# Patient Record
Sex: Female | Born: 1985 | Race: Black or African American | Hispanic: No | Marital: Single | Smoking: Never smoker
Health system: Southern US, Community
[De-identification: ages and names within clinical notes are randomized; demographics above are authoritative.]

## PROBLEM LIST (undated history)

## (undated) ENCOUNTER — Inpatient Hospital Stay (HOSPITAL_COMMUNITY): Payer: Self-pay

## (undated) DIAGNOSIS — R7303 Prediabetes: Secondary | ICD-10-CM

## (undated) DIAGNOSIS — O09299 Supervision of pregnancy with other poor reproductive or obstetric history, unspecified trimester: Secondary | ICD-10-CM

## (undated) DIAGNOSIS — O1490 Unspecified pre-eclampsia, unspecified trimester: Secondary | ICD-10-CM

## (undated) DIAGNOSIS — D649 Anemia, unspecified: Secondary | ICD-10-CM

---

## 2014-02-08 DIAGNOSIS — O1490 Unspecified pre-eclampsia, unspecified trimester: Secondary | ICD-10-CM

## 2014-02-08 DIAGNOSIS — A491 Streptococcal infection, unspecified site: Secondary | ICD-10-CM

## 2016-02-09 DIAGNOSIS — O1490 Unspecified pre-eclampsia, unspecified trimester: Secondary | ICD-10-CM

## 2016-02-09 DIAGNOSIS — Z9289 Personal history of other medical treatment: Secondary | ICD-10-CM

## 2016-02-09 HISTORY — DX: Unspecified pre-eclampsia, unspecified trimester: O14.90

## 2016-02-09 HISTORY — DX: Personal history of other medical treatment: Z92.89

## 2016-08-06 DIAGNOSIS — O1490 Unspecified pre-eclampsia, unspecified trimester: Secondary | ICD-10-CM

## 2018-02-08 NOTE — L&D Delivery Note (Signed)
Patient is a 33 y.o. now G4P5 s/p NSVD at [redacted]w[redacted]d, who was admitted for SOL, mild PEC. TOLAC.  She progressed with augmentation (Pitocin, AROM) to complete and pushed 13 minutes to deliver.  Cord clamping delayed by several minutes then clamped by CNM and cut by sister of patient.  Placenta intact and spontaneous, bleeding moderate.  1st degree laceration repaired without difficulty.  Mom and baby stable prior to transfer to postpartum. She plans on breastfeeding. She requests BTL for birth control.  Delivery Note At 8:45 PM a viable and healthy female was delivered via VBAC, Spontaneous (Presentation: LOA).  APGAR: 8, 9; weight pending.   Placenta intact and spontaneous, bleeding moderate. 3VCord: with no complications  Anesthesia:  Epidural Episiotomy: None Lacerations: 1st degree Suture Repair: 3.0 vicryl Est. Blood Loss (mL):  224mL  Mom to postpartum.  Baby to Couplet care / Skin to Skin.  Lajean Manes CNM 06/01/2018, 9:22 PM

## 2018-03-06 ENCOUNTER — Encounter (HOSPITAL_COMMUNITY): Payer: Self-pay

## 2018-03-06 ENCOUNTER — Other Ambulatory Visit: Payer: Self-pay

## 2018-03-06 ENCOUNTER — Emergency Department (HOSPITAL_COMMUNITY)
Admission: EM | Admit: 2018-03-06 | Discharge: 2018-03-07 | Disposition: A | Discharge status: Home / Self Care | Payer: Medicaid Other | Source: Home / Self Care

## 2018-03-06 DIAGNOSIS — O9989 Other specified diseases and conditions complicating pregnancy, childbirth and the puerperium: Secondary | ICD-10-CM

## 2018-03-06 DIAGNOSIS — Z87891 Personal history of nicotine dependence: Secondary | ICD-10-CM | POA: Insufficient documentation

## 2018-03-06 DIAGNOSIS — G44209 Tension-type headache, unspecified, not intractable: Secondary | ICD-10-CM | POA: Insufficient documentation

## 2018-03-06 DIAGNOSIS — R109 Unspecified abdominal pain: Secondary | ICD-10-CM | POA: Insufficient documentation

## 2018-03-06 DIAGNOSIS — Z3689 Encounter for other specified antenatal screening: Secondary | ICD-10-CM | POA: Diagnosis not present

## 2018-03-06 DIAGNOSIS — Z5321 Procedure and treatment not carried out due to patient leaving prior to being seen by health care provider: Secondary | ICD-10-CM | POA: Insufficient documentation

## 2018-03-06 DIAGNOSIS — Z3A25 25 weeks gestation of pregnancy: Secondary | ICD-10-CM | POA: Insufficient documentation

## 2018-03-06 DIAGNOSIS — G44201 Tension-type headache, unspecified, intractable: Secondary | ICD-10-CM | POA: Diagnosis not present

## 2018-03-06 DIAGNOSIS — O26892 Other specified pregnancy related conditions, second trimester: Secondary | ICD-10-CM | POA: Insufficient documentation

## 2018-03-06 LAB — BASIC METABOLIC PANEL
Anion gap: 9 (ref 5–15)
BUN: 8 mg/dL (ref 6–20)
CO2: 23 mmol/L (ref 22–32)
Calcium: 9.4 mg/dL (ref 8.9–10.3)
Chloride: 104 mmol/L (ref 98–111)
Creatinine, Ser: 0.71 mg/dL (ref 0.44–1.00)
GFR calc Af Amer: >60 mL/min (ref >60)
Glucose, Bld: 89 mg/dL (ref 70–99)
POTASSIUM: 4 mmol/L (ref 3.5–5.1)
Sodium: 136 mmol/L (ref 135–145)

## 2018-03-06 LAB — CBC
HCT: 30.7 % — ABNORMAL LOW (ref 36.0–46.0)
Hemoglobin: 9.8 g/dL — ABNORMAL LOW (ref 12.0–15.0)
MCH: 31.3 pg (ref 26.0–34.0)
MCHC: 31.9 g/dL (ref 30.0–36.0)
MCV: 98.1 fL (ref 80.0–100.0)
NRBC: 0 % (ref 0.0–0.2)
PLATELETS: 228 10*3/uL (ref 150–400)
RBC: 3.13 MIL/uL — AB (ref 3.87–5.11)
RDW: 14.7 % (ref 11.5–15.5)
WBC: 7.1 10*3/uL (ref 4.0–10.5)

## 2018-03-06 LAB — HCG, QUANTITATIVE, PREGNANCY: hCG, Beta Chain, Quant, S: 11123 m[IU]/mL — ABNORMAL HIGH (ref <5)

## 2018-03-06 NOTE — ED Triage Notes (Signed)
Pt is [redacted] wks pregnant with EDD 06/18/2018. G4 P3.  Hx of preeclampsia in previous pregnancies. Complaining of contraction 8 minutes apart starting at 11am.  Over the last 2 hours has gotten progressively worse. A&Ox4. Just moved from [place] and does not have OB MD yet.

## 2018-03-06 NOTE — ED Notes (Signed)
Pt stated wanted to go to [facility] hospital instead of waiting for [facility] nurse and room to open up. This NT insisted pt to stay that I couldn't guarantee nothing will happen right after walking into the parking lot. Pt stated they still wanted to leave, armband cut off.

## 2018-03-07 ENCOUNTER — Inpatient Hospital Stay (HOSPITAL_COMMUNITY)
Admission: AD | Admit: 2018-03-07 | Discharge: 2018-03-07 | Disposition: A | Discharge status: Home / Self Care | Payer: Medicaid Other | Attending: Obstetrics & Gynecology | Admitting: Obstetrics & Gynecology

## 2018-03-07 ENCOUNTER — Encounter (HOSPITAL_COMMUNITY): Payer: Self-pay

## 2018-03-07 DIAGNOSIS — O26899 Other specified pregnancy related conditions, unspecified trimester: Secondary | ICD-10-CM

## 2018-03-07 DIAGNOSIS — G44201 Tension-type headache, unspecified, intractable: Secondary | ICD-10-CM

## 2018-03-07 DIAGNOSIS — O26892 Other specified pregnancy related conditions, second trimester: Secondary | ICD-10-CM

## 2018-03-07 DIAGNOSIS — R109 Unspecified abdominal pain: Secondary | ICD-10-CM

## 2018-03-07 DIAGNOSIS — Z3689 Encounter for other specified antenatal screening: Secondary | ICD-10-CM

## 2018-03-07 DIAGNOSIS — Z3A25 25 weeks gestation of pregnancy: Secondary | ICD-10-CM

## 2018-03-07 HISTORY — DX: Unspecified pre-eclampsia, unspecified trimester: O14.90

## 2018-03-07 LAB — COMPREHENSIVE METABOLIC PANEL
ALT: 11 U/L (ref 0–44)
AST: 14 U/L — ABNORMAL LOW (ref 15–41)
Albumin: 3.4 g/dL — ABNORMAL LOW (ref 3.5–5.0)
Alkaline Phosphatase: 63 U/L (ref 38–126)
Anion gap: 6 (ref 5–15)
BUN: 9 mg/dL (ref 6–20)
CO2: 23 mmol/L (ref 22–32)
Calcium: 9 mg/dL (ref 8.9–10.3)
Chloride: 104 mmol/L (ref 98–111)
Creatinine, Ser: 0.69 mg/dL (ref 0.44–1.00)
GFR calc Af Amer: >60 mL/min (ref >60)
GFR calc non Af Amer: >60 mL/min (ref >60)
Glucose, Bld: 89 mg/dL (ref 70–99)
Potassium: 3.9 mmol/L (ref 3.5–5.1)
Sodium: 133 mmol/L — ABNORMAL LOW (ref 135–145)
Total Bilirubin: 0.6 mg/dL (ref 0.3–1.2)
Total Protein: 6.9 g/dL (ref 6.5–8.1)

## 2018-03-07 LAB — URINALYSIS, ROUTINE W REFLEX MICROSCOPIC
Bilirubin Urine: NEGATIVE
Glucose, UA: NEGATIVE mg/dL
Hgb urine dipstick: NEGATIVE
Ketones, ur: NEGATIVE mg/dL
Leukocytes, UA: NEGATIVE
Nitrite: NEGATIVE
Protein, ur: NEGATIVE mg/dL
Specific Gravity, Urine: 1.013 (ref 1.005–1.030)
pH: 6 (ref 5.0–8.0)

## 2018-03-07 MED ORDER — ACETAMINOPHEN 500 MG PO TABS
1000.0000 mg | ORAL_TABLET | Freq: Once | ORAL | Status: AC
  Administered 2018-03-07: 1000 mg via ORAL
  Filled 2018-03-07: qty 2

## 2018-03-07 MED ORDER — FERROUS SULFATE 325 (65 FE) MG PO TABS: 325.0000 mg | ORAL_TABLET | Freq: Every day | ORAL | 0 refills | Status: DC

## 2018-03-07 MED ORDER — NIFEDIPINE 10 MG PO CAPS
10.0000 mg | ORAL_CAPSULE | Freq: Once | ORAL | Status: AC
  Administered 2018-03-07: 10 mg via ORAL
  Filled 2018-03-07: qty 1

## 2018-03-07 MED ORDER — BUTALBITAL-APAP-CAFFEINE 50-325-40 MG PO TABS: 1.0000 | ORAL_TABLET | Freq: Four times a day (QID) | ORAL | 0 refills | Status: DC | PRN

## 2018-03-07 NOTE — MAU Provider Note (Signed)
History     CSN: [id number]  Arrival date and time: 03/07/18 7124   First Provider Initiated Contact with Patient 03/07/18 0041      Chief Complaint  Patient presents with  . Contractions  . Headache   Jennifer Booth is a 33 y.o. G3P3 at [redacted]w[redacted]d who presents to MAU with complaints of cramping and HA. She reports cramping started occurring this morning but has continued to increase over the day and she is unsure of whether cramping is contractions, rates pain 6/10- Has taken 500mg  of Tylenol around 2300 with no relief. She reports HA around 1900 with was associated with one occurrence of dizziness, rates pain 7/10. Hx of C/S with last pregnancy for twins- plans TOLAC. Receives prenatal care in [place], recently moved to [place] and has not started care. Denies vaginal bleeding or discharge. +FM. Was seen at [facility] but left to come to [facility].    OB History    Gravida  3   Para  2   Term  1   Preterm  1   AB  0   Living  3     SAB  0   TAB  0   Ectopic  0   Multiple  1   Live Births  3           Past Medical History:  Diagnosis Date  . Pre-eclampsia     Past Surgical History:  Procedure Laterality Date  . CESAREAN SECTION      No family history on file.  Social History   Tobacco Use  . Smoking status: Former Research scientist (life sciences)  . Smokeless tobacco: Never Used  Substance Use Topics  . Alcohol use: Not Currently  . Drug use: Never    Allergies:  Allergies  Allergen Reactions  . Zofran [Ondansetron Hcl] Anaphylaxis  . Ciprofloxacin     Medications Prior to Admission  Medication Sig Dispense Refill Last Dose  . acetaminophen (TYLENOL) 325 MG tablet Take 650 mg by mouth every 6 (six) hours as needed.   03/06/2018 at [place]  . Prenatal Vit-Fe Fumarate-FA (PRENATAL MULTIVITAMIN) TABS tablet Take 1 tablet by mouth daily at 12 noon.   03/06/2018 at Unknown time    Review of Systems  Constitutional: Negative.   Respiratory: Negative.   Cardiovascular:  Negative.   Gastrointestinal: Positive for abdominal pain. Negative for constipation, diarrhea, nausea and vomiting.  Genitourinary: Negative.   Neurological: Positive for dizziness and headaches. Negative for syncope, weakness and light-headedness.   Physical Exam   Blood pressure 105/65, pulse 87, temperature 98.4 F (36.9 C), temperature source Oral, resp. rate 20, height 5\' 5"  (1.651 m), weight 118.8 kg, SpO2 100 %.  Physical Exam  Nursing note and vitals reviewed. Constitutional: She is oriented to person, place, and time. She appears well-developed and well-nourished. No distress.  Cardiovascular: Normal rate, regular rhythm and normal heart sounds.  No murmur heard. Respiratory: Effort normal and breath sounds normal. No respiratory distress. She has no wheezes. She has no rales.  GI: Soft. She exhibits no distension. There is no abdominal tenderness. There is no rebound.  Gravid appropriate for gestational age   Musculoskeletal: Normal range of motion.        General: No edema.  Neurological: She is alert and oriented to person, place, and time.  Psychiatric: She has a normal mood and affect. Her behavior is normal. Thought content normal.   Dilation: Fingertip Effacement (%): Thick Cervical Position: Posterior Exam by:: Wende Bushy, CNM  FHR: 140/ moderate/ +accels/ no decelerations  Toco: no UC with some UI  MAU Course  Procedures  MDM Orders Placed This Encounter  Procedures  . Urinalysis, Routine w reflex microscopic  . Comprehensive metabolic panel   Meds ordered this encounter  Medications  . acetaminophen (TYLENOL) tablet 1,000 mg  . NIFEdipine (PROCARDIA) capsule 10 mg  . ferrous sulfate 325 (65 FE) MG tablet    Sig: Take 1 tablet (325 mg total) by mouth daily.    Dispense:  30 tablet    Refill:  0    Order Specific Question:   Supervising Provider    Answer:   Verita Schneiders A [[id number]]  . butalbital-acetaminophen-caffeine (FIORICET, ESGIC) 50-325-40 MG  tablet    Sig: Take 1-2 tablets by mouth every 6 (six) hours as needed for headache.    Dispense:  20 tablet    Refill:  0    Order Specific Question:   Supervising Provider    Answer:   Osborne Oman [[id number]]   Results for orders placed or performed during the hospital encounter of 03/07/18 (from the past 48 hour(s))  Urinalysis, Routine w reflex microscopic     Status: None   Collection Time: 03/07/18 12:17 AM  Result Value Ref Range   Color, Urine YELLOW YELLOW   APPearance CLEAR CLEAR   Specific Gravity, Urine 1.013 1.005 - 1.030   pH 6.0 5.0 - 8.0   Glucose, UA NEGATIVE NEGATIVE mg/dL   Hgb urine dipstick NEGATIVE NEGATIVE   Bilirubin Urine NEGATIVE NEGATIVE   Ketones, ur NEGATIVE NEGATIVE mg/dL   Protein, ur NEGATIVE NEGATIVE mg/dL   Nitrite NEGATIVE NEGATIVE   Leukocytes, UA NEGATIVE NEGATIVE    Comment: Performed at [facility], [address] [place], [place] [address]  Comprehensive metabolic panel     Status: Abnormal   Collection Time: 03/07/18 12:59 AM  Result Value Ref Range   Sodium 133 (L) 135 - 145 mmol/L   Potassium 3.9 3.5 - 5.1 mmol/L   Chloride 104 98 - 111 mmol/L   CO2 23 22 - 32 mmol/L   Glucose, Bld 89 70 - 99 mg/dL   BUN 9 6 - 20 mg/dL   Creatinine, Ser 0.69 0.44 - 1.00 mg/dL   Calcium 9.0 8.9 - 10.3 mg/dL   Total Protein 6.9 6.5 - 8.1 g/dL   Albumin 3.4 (L) 3.5 - 5.0 g/dL   AST 14 (L) 15 - 41 U/L   ALT 11 0 - 44 U/L   Alkaline Phosphatase 63 38 - 126 U/L   Total Bilirubin 0.6 0.3 - 1.2 mg/dL   GFR calc non Af Amer >60 >60 mL/min   GFR calc Af Amer >60 >60 mL/min   Anion gap 6 5 - 15    Comment: Performed at [facility], [address], [place], [place] [address]   CBC done at [facility] reviewed: hgb 9.8 Treatments in MAU included Tylenol 1000mg  and one dose of Procardia, patient drove self to hospital unable to give additional medication for HA.   Patient reports pain is relieved with medication treatment, reports having a hx of HA  prior to getting pregnant. Rx for fioricet and iron supplementation sent to pharmacy of choice. No uterine contractions noted on monitor. NST reactive for gestational age.  Educated and discussed reasons to return to MAU, message sent to [facility] to get scheduled for NOB/transfer appointment ASAP. Pt stable at time of discharge.  Assessment and Plan   1. Acute intractable tension-type headache  2. Abdominal cramping affecting pregnancy, antepartum   3. [redacted] weeks gestation of pregnancy   4. NST (non-stress test) reactive    Discharge home  Make appointment at [facility]- message sent  Return to MAU as needed Rx for iron supplementation and Fioricet   Follow-up [place]. Schedule an appointment as soon as possible for a visit.   Specialty:  Obstetrics and Gynecology Why:  Make appointment to be seen for prenatal care Contact information: [address], [place] [phone]         Allergies as of 03/07/2018      Reactions   Zofran Alvis Lemmings Hcl] Anaphylaxis   Ciprofloxacin       Medication List    TAKE these medications   acetaminophen 325 MG tablet Commonly known as:  TYLENOL Take 650 mg by mouth every 6 (six) hours as needed.   butalbital-acetaminophen-caffeine 50-325-40 MG tablet Commonly known as:  FIORICET, ESGIC Take 1-2 tablets by mouth every 6 (six) hours as needed for headache.   ferrous sulfate 325 (65 FE) MG tablet Take 1 tablet (325 mg total) by mouth daily.   prenatal multivitamin Tabs tablet Take 1 tablet by mouth daily at 12 noon.       Lajean Manes CNM 03/07/2018, 1:59 AM

## 2018-03-07 NOTE — Discharge Instructions (Signed)

## 2018-03-07 NOTE — MAU Note (Addendum)
Pt reports mild cramping that started this morning that has progressively worsened throughout the day. Reports contractions every 11 mins. Pt also reports HA and dizziness that started around 1900. Took tylenol for HA at 2030-did not help. History of pre-e with her last pregnancy. Pt denies LOF or vaginal bleeding. Reports good fetal movement. PNC received in [place]. Just moved here.

## 2018-03-08 ENCOUNTER — Telehealth: Payer: Self-pay | Admitting: *Deleted

## 2018-03-08 NOTE — Telephone Encounter (Signed)
Spoke with Patient's sister who stated she would pass along the message to sister about needing an appointment.

## 2018-03-16 ENCOUNTER — Telehealth: Payer: Self-pay | Admitting: Certified Nurse Midwife

## 2018-03-16 NOTE — Telephone Encounter (Signed)
Second attempt to reach patient to schedule prenatal care.  Patient is not in, and does not have voicemail.  Will continue to try back later to reach patient. Phone is a working number.

## 2018-03-25 ENCOUNTER — Encounter (HOSPITAL_COMMUNITY): Payer: Self-pay | Admitting: *Deleted

## 2018-03-25 ENCOUNTER — Inpatient Hospital Stay (HOSPITAL_COMMUNITY)
Admission: AD | Admit: 2018-03-25 | Discharge: 2018-03-25 | Disposition: A | Discharge status: Home / Self Care | Payer: Medicaid Other | Attending: Obstetrics and Gynecology | Admitting: Obstetrics and Gynecology

## 2018-03-25 DIAGNOSIS — Z87891 Personal history of nicotine dependence: Secondary | ICD-10-CM | POA: Diagnosis not present

## 2018-03-25 DIAGNOSIS — O4702 False labor before 37 completed weeks of gestation, second trimester: Secondary | ICD-10-CM

## 2018-03-25 DIAGNOSIS — Z3A27 27 weeks gestation of pregnancy: Secondary | ICD-10-CM | POA: Diagnosis not present

## 2018-03-25 DIAGNOSIS — R102 Pelvic and perineal pain: Secondary | ICD-10-CM | POA: Insufficient documentation

## 2018-03-25 DIAGNOSIS — O26892 Other specified pregnancy related conditions, second trimester: Secondary | ICD-10-CM | POA: Diagnosis not present

## 2018-03-25 DIAGNOSIS — M545 Low back pain: Secondary | ICD-10-CM

## 2018-03-25 LAB — URINALYSIS, ROUTINE W REFLEX MICROSCOPIC
Glucose, UA: NEGATIVE mg/dL
Ketones, ur: 15 mg/dL — AB
Nitrite: NEGATIVE
Protein, ur: 100 mg/dL — AB
Specific Gravity, Urine: 1.025 (ref 1.005–1.030)
pH: 6.5 (ref 5.0–8.0)

## 2018-03-25 LAB — URINALYSIS, MICROSCOPIC (REFLEX)

## 2018-03-25 MED ORDER — CYCLOBENZAPRINE HCL 10 MG PO TABS
10.0000 mg | ORAL_TABLET | Freq: Once | ORAL | Status: DC
  Filled 2018-03-25: qty 1

## 2018-03-25 MED ORDER — COMFORT FIT MATERNITY SUPP LG MISC: 1.0000 [IU] | Freq: Every day | 0 refills | Status: DC

## 2018-03-25 MED ORDER — CYCLOBENZAPRINE HCL 10 MG PO TABS: 10.0000 mg | ORAL_TABLET | Freq: Two times a day (BID) | ORAL | 0 refills | Status: DC | PRN

## 2018-03-25 MED ORDER — NIFEDIPINE 10 MG PO CAPS
10.0000 mg | ORAL_CAPSULE | ORAL | Status: AC | PRN
  Administered 2018-03-25 (×4): 10 mg via ORAL
  Filled 2018-03-25 (×4): qty 1

## 2018-03-25 NOTE — Discharge Instructions (Signed)

## 2018-03-25 NOTE — MAU Note (Signed)
Pt states she started having some contractions and lower back pain that started three hours ago, denies LOF.VB. No recent intercourse. Tried lying down, changing positions, drinking water but it did not help.

## 2018-03-25 NOTE — MAU Provider Note (Signed)
History     CSN: [id number]  Arrival date and time: 03/25/18 2035   First Provider Initiated Contact with Patient 03/25/18 2110      Chief Complaint  Patient presents with  . Contractions   Avalin Booth is a 33 y.o. G3P3 at [redacted]w[redacted]d who presents to MAU with complaints of contractions and back pain. She reports contractions and back pain started occurring 3 hours prior to arrival to MAU. She reports contractions occur every 5-10 minutes, started off as mild contractions then increased in intensity. Pain starts in abdomen then radiates around to her lower back. Rates pain 5/10- has not taken any medication for pain prior to presenting to MAU. Denies vaginal bleeding or discharge. +FM. Denies IC recently. Patient reports being scheduled for initial prenatal visit on upcoming Tuesday.    OB History    Gravida  3   Para  2   Term  1   Preterm  1   AB  0   Living  3     SAB  0   TAB  0   Ectopic  0   Multiple  1   Live Births  3           Past Medical History:  Diagnosis Date  . Pre-eclampsia     Past Surgical History:  Procedure Laterality Date  . CESAREAN SECTION      History reviewed. No pertinent family history.  Social History   Tobacco Use  . Smoking status: Former Research scientist (life sciences)  . Smokeless tobacco: Never Used  Substance Use Topics  . Alcohol use: Not Currently  . Drug use: Never    Allergies:  Allergies  Allergen Reactions  . Zofran [Ondansetron Hcl] Anaphylaxis  . Ciprofloxacin     Medications Prior to Admission  Medication Sig Dispense Refill Last Dose  . acetaminophen (TYLENOL) 325 MG tablet Take 650 mg by mouth every 6 (six) hours as needed.   03/06/2018 at [place]  . butalbital-acetaminophen-caffeine (FIORICET, ESGIC) 50-325-40 MG tablet Take 1-2 tablets by mouth every 6 (six) hours as needed for headache. 20 tablet 0   . ferrous sulfate 325 (65 FE) MG tablet Take 1 tablet (325 mg total) by mouth daily. 30 tablet 0   . Prenatal Vit-Fe  Fumarate-FA (PRENATAL MULTIVITAMIN) TABS tablet Take 1 tablet by mouth daily at 12 noon.   03/06/2018 at Unknown time    Review of Systems  Constitutional: Negative.   Respiratory: Negative.   Cardiovascular: Negative.   Gastrointestinal: Positive for abdominal pain. Negative for constipation, diarrhea, nausea and vomiting.       Contractions  Genitourinary: Negative.   Musculoskeletal: Positive for back pain.   Physical Exam   Blood pressure 130/76, pulse (!) 111, temperature 98.1 F (36.7 C), temperature source Oral, resp. rate 16, SpO2 98 %.  Physical Exam  Nursing note and vitals reviewed. Constitutional: She is oriented to person, place, and time. She appears well-developed and well-nourished. No distress.  Cardiovascular: Normal rate, regular rhythm and normal heart sounds.  Respiratory: Effort normal and breath sounds normal. No respiratory distress. She has no wheezes. She has no rales.  GI: Soft. There is no abdominal tenderness. There is no rebound and no guarding.  Gravid appropriate for gestational age, mild uterine contractions palpated  Genitourinary:    No vaginal discharge.   Musculoskeletal: Normal range of motion.        General: No edema.  Neurological: She is alert and oriented to person, place, and time.  Psychiatric:  She has a normal mood and affect. Her behavior is normal. Thought content normal.   Dilation: Fingertip Effacement (%): Thick Cervical Position: Posterior Exam by:: V Stann Mainland CNM  (cervical examination same as before when seen in MAU on 1/28)  FHR:140/ moderate/ +accels/ no decelerations  Toco: 4-5 with UI, mild by palpation   MAU Course  Procedures  MDM Hydration  FFN collected not sent due to no change in cervical exam from previous evaluation   Meds ordered this encounter  Medications  . NIFEdipine (PROCARDIA) capsule 10 mg  . DISCONTD: cyclobenzaprine (FLEXERIL) tablet 10 mg  . cyclobenzaprine (FLEXERIL) 10 MG tablet    Sig:  Take 1 tablet (10 mg total) by mouth 2 (two) times daily as needed for muscle spasms.    Dispense:  20 tablet    Refill:  0    Order Specific Question:   Supervising Provider    Answer:   Jonnie Kind [[id number]]  . Elastic Bandages & Supports (COMFORT FIT MATERNITY SUPP LG) MISC    Sig: 1 Units by Does not apply route daily.    Dispense:  1 each    Refill:  0    Order Specific Question:   Supervising Provider    Answer:   Jonnie Kind [[id number]]   Treatments in MAU included Procardia x4, unable to give Flexeril due to patient driving self home.   Patient reports continued pelvic pain and back pain after treatment, rates pain 2/10. No change in cervical examination upon reassessment. Rx for Flexeril and maternity support belt to relieve pelvic pain and back pain. Occasional mild contractions palpated prior to discharge home.   Discussed reasons to return to MAU. Hydration and pelvic rest at home. Increase amount of water she consumes to 8-10 cups per day instead of 4 she consumed today. Follow up as scheduled for prenatal appointment on Tuesday. Pt stable at time of discharge.   Assessment and Plan   1. Preterm uterine contractions in second trimester, antepartum   2. [redacted] weeks gestation of pregnancy   3. Pelvic pain affecting pregnancy in second trimester, antepartum   4. Low back pain during pregnancy in second trimester    Discharge home Hydration, rest and fetal kick counts  Rx for Flexeril and maternity support belt  Return to MAU as needed Follow up as scheduled for prenatal appointment  Allergies as of 03/25/2018      Reactions   Zofran [ondansetron Hcl] Anaphylaxis   Ciprofloxacin       Medication List    TAKE these medications   acetaminophen 325 MG tablet Commonly known as:  TYLENOL Take 650 mg by mouth every 6 (six) hours as needed.   butalbital-acetaminophen-caffeine 50-325-40 MG tablet Commonly known as:  FIORICET, ESGIC Take 1-2 tablets by mouth every 6 (six)  hours as needed for headache.   COMFORT FIT MATERNITY SUPP LG Misc 1 Units by Does not apply route daily.   cyclobenzaprine 10 MG tablet Commonly known as:  FLEXERIL Take 1 tablet (10 mg total) by mouth 2 (two) times daily as needed for muscle spasms.   ferrous sulfate 325 (65 FE) MG tablet Take 1 tablet (325 mg total) by mouth daily.   prenatal multivitamin Tabs tablet Take 1 tablet by mouth daily at 12 noon.      Lajean Manes CNM 03/25/2018, 11:20 PM

## 2018-04-05 ENCOUNTER — Encounter (HOSPITAL_COMMUNITY): Payer: Self-pay

## 2018-04-05 ENCOUNTER — Inpatient Hospital Stay (HOSPITAL_COMMUNITY)
Admission: AD | Admit: 2018-04-05 | Discharge: 2018-04-05 | Disposition: A | Discharge status: Home / Self Care | Payer: Medicaid Other | Attending: Obstetrics & Gynecology | Admitting: Obstetrics & Gynecology

## 2018-04-05 ENCOUNTER — Other Ambulatory Visit: Payer: Self-pay

## 2018-04-05 DIAGNOSIS — O9A213 Injury, poisoning and certain other consequences of external causes complicating pregnancy, third trimester: Secondary | ICD-10-CM | POA: Diagnosis not present

## 2018-04-05 DIAGNOSIS — X58XXXA Exposure to other specified factors, initial encounter: Secondary | ICD-10-CM | POA: Diagnosis not present

## 2018-04-05 DIAGNOSIS — S334XXA Traumatic rupture of symphysis pubis, initial encounter: Secondary | ICD-10-CM | POA: Insufficient documentation

## 2018-04-05 DIAGNOSIS — Z3A29 29 weeks gestation of pregnancy: Secondary | ICD-10-CM | POA: Diagnosis not present

## 2018-04-05 DIAGNOSIS — O26893 Other specified pregnancy related conditions, third trimester: Secondary | ICD-10-CM | POA: Diagnosis not present

## 2018-04-05 DIAGNOSIS — R109 Unspecified abdominal pain: Secondary | ICD-10-CM | POA: Diagnosis not present

## 2018-04-05 LAB — URINALYSIS, ROUTINE W REFLEX MICROSCOPIC
Bilirubin Urine: NEGATIVE
Glucose, UA: NEGATIVE mg/dL
Hgb urine dipstick: NEGATIVE
Ketones, ur: 20 mg/dL — AB
Nitrite: NEGATIVE
PH: 6 (ref 5.0–8.0)
Protein, ur: 30 mg/dL — AB
Specific Gravity, Urine: 1.021 (ref 1.005–1.030)

## 2018-04-05 LAB — WET PREP, GENITAL
Clue Cells Wet Prep HPF POC: NONE SEEN
Sperm: NONE SEEN
Trich, Wet Prep: NONE SEEN
Yeast Wet Prep HPF POC: NONE SEEN

## 2018-04-05 MED ORDER — COMFORT FIT MATERNITY SUPP LG MISC: 1.0000 | 0 refills | Status: DC | PRN

## 2018-04-05 NOTE — MAU Note (Signed)
Pt states that yesterday when she was talking a shower she noticed her mucous plus came out.   She reports extreme pelvic and vaginal pressure and ctx's for the last two hours.    Denies vaginal bleeding or LOF.   Reports +FM

## 2018-04-05 NOTE — Discharge Instructions (Signed)

## 2018-04-05 NOTE — MAU Provider Note (Signed)
History     CSN: [id number]  Arrival date and time: 04/05/18 1640   First Provider Initiated Contact with Patient 04/05/18 1715      Chief Complaint  Patient presents with  . Contractions  . Pelvic Pain   Jennifer Booth is a 33 y.o.  [id number] at [redacted]w[redacted]d who presents today with pelvic pain and discharge. She states that last night she lost her mucous plug and today she has been having q 7 min contractions. She denies any VB or LOF. She reports normal fetal movement. Patient reports that the pain is directly over her pubic bone and is worse with any movement.   Pelvic Pain  The patient's primary symptoms include pelvic pain. This is a new problem. The current episode started yesterday. The problem occurs intermittently. The problem has been unchanged. Pain severity now: 7/10. She is pregnant. The vaginal discharge was mucoid. There has been no bleeding. Nothing aggravates the symptoms. She has tried nothing for the symptoms. Sexual activity: Patient denies intercourse in the last 24 hours.     OB History    Gravida  4   Para  3   Term  2   Preterm  1   AB  0   Living  4     SAB  0   TAB  0   Ectopic  0   Multiple  1   Live Births  4           Past Medical History:  Diagnosis Date  . Pre-eclampsia     Past Surgical History:  Procedure Laterality Date  . CESAREAN SECTION      History reviewed. No pertinent family history.  Social History   Tobacco Use  . Smoking status: Former Research scientist (life sciences)  . Smokeless tobacco: Never Used  Substance Use Topics  . Alcohol use: Not Currently  . Drug use: Never    Allergies:  Allergies  Allergen Reactions  . Zofran [Ondansetron Hcl] Anaphylaxis  . Ciprofloxacin     Medications Prior to Admission  Medication Sig Dispense Refill Last Dose  . Elastic Bandages & Supports (COMFORT FIT MATERNITY SUPP LG) MISC 1 Units by Does not apply route daily. 1 each 0 04/05/2018 at Unknown time  . ferrous sulfate 325 (65 FE) MG tablet  Take 1 tablet (325 mg total) by mouth daily. 30 tablet 0 04/05/2018 at Unknown time  . Prenatal Vit-Fe Fumarate-FA (PRENATAL MULTIVITAMIN) TABS tablet Take 1 tablet by mouth daily at 12 noon.   04/05/2018 at Unknown time  . acetaminophen (TYLENOL) 325 MG tablet Take 650 mg by mouth every 6 (six) hours as needed.   03/06/2018 at [place]  . butalbital-acetaminophen-caffeine (FIORICET, ESGIC) 50-325-40 MG tablet Take 1-2 tablets by mouth every 6 (six) hours as needed for headache. 20 tablet 0   . cyclobenzaprine (FLEXERIL) 10 MG tablet Take 1 tablet (10 mg total) by mouth 2 (two) times daily as needed for muscle spasms. 20 tablet 0     Review of Systems  Genitourinary: Positive for pelvic pain.   Physical Exam   Blood pressure 130/73, pulse (!) 105, temperature 98.7 F (37.1 C), resp. rate 16, weight 115.5 kg, SpO2 97 %.  Physical Exam  Nursing note and vitals reviewed. Constitutional: She is oriented to person, place, and time. She appears well-developed and well-nourished. No distress.  HENT:  Head: Normocephalic.  Cardiovascular: Normal rate.  Respiratory: Effort normal.  GI: Soft. There is no abdominal tenderness. There is no rebound.  Genitourinary:  Genitourinary Comments: Cervix: FT/thick/ballotable, unchanged from last visit.    Neurological: She is alert and oriented to person, place, and time.  Skin: Skin is warm and dry.  Psychiatric: She has a normal mood and affect.    NST:  Baseline: 140 Variability: moderate Accels: 15x15 Decels: none Toco: rare, some UI  Results for orders placed or performed during the hospital encounter of 04/05/18 (from the past 24 hour(s))  Urinalysis, Routine w reflex microscopic     Status: Abnormal   Collection Time: 04/05/18  4:51 PM  Result Value Ref Range   Color, Urine YELLOW YELLOW   APPearance HAZY (A) CLEAR   Specific Gravity, Urine 1.021 1.005 - 1.030   pH 6.0 5.0 - 8.0   Glucose, UA NEGATIVE NEGATIVE mg/dL   Hgb urine dipstick  NEGATIVE NEGATIVE   Bilirubin Urine NEGATIVE NEGATIVE   Ketones, ur 20 (A) NEGATIVE mg/dL   Protein, ur 30 (A) NEGATIVE mg/dL   Nitrite NEGATIVE NEGATIVE   Leukocytes,Ua TRACE (A) NEGATIVE   RBC / HPF 0-5 0 - 5 RBC/hpf   WBC, UA 11-20 0 - 5 WBC/hpf   Bacteria, UA RARE (A) NONE SEEN   Squamous Epithelial / LPF 11-20 0 - 5   Mucus PRESENT   Wet prep, genital     Status: Abnormal   Collection Time: 04/05/18  5:35 PM  Result Value Ref Range   Yeast Wet Prep HPF POC NONE SEEN NONE SEEN   Trich, Wet Prep NONE SEEN NONE SEEN   Clue Cells Wet Prep HPF POC NONE SEEN NONE SEEN   WBC, Wet Prep HPF POC FEW (A) NONE SEEN   Sperm NONE SEEN     MAU Course  Procedures  MDM   Assessment and Plan   1. Dislocation of symphysis pubis, initial encounter   2. [redacted] weeks gestation of pregnancy     DC home Comfort measures reviewed  3rd Trimester precautions  PTL precautions  Fetal kick counts RX: maternity support belt Return to MAU as needed FU with OB as planned  Follow-up Information    [place] Follow up.   Contact information: [address] [place] [phone] [place] DNP, CNM  04/05/18  6:21 PM

## 2018-04-07 LAB — GC/CHLAMYDIA PROBE AMP (~~LOC~~) NOT AT ARMC
Chlamydia: NEGATIVE
Neisseria Gonorrhea: NEGATIVE

## 2018-04-18 DIAGNOSIS — Z3480 Encounter for supervision of other normal pregnancy, unspecified trimester: Secondary | ICD-10-CM | POA: Insufficient documentation

## 2018-04-19 ENCOUNTER — Other Ambulatory Visit (HOSPITAL_COMMUNITY)
Admission: RE | Admit: 2018-04-19 | Discharge: 2018-04-19 | Disposition: A | Discharge status: Home / Self Care | Payer: Medicaid Other | Source: Ambulatory Visit | Attending: Obstetrics | Admitting: Obstetrics

## 2018-04-19 ENCOUNTER — Ambulatory Visit (INDEPENDENT_AMBULATORY_CARE_PROVIDER_SITE_OTHER): Payer: Medicaid Other | Admitting: Obstetrics

## 2018-04-19 ENCOUNTER — Other Ambulatory Visit: Payer: Self-pay

## 2018-04-19 ENCOUNTER — Encounter: Payer: Self-pay | Admitting: Obstetrics

## 2018-04-19 VITALS — BP 124/82 | HR 105 | Wt 255.0 lb

## 2018-04-19 DIAGNOSIS — Z3A31 31 weeks gestation of pregnancy: Secondary | ICD-10-CM | POA: Diagnosis not present

## 2018-04-19 DIAGNOSIS — O0933 Supervision of pregnancy with insufficient antenatal care, third trimester: Secondary | ICD-10-CM | POA: Insufficient documentation

## 2018-04-19 DIAGNOSIS — Z3480 Encounter for supervision of other normal pregnancy, unspecified trimester: Secondary | ICD-10-CM | POA: Insufficient documentation

## 2018-04-19 DIAGNOSIS — Z3483 Encounter for supervision of other normal pregnancy, third trimester: Secondary | ICD-10-CM | POA: Diagnosis not present

## 2018-04-19 DIAGNOSIS — Z8751 Personal history of pre-term labor: Secondary | ICD-10-CM | POA: Insufficient documentation

## 2018-04-19 DIAGNOSIS — Z36 Encounter for antenatal screening for chromosomal anomalies: Secondary | ICD-10-CM | POA: Diagnosis not present

## 2018-04-19 DIAGNOSIS — Z8759 Personal history of other complications of pregnancy, childbirth and the puerperium: Secondary | ICD-10-CM | POA: Insufficient documentation

## 2018-04-19 NOTE — Progress Notes (Signed)
Pt presents for NOB. This is not a planned and FOB is involved.  Pt c/o dizziness, lightheadedness, and c/o pelvic pressure.

## 2018-04-19 NOTE — Progress Notes (Signed)
Subjective:    Jennifer Booth is being seen today for her first obstetrical visit.  This is not a planned pregnancy. She is at [redacted]w[redacted]d gestation. Her obstetrical history is significant for pre-eclampsia, obesity and dislocation of symphysis. Relationship with FOB: significant other, not living together. Patient does intend to breast feed. Pregnancy history fully reviewed.  The information documented in the HPI was reviewed and verified.  Menstrual History: OB History    Gravida  4   Para  3   Term  2   Preterm  1   AB  0   Living  4     SAB  0   TAB  0   Ectopic  0   Multiple  1   Live Births  4            No LMP recorded. Patient is pregnant.    Past Medical History:  Diagnosis Date  . Pre-eclampsia     Past Surgical History:  Procedure Laterality Date  . CESAREAN SECTION      (Not in a hospital admission)  Allergies  Allergen Reactions  . Zofran [Ondansetron Hcl] Anaphylaxis  . Ciprofloxacin     Social History   Tobacco Use  . Smoking status: Former Research scientist (life sciences)  . Smokeless tobacco: Never Used  Substance Use Topics  . Alcohol use: Not Currently    Family History  Problem Relation Age of Onset  . Hypertension Mother   . Diabetes Mother   . Hypercholesterolemia Father   . Hypertension Father   . Heart disease Maternal Grandmother   . Diabetes Maternal Grandmother      Review of Systems Constitutional: negative for weight loss Gastrointestinal: negative for vomiting Genitourinary:negative for genital lesions and vaginal discharge and dysuria Musculoskeletal:negative for back pain Behavioral/Psych: negative for abusive relationship, depression, illegal drug usage and tobacco use    Objective:    BP 124/82   Pulse (!) 105   Wt 255 lb (115.7 kg)   BMI 42.43 kg/m  General Appearance:    Alert, cooperative, no distress, appears stated age  Head:    Normocephalic, without obvious abnormality, atraumatic  Eyes:    PERRL, conjunctiva/corneas  clear, EOM's intact, fundi    benign, both eyes  Ears:    Normal TM's and external ear canals, both ears  Nose:   Nares normal, septum midline, mucosa normal, no drainage    or sinus tenderness  Throat:   Lips, mucosa, and tongue normal; teeth and gums normal  Neck:   Supple, symmetrical, trachea midline, no adenopathy;    thyroid:  no enlargement/tenderness/nodules; no carotid   bruit or JVD  Back:     Symmetric, no curvature, ROM normal, no CVA tenderness  Lungs:     Clear to auscultation bilaterally, respirations unlabored  Chest Wall:    No tenderness or deformity   Heart:    Regular rate and rhythm, S1 and S2 normal, no murmur, rub   or gallop  Breast Exam:    No tenderness, masses, or nipple abnormality  Abdomen:     Soft, non-tender, bowel sounds active all four quadrants,    no masses, no organomegaly  Genitalia:    Normal female without lesion, discharge or tenderness  Extremities:   Extremities normal, atraumatic, no cyanosis or edema  Pulses:   2+ and symmetric all extremities  Skin:   Skin color, texture, turgor normal, no rashes or lesions  Lymph nodes:   Cervical, supraclavicular, and axillary nodes normal  Neurologic:  CNII-XII intact, normal strength, sensation and reflexes    throughout      Lab Review Urine pregnancy test Labs reviewed yes Radiologic studies reviewed yes  Assessment:    Pregnancy at [redacted]w[redacted]d weeks    Plan:   1. Supervision of other normal pregnancy, antepartum Rx: - Obstetric Panel, Including HIV - Culture, OB Urine - Genetic Screening - [facility] AMB BABYSCRIPTS OPT IN - Cervicovaginal ancillary only( [facility]) - Cytology - PAP( [place]) - [place] MFM OB DETAIL +14 WK; Future - Hemoglobin A1c    Prenatal vitamins.  Counseling provided regarding continued use of seat belts, cessation of alcohol consumption, smoking or use of illicit drugs; infection precautions i.e., influenza/TDAP immunizations, toxoplasmosis,CMV, parvovirus, listeria and  varicella; workplace safety, exercise during pregnancy; routine dental care, safe medications, sexual activity, hot tubs, saunas, pools, travel, caffeine use, fish and methlymercury, potential toxins, hair treatments, varicose veins Weight gain recommendations per IOM guidelines reviewed: underweight/BMI< 18.5--> gain 28 - 40 lbs; normal weight/BMI 18.5 - 24.9--> gain 25 - 35 lbs; overweight/BMI 25 - 29.9--> gain 15 - 25 lbs; obese/BMI >30->gain  11 - 20 lbs Problem list reviewed and updated. FIRST/CF mutation testing/NIPT/QUAD SCREEN/fragile X/Ashkenazi Jewish population testing/Spinal muscular atrophy discussed: done elsewhere. Role of ultrasound in pregnancy discussed; fetal survey: requested. Amniocentesis discussed: not indicated. VBAC calculator score: VBAC consent form provided No orders of the defined types were placed in this encounter.  Orders Placed This Encounter  Procedures  . Culture, OB Urine  . [place] MFM OB DETAIL +14 WK    Standing Status:   Future    Standing Expiration Date:   06/19/2019    Order Specific Question:   Reason for Exam (SYMPTOM  OR DIAGNOSIS REQUIRED)    Answer:   Anatomy    Order Specific Question:   Preferred Location    Answer:   [facility]  . Obstetric Panel, Including HIV  . Genetic Screening    [place]  . Hemoglobin A1c    Follow up in 2 weeks. 50% of 20 min visit spent on counseling and coordination of care.     Shelly Bombard MD 04-19-2018

## 2018-04-20 LAB — OBSTETRIC PANEL, INCLUDING HIV
Antibody Screen: NEGATIVE
Basophils Absolute: 0 10*3/uL (ref 0.0–0.2)
Basos: 0 %
EOS (ABSOLUTE): 0.1 10*3/uL (ref 0.0–0.4)
Eos: 1 %
HIV Screen 4th Generation wRfx: NONREACTIVE
Hematocrit: 27.9 % — ABNORMAL LOW (ref 34.0–46.6)
Hemoglobin: 9.8 g/dL — ABNORMAL LOW (ref 11.1–15.9)
Hepatitis B Surface Ag: NEGATIVE
Immature Grans (Abs): 0 10*3/uL (ref 0.0–0.1)
Immature Granulocytes: 1 %
Lymphocytes Absolute: 2 10*3/uL (ref 0.7–3.1)
Lymphs: 31 %
MCH: 32.8 pg (ref 26.6–33.0)
MCHC: 35.1 g/dL (ref 31.5–35.7)
MCV: 93 fL (ref 79–97)
Monocytes Absolute: 0.6 10*3/uL (ref 0.1–0.9)
Monocytes: 9 %
Neutrophils Absolute: 3.7 10*3/uL (ref 1.4–7.0)
Neutrophils: 58 %
Platelets: 227 10*3/uL (ref 150–450)
RBC: 2.99 x10E6/uL — ABNORMAL LOW (ref 3.77–5.28)
RDW: 13.8 % (ref 11.7–15.4)
RPR: NONREACTIVE
Rh Factor: POSITIVE
Rubella Antibodies, IGG: 2.02 index (ref >0.99)
WBC: 6.5 10*3/uL (ref 3.4–10.8)

## 2018-04-20 LAB — CERVICOVAGINAL ANCILLARY ONLY
Bacterial vaginitis: POSITIVE — AB
Candida vaginitis: NEGATIVE
Chlamydia: NEGATIVE
Neisseria Gonorrhea: NEGATIVE
TRICH (WINDOWPATH): NEGATIVE

## 2018-04-20 LAB — HEMOGLOBIN A1C
Est. average glucose Bld gHb Est-mCnc: 105 mg/dL
HEMOGLOBIN A1C: 5.3 % (ref 4.8–5.6)

## 2018-04-21 ENCOUNTER — Other Ambulatory Visit: Payer: Self-pay | Admitting: Obstetrics

## 2018-04-21 DIAGNOSIS — B9689 Other specified bacterial agents as the cause of diseases classified elsewhere: Secondary | ICD-10-CM

## 2018-04-21 DIAGNOSIS — N760 Acute vaginitis: Principal | ICD-10-CM

## 2018-04-21 LAB — CYTOLOGY - PAP
Adequacy: ABSENT — AB
DIAGNOSIS: UNDETERMINED — AB
HPV 16/18/45 genotyping: NEGATIVE
HPV: DETECTED — AB

## 2018-04-21 MED ORDER — TINIDAZOLE 500 MG PO TABS: 1000.0000 mg | ORAL_TABLET | Freq: Every day | ORAL | 2 refills | Status: DC

## 2018-04-23 ENCOUNTER — Inpatient Hospital Stay (HOSPITAL_BASED_OUTPATIENT_CLINIC_OR_DEPARTMENT_OTHER): Payer: Medicaid Other

## 2018-04-23 ENCOUNTER — Encounter (HOSPITAL_COMMUNITY): Payer: Self-pay

## 2018-04-23 ENCOUNTER — Other Ambulatory Visit: Payer: Self-pay

## 2018-04-23 ENCOUNTER — Observation Stay (HOSPITAL_COMMUNITY)
Admission: AD | Admit: 2018-04-23 | Discharge: 2018-04-24 | Disposition: A | Discharge status: Home / Self Care | Payer: Medicaid Other | Attending: Obstetrics & Gynecology | Admitting: Obstetrics & Gynecology

## 2018-04-23 DIAGNOSIS — Z79899 Other long term (current) drug therapy: Secondary | ICD-10-CM | POA: Insufficient documentation

## 2018-04-23 DIAGNOSIS — Z3A32 32 weeks gestation of pregnancy: Secondary | ICD-10-CM

## 2018-04-23 DIAGNOSIS — T1490XA Injury, unspecified, initial encounter: Secondary | ICD-10-CM

## 2018-04-23 DIAGNOSIS — Z87891 Personal history of nicotine dependence: Secondary | ICD-10-CM | POA: Insufficient documentation

## 2018-04-23 DIAGNOSIS — Y939 Activity, unspecified: Secondary | ICD-10-CM | POA: Insufficient documentation

## 2018-04-23 DIAGNOSIS — O9989 Other specified diseases and conditions complicating pregnancy, childbirth and the puerperium: Principal | ICD-10-CM | POA: Insufficient documentation

## 2018-04-23 DIAGNOSIS — O9A213 Injury, poisoning and certain other consequences of external causes complicating pregnancy, third trimester: Secondary | ICD-10-CM

## 2018-04-23 DIAGNOSIS — S3991XA Unspecified injury of abdomen, initial encounter: Secondary | ICD-10-CM | POA: Insufficient documentation

## 2018-04-23 DIAGNOSIS — Y929 Unspecified place or not applicable: Secondary | ICD-10-CM | POA: Diagnosis not present

## 2018-04-23 DIAGNOSIS — W109XXA Fall (on) (from) unspecified stairs and steps, initial encounter: Secondary | ICD-10-CM | POA: Insufficient documentation

## 2018-04-23 DIAGNOSIS — Y999 Unspecified external cause status: Secondary | ICD-10-CM | POA: Insufficient documentation

## 2018-04-23 DIAGNOSIS — Z3480 Encounter for supervision of other normal pregnancy, unspecified trimester: Secondary | ICD-10-CM

## 2018-04-23 LAB — URINALYSIS, ROUTINE W REFLEX MICROSCOPIC
Bilirubin Urine: NEGATIVE
Glucose, UA: NEGATIVE mg/dL
Hgb urine dipstick: NEGATIVE
Ketones, ur: NEGATIVE mg/dL
Leukocytes,Ua: NEGATIVE
Nitrite: NEGATIVE
Protein, ur: 30 mg/dL — AB
Specific Gravity, Urine: 1.025 (ref 1.005–1.030)
pH: 6 (ref 5.0–8.0)

## 2018-04-23 LAB — TYPE AND SCREEN
ABO/RH(D): O POS
Antibody Screen: NEGATIVE

## 2018-04-23 MED ORDER — LACTATED RINGERS IV BOLUS
1000.0000 mL | Freq: Once | INTRAVENOUS | Status: AC
  Administered 2018-04-23: 1000 mL via INTRAVENOUS

## 2018-04-23 MED ORDER — DOCUSATE SODIUM 100 MG PO CAPS
100.0000 mg | ORAL_CAPSULE | Freq: Every day | ORAL | Status: DC
  Administered 2018-04-24: 100 mg via ORAL
  Filled 2018-04-23: qty 1

## 2018-04-23 MED ORDER — LACTATED RINGERS IV SOLN
INTRAVENOUS | Status: DC
  Administered 2018-04-23 – 2018-04-24 (×4): via INTRAVENOUS

## 2018-04-23 MED ORDER — CALCIUM CARBONATE ANTACID 500 MG PO CHEW: 2.0000 | CHEWABLE_TABLET | ORAL | Status: DC | PRN

## 2018-04-23 MED ORDER — ACETAMINOPHEN 325 MG PO TABS: 650.0000 mg | ORAL_TABLET | ORAL | Status: DC | PRN

## 2018-04-23 MED ORDER — ZOLPIDEM TARTRATE 5 MG PO TABS: 5.0000 mg | ORAL_TABLET | Freq: Every evening | ORAL | Status: DC | PRN

## 2018-04-23 MED ORDER — PRENATAL MULTIVITAMIN CH
1.0000 | ORAL_TABLET | Freq: Every day | ORAL | Status: DC
  Administered 2018-04-24: 1 via ORAL
  Filled 2018-04-23: qty 1

## 2018-04-23 NOTE — MAU Note (Signed)
Earlier this morning, was coming into the house, missed a step and fell. [place] forward, landed on hands and stomach.  Ever since the fall, has been feeling contractions and pelvic pressure. no bleeding or leaking.

## 2018-04-23 NOTE — MAU Provider Note (Signed)
Chief Complaint:  Abdominal Pain and Fall   First Provider Initiated Contact with Patient 04/23/18 1406     HPI: Jennifer Booth is a 33 y.o. [id number] at [redacted]w[redacted]d who presents to maternity admissions reporting abdominal & back pain after a fall. [place] this morning when she missed a step. Landed on her hands & abdomen. Reports intermittent low abdomen & back pain since then. Denies LOF or vaginal bleeding. Normal fetal movement.  Location: abdomen & back Quality: contractions, sharp, pressure Severity: 7/10 in pain scale Duration: after fall Timing: intermittent Modifying factors: none Associated signs and symptoms: none  Past Medical History:  Diagnosis Date  . Pre-eclampsia    OB History  Gravida Para Term Preterm AB Living  4 3 2 1  0 4  SAB TAB Ectopic Multiple Live Births  0 0 0 1 4    # Outcome Date GA Lbr Len/2nd Weight Sex Delivery Anes PTL Lv  4 Current           3A Preterm 08/06/16 [redacted]w[redacted]d    CS-LTranv   LIV     Complications: Preeclampsia  3B Preterm 08/06/16 [redacted]w[redacted]d    CS-LTranv     2 Term 2016 [redacted]w[redacted]d    Vag-Spont   LIV  1 Term 2007 [redacted]w[redacted]d    Vag-Spont   LIV   Past Surgical History:  Procedure Laterality Date  . CESAREAN SECTION     Family History  Problem Relation Age of Onset  . Hypertension Mother   . Diabetes Mother   . Hypercholesterolemia Father   . Hypertension Father   . Heart disease Maternal Grandmother   . Diabetes Maternal Grandmother    Social History   Tobacco Use  . Smoking status: Former Research scientist (life sciences)  . Smokeless tobacco: Never Used  Substance Use Topics  . Alcohol use: Not Currently  . Drug use: Never   Allergies  Allergen Reactions  . Zofran [Ondansetron Hcl] Anaphylaxis  . Ciprofloxacin    Medications Prior to Admission  Medication Sig Dispense Refill Last Dose  . Elastic Bandages & Supports (COMFORT FIT MATERNITY SUPP LG) MISC 1 Device by Does not apply route as needed. 1 each 0 Taking  . ferrous sulfate 325 (65 FE) MG tablet Take 1 tablet  (325 mg total) by mouth daily. 30 tablet 0 Taking  . Prenatal Vit-Fe Fumarate-FA (PRENATAL MULTIVITAMIN) TABS tablet Take 1 tablet by mouth daily at 12 noon.   Taking  . tinidazole (TINDAMAX) 500 MG tablet Take 2 tablets (1,000 mg total) by mouth daily with breakfast. 10 tablet 2     I have reviewed patient's Past Medical Hx, Surgical Hx, Family Hx, Social Hx, medications and allergies.   ROS:  Review of Systems  Constitutional: Negative.   Gastrointestinal: Positive for abdominal pain.  Genitourinary: Negative.   Musculoskeletal: Positive for back pain.    Physical Exam   Patient Vitals for the past 24 hrs:  BP Temp Temp src Pulse Resp SpO2 Weight  04/23/18 1311 111/75 98.3 F (36.8 C) Oral 93 18 100 % 115.1 kg    Constitutional: Well-developed, well-nourished female in no acute distress.  Cardiovascular: normal rate & rhythm, no murmur Respiratory: normal effort, lung sounds clear throughout GI: Abd soft, non-tender, gravid appropriate for gestational age. No bruising. Ctx palpate mild.  MS: Extremities nontender, no edema, normal ROM Neurologic: Alert and oriented x 4.  GU:      Dilation: Closed Effacement (%): Thick Cervical Position: Anterior Exam by:: Jorje Guild NP   NST:  Baseline: 145 bpm, Variability: Good {> 6 bpm), Accelerations: Reactive and Decelerations: Absent   Labs: Results for orders placed or performed during the hospital encounter of 04/23/18 (from the past 24 hour(s))  Urinalysis, Routine w reflex microscopic     Status: Abnormal   Collection Time: 04/23/18  1:17 PM  Result Value Ref Range   Color, Urine YELLOW YELLOW   APPearance HAZY (A) CLEAR   Specific Gravity, Urine 1.025 1.005 - 1.030   pH 6.0 5.0 - 8.0   Glucose, UA NEGATIVE NEGATIVE mg/dL   Hgb urine dipstick NEGATIVE NEGATIVE   Bilirubin Urine NEGATIVE NEGATIVE   Ketones, ur NEGATIVE NEGATIVE mg/dL   Protein, ur 30 (A) NEGATIVE mg/dL   Nitrite NEGATIVE NEGATIVE   Leukocytes,Ua  NEGATIVE NEGATIVE   RBC / HPF 0-5 0 - 5 RBC/hpf   WBC, UA 0-5 0 - 5 WBC/hpf   Bacteria, UA FEW (A) NONE SEEN   Squamous Epithelial / LPF 6-10 0 - 5   Mucus PRESENT     Imaging:  No results found.  MAU Course: Orders Placed This Encounter  Procedures  . [place] MFM OB LIMITED  . Urinalysis, Routine w reflex microscopic   No orders of the defined types were placed in this encounter.   MDM: Reactive NST Limited ultrasound shows anterior placenta with no sign of abruption.  Patient continues to contract after 4 hours of monitoring. Cervix closed/thick. Will observe overnight per c/w Dr. Elonda Husky.   Assessment: 1. Traumatic injury during pregnancy in third trimester   2. [redacted] weeks gestation of pregnancy     Plan: Observe on OBSC unit IV LR bolus followed by 177ml/hr CEFM/TOCO  Jorje Guild, NP 04/23/2018 6:31 PM

## 2018-04-24 ENCOUNTER — Encounter (HOSPITAL_COMMUNITY): Payer: Self-pay

## 2018-04-24 ENCOUNTER — Encounter: Payer: Self-pay | Admitting: Family Medicine

## 2018-04-24 DIAGNOSIS — W19XXXA Unspecified fall, initial encounter: Secondary | ICD-10-CM | POA: Diagnosis not present

## 2018-04-24 DIAGNOSIS — O9A213 Injury, poisoning and certain other consequences of external causes complicating pregnancy, third trimester: Secondary | ICD-10-CM | POA: Diagnosis not present

## 2018-04-24 DIAGNOSIS — Z98891 History of uterine scar from previous surgery: Secondary | ICD-10-CM | POA: Insufficient documentation

## 2018-04-24 DIAGNOSIS — Z3A32 32 weeks gestation of pregnancy: Secondary | ICD-10-CM | POA: Diagnosis not present

## 2018-04-24 LAB — ABO/RH: ABO/RH(D): O POS

## 2018-04-24 MED ORDER — TETANUS-DIPHTH-ACELL PERTUSSIS 5-2.5-18.5 LF-MCG/0.5 IM SUSP: 0.5000 mL | Freq: Once | INTRAMUSCULAR | Status: DC

## 2018-04-24 NOTE — Progress Notes (Signed)
Patient discharged home. Discharge prescriptions reviewed; admission discussed; medications discussed; discharge instructions reviewed; follow-up care reviewed. Patient/caregiver verbalized understanding

## 2018-04-24 NOTE — Progress Notes (Signed)
Patient ID: Jennifer Booth, female   DOB: 05-27-1985, 33 y.o.   MRN: [id number] [place]) NOTE  [place] is a 33 y.o. [id number] with Estimated Date of Delivery: 06/18/18   By  LMP, sonogram [redacted]w[redacted]d  who is admitted for observation after a fall.    Fetal presentation is unsure. Length of Stay:  0  Days  Date of admission:04/23/2018  Subjective: Pt denies any problems Patient reports the fetal movement as active. Patient reports uterine contraction  activity as none. Patient reports  vaginal bleeding as none. Patient describes fluid per vagina as None.  Vitals:  Blood pressure (!) 106/51, pulse 94, temperature 97.9 F (36.6 C), temperature source Oral, resp. rate 16, height 5\' 5"  (1.651 m), weight 114.8 kg, SpO2 100 %. Vitals:   04/23/18 1946 04/23/18 2035 04/23/18 2128 04/23/18 2327  BP: 124/64 (!) 104/59  (!) 106/51  Pulse: 85 87  94  Resp: 16 16    Temp: 98.3 F (36.8 C) 98.4 F (36.9 C)  97.9 F (36.6 C)  TempSrc: Oral Oral  Oral  SpO2:  99%  100%  Weight:   114.8 kg   Height:   5\' 5"  (1.651 m)    Physical Examination:  General appearance - alert, well appearing, and in no distress Abdomen - soft, nontender, nondistended, no masses or organomegaly Fundal Height:  size equals dates Pelvic Exam:  examination not indicated Cervical Exam: Not evaluated.  Extremities: extremities normal, atraumatic, no cyanosis or edema with DTRs  Membranes:intact  Fetal Monitoring:  Baseline: 140s bpm, Variability: Good {> 6 bpm), Accelerations: Reactive and Decelerations: Absent   reactive  Labs:  Results for orders placed or performed during the hospital encounter of 04/23/18 (from the past 24 hour(s))  Urinalysis, Routine w reflex microscopic   Collection Time: 04/23/18  1:17 PM  Result Value Ref Range   Color, Urine YELLOW YELLOW   APPearance HAZY (A) CLEAR   Specific Gravity, Urine 1.025 1.005 - 1.030   pH 6.0 5.0 - 8.0   Glucose, UA NEGATIVE  NEGATIVE mg/dL   Hgb urine dipstick NEGATIVE NEGATIVE   Bilirubin Urine NEGATIVE NEGATIVE   Ketones, ur NEGATIVE NEGATIVE mg/dL   Protein, ur 30 (A) NEGATIVE mg/dL   Nitrite NEGATIVE NEGATIVE   Leukocytes,Ua NEGATIVE NEGATIVE   RBC / HPF 0-5 0 - 5 RBC/hpf   WBC, UA 0-5 0 - 5 WBC/hpf   Bacteria, UA FEW (A) NONE SEEN   Squamous Epithelial / LPF 6-10 0 - 5   Mucus PRESENT   Type and screen [place]   Collection Time: 04/23/18  9:20 PM  Result Value Ref Range   ABO/RH(D) O POS    Antibody Screen NEG    Sample Expiration      04/26/2018 Performed at [facility], [address], [place], [place] [address]   ABO/Rh   Collection Time: 04/23/18  9:20 PM  Result Value Ref Range   ABO/RH(D)      O POS Performed at [place] [address] [place], [place] [address]     Imaging Studies:    Report pending but no abruption on prelim report  Medications:  Scheduled . docusate sodium  100 mg Oral Daily  . prenatal multivitamin  1 tablet Oral Q1200  . Tdap  0.5 mL Intramuscular Once   I have reviewed the patient's current medications.  ASSESSMENT: [id number] [redacted]w[redacted]d Estimated Date of Delivery: 06/18/18 Fall in pregnancy 3rd trimester  Patient Active Problem List   Diagnosis Date Noted  . Traumatic injury during pregnancy in third trimester 04/23/2018  . Late prenatal care affecting pregnancy in third trimester 04/19/2018  . History of preterm delivery 04/19/2018  . History of twin pregnancy in prior pregnancy 04/19/2018  . Supervision of other normal pregnancy, antepartum 04/18/2018    PLAN: >24 hour observation up today after noon so if remains stable with no further significant uterine activity will be able to be discharged  [place] 04/24/2018,7:49 AM

## 2018-04-24 NOTE — H&P (Signed)
Attestation signed by Florian Buff, MD at 04/24/2018 7:48 AM  Attestation of Attending Supervision of Advanced Practitioner (CNM/NP/PA): Evaluation and management procedures were performed by the Advanced Practitioner under my supervision and collaboration. I have reviewed the Advanced Practitioner's note and chart, and I agree with the management and plan.  Jacelyn Grip MD Attending Physician for the Center for [facility] 04/24/2018 7:48 AM     Expand All Collapse All    Show:Clear all [x] Manual[x] Template[] Copied  Added by: [x] Jorje Guild, NP  [] Hover for details Chief Complaint:  Abdominal Pain and Fall   First Provider Initiated Contact with Patient 04/23/18 1406     HPI: Jennifer Booth is a 33 y.o. [id number] at [redacted]w[redacted]d who presents to maternity admissions reporting abdominal & back pain after a fall. [place] this morning when she missed a step. Landed on her hands & abdomen. Reports intermittent low abdomen & back pain since then. Denies LOF or vaginal bleeding. Normal fetal movement.  Location: abdomen & back Quality: contractions, sharp, pressure Severity: 7/10 in pain scale Duration: after fall Timing: intermittent Modifying factors: none Associated signs and symptoms: none      Past Medical History:  Diagnosis Date  . Pre-eclampsia                    OB History  Gravida Para Term Preterm AB Living  4 3 2 1  0 4  SAB TAB Ectopic Multiple Live Births     0 0 0 1 4       # Outcome Date GA Lbr Len/2nd Weight Sex Delivery Anes PTL Lv  4 Current           3A Preterm 08/06/16 [redacted]w[redacted]d    CS-LTranv   LIV     Complications: Preeclampsia  3B Preterm 08/06/16 [redacted]w[redacted]d    CS-LTranv     2 Term 2016 [redacted]w[redacted]d    Vag-Spont   LIV  1 Term 2007 [redacted]w[redacted]d    Vag-Spont   LIV        Past Surgical History:  Procedure Laterality Date  . CESAREAN SECTION          Family History  Problem Relation Age of Onset  . Hypertension Mother   .  Diabetes Mother   . Hypercholesterolemia Father   . Hypertension Father   . Heart disease Maternal Grandmother   . Diabetes Maternal Grandmother    Social History       Tobacco Use  . Smoking status: Former Research scientist (life sciences)  . Smokeless tobacco: Never Used  Substance Use Topics  . Alcohol use: Not Currently  . Drug use: Never       Allergies  Allergen Reactions  . Zofran [Ondansetron Hcl] Anaphylaxis  . Ciprofloxacin           Medications Prior to Admission  Medication Sig Dispense Refill Last Dose  . Elastic Bandages & Supports (COMFORT FIT MATERNITY SUPP LG) MISC 1 Device by Does not apply route as needed. 1 each 0 Taking  . ferrous sulfate 325 (65 FE) MG tablet Take 1 tablet (325 mg total) by mouth daily. 30 tablet 0 Taking  . Prenatal Vit-Fe Fumarate-FA (PRENATAL MULTIVITAMIN) TABS tablet Take 1 tablet by mouth daily at 12 noon.   Taking  . tinidazole (TINDAMAX) 500 MG tablet Take 2 tablets (1,000 mg total) by mouth daily with breakfast. 10 tablet 2     I have reviewed patient's Past Medical Hx, Surgical Hx, Family Hx, Social Hx, medications and  allergies.   ROS:  Review of Systems  Constitutional: Negative.   Gastrointestinal: Positive for abdominal pain.  Genitourinary: Negative.   Musculoskeletal: Positive for back pain.    Physical Exam   Patient Vitals for the past 24 hrs:  BP Temp Temp src Pulse Resp SpO2 Weight  04/23/18 1311 111/75 98.3 F (36.8 C) Oral 93 18 100 % 115.1 kg    Constitutional: Well-developed, well-nourished female in no acute distress.  Cardiovascular: normal rate & rhythm, no murmur Respiratory: normal effort, lung sounds clear throughout GI: Abd soft, non-tender, gravid appropriate for gestational age. No bruising. Ctx palpate mild.  MS: Extremities nontender, no edema, normal ROM Neurologic: Alert and oriented x 4.  GU:      Dilation: Closed Effacement (%): Thick Cervical Position: Anterior Exam by:: Jorje Guild NP    NST:  Baseline: 145 bpm, Variability: Good {> 6 bpm), Accelerations: Reactive and Decelerations: Absent   Labs: LabResultsLast24Hours       Results for orders placed or performed during the hospital encounter of 04/23/18 (from the past 24 hour(s))  Urinalysis, Routine w reflex microscopic     Status: Abnormal   Collection Time: 04/23/18  1:17 PM  Result Value Ref Range   Color, Urine YELLOW YELLOW   APPearance HAZY (A) CLEAR   Specific Gravity, Urine 1.025 1.005 - 1.030   pH 6.0 5.0 - 8.0   Glucose, UA NEGATIVE NEGATIVE mg/dL   Hgb urine dipstick NEGATIVE NEGATIVE   Bilirubin Urine NEGATIVE NEGATIVE   Ketones, ur NEGATIVE NEGATIVE mg/dL   Protein, ur 30 (A) NEGATIVE mg/dL   Nitrite NEGATIVE NEGATIVE   Leukocytes,Ua NEGATIVE NEGATIVE   RBC / HPF 0-5 0 - 5 RBC/hpf   WBC, UA 0-5 0 - 5 WBC/hpf   Bacteria, UA FEW (A) NONE SEEN   Squamous Epithelial / LPF 6-10 0 - 5   Mucus PRESENT       Imaging:  No results found.  MAU Course:    Orders Placed This Encounter  Procedures  . [place] MFM OB LIMITED  . Urinalysis, Routine w reflex microscopic   No orders of the defined types were placed in this encounter.   MDM: Reactive NST Limited ultrasound shows anterior placenta with no sign of abruption.  Patient continues to contract after 4 hours of monitoring. Cervix closed/thick. Will observe overnight per c/w Dr. Elonda Husky.   Assessment: 1. Traumatic injury during pregnancy in third trimester   2. [redacted] weeks gestation of pregnancy     Plan: Observe on OBSC unit IV LR bolus followed by 156ml/hr CEFM/TOCO  Jorje Guild, NP 04/23/2018 6:31 PM

## 2018-04-24 NOTE — Discharge Summary (Signed)
Antenatal Physician Discharge Summary  Patient ID: Jennifer Booth MRN: [id number] DOB/AGE: October 20, 1985 33 y.o.  Admit date: 04/23/2018 Discharge date: 04/24/2018  Admission Diagnoses: abdominal trauma in pregnancy  Discharge Diagnoses:  Active Problems:   Supervision of other normal pregnancy, antepartum   Traumatic injury during pregnancy in third trimester   Prenatal Procedures: NST and ultrasound  Consults: non  Hospital Course:  This is a 33 y.o. [id number] with IUP at [redacted]w[redacted]d admitted for abdominal trauma after a fall. She was admitted with contractions, noted to have a cervical exam of closed/thick/anterior.  No leaking of fluid and no bleeding.  She was monitored overnight and contractions ceased last evening, none today. She also complains of back pain yesterday which has improved as well. She was observed, fetal heart rate monitoring remained reassuring, and she had no signs/symptoms of progressing preterm labor or other maternal-fetal concerns. She was deemed stable for discharge to home with outpatient follow up.  Discharge Exam: Temp:  [97.9 F (36.6 C)-98.4 F (36.9 C)] 98.1 F (36.7 C) (03/16 1150) Pulse Rate:  [82-94] 90 (03/16 1150) Resp:  [16] 16 (03/16 1150) BP: (104-124)/(51-65) 107/58 (03/16 1150) SpO2:  [97 %-100 %] 100 % (03/16 1150) Weight:  [114.8 kg] 114.8 kg (03/15 2128) Physical Examination: CONSTITUTIONAL: Well-developed, well-nourished female in no acute distress.  HENT:  Normocephalic, atraumatic, External right and left ear normal. Oropharynx is clear and moist EYES: Conjunctivae and EOM are normal. Pupils are equal, round, and reactive to light. No scleral icterus.  NECK: Normal range of motion, supple, no masses SKIN: Skin is warm and dry. No rash noted. Not diaphoretic. No erythema. No pallor. [place]: Alert and oriented to person, place, and time. Normal reflexes, muscle tone coordination. No cranial nerve deficit noted. PSYCHIATRIC: Normal mood  and affect. Normal behavior. Normal judgment and thought content. CARDIOVASCULAR: Normal heart rate noted, regular rhythm RESPIRATORY: Effort and breath sounds normal, no problems with respiration noted MUSCULOSKELETAL: Normal range of motion. No edema and no tenderness. 2+ distal pulses. ABDOMEN: Soft, nontender, nondistended, gravid. CERVIX: Dilation: Closed Effacement (%): Thick Cervical Position: Anterior Exam by:: Jorje Guild NP   Fetal monitoring: FHR: 125 bpm, Variability: moderate, Accelerations: Present, Decelerations: Absent  Uterine activity: no contractions per hour  Significant Diagnostic Studies:  Results for orders placed or performed during the hospital encounter of 04/23/18 (from the past 168 hour(s))  Urinalysis, Routine w reflex microscopic   Collection Time: 04/23/18  1:17 PM  Result Value Ref Range   Color, Urine YELLOW YELLOW   APPearance HAZY (A) CLEAR   Specific Gravity, Urine 1.025 1.005 - 1.030   pH 6.0 5.0 - 8.0   Glucose, UA NEGATIVE NEGATIVE mg/dL   Hgb urine dipstick NEGATIVE NEGATIVE   Bilirubin Urine NEGATIVE NEGATIVE   Ketones, ur NEGATIVE NEGATIVE mg/dL   Protein, ur 30 (A) NEGATIVE mg/dL   Nitrite NEGATIVE NEGATIVE   Leukocytes,Ua NEGATIVE NEGATIVE   RBC / HPF 0-5 0 - 5 RBC/hpf   WBC, UA 0-5 0 - 5 WBC/hpf   Bacteria, UA FEW (A) NONE SEEN   Squamous Epithelial / LPF 6-10 0 - 5   Mucus PRESENT   Type and screen [place]   Collection Time: 04/23/18  9:20 PM  Result Value Ref Range   ABO/RH(D) O POS    Antibody Screen NEG    Sample Expiration      04/26/2018 Performed at [facility], [address] [place], [place] [address]   ABO/Rh  Collection Time: 04/23/18  9:20 PM  Result Value Ref Range   ABO/RH(D) O POS    No rh immune globuloin      NOT A RH IMMUNE GLOBULIN CANDIDATE, PT RH POSITIVE Performed at [facility] [address] [place], [place] [address]   Results for orders placed or performed  in visit on 04/19/18 (from the past 168 hour(s))  Cervicovaginal ancillary only( [place])   Collection Time: 04/19/18 12:00 AM  Result Value Ref Range   Bacterial vaginitis **POSITIVE for Gardnerella vaginalis** (A)    Candida vaginitis Negative for Candida species    Chlamydia Negative    Neisseria gonorrhea Negative    Trichomonas Negative   Cytology - PAP( [place])   Collection Time: 04/19/18 12:00 AM  Result Value Ref Range   Adequacy (A)     Satisfactory for evaluation  endocervical/transformation zone component ABSENT.   Diagnosis (A)     ATYPICAL SQUAMOUS CELLS OF UNDETERMINED SIGNIFICANCE (ASC-US).   HPV 16/18/45 genotyping NEGATIVE for HPV 16 & 18/45    HPV DETECTED (A)    Material Submitted CervicoVaginal Pap [ThinPrep Imaged] (A)   Obstetric Panel, Including HIV   Collection Time: 04/19/18  9:46 AM  Result Value Ref Range   Hepatitis B Surface Ag Negative Negative   RPR Ser Ql Non Reactive Non Reactive   Rubella Antibodies, IGG 2.02 Immune >0.99 index   ABO Grouping O    Rh Factor Positive    Antibody Screen Negative Negative   HIV Screen 4th Generation wRfx Non Reactive Non Reactive   WBC 6.5 3.4 - 10.8 x10E3/uL   RBC 2.99 (L) 3.77 - 5.28 x10E6/uL   Hemoglobin 9.8 (L) 11.1 - 15.9 g/dL   Hematocrit 27.9 (L) 34.0 - 46.6 %   MCV 93 79 - 97 fL   MCH 32.8 26.6 - 33.0 pg   MCHC 35.1 31.5 - 35.7 g/dL   RDW 13.8 11.7 - 15.4 %   Platelets 227 150 - 450 x10E3/uL   Neutrophils 58 Not Estab. %   Lymphs 31 Not Estab. %   Monocytes 9 Not Estab. %   Eos 1 Not Estab. %   Basos 0 Not Estab. %   Neutrophils Absolute 3.7 1.4 - 7.0 x10E3/uL   Lymphocytes Absolute 2.0 0.7 - 3.1 x10E3/uL   Monocytes Absolute 0.6 0.1 - 0.9 x10E3/uL   EOS (ABSOLUTE) 0.1 0.0 - 0.4 x10E3/uL   Basophils Absolute 0.0 0.0 - 0.2 x10E3/uL   Immature Granulocytes 1 Not Estab. %   Immature Grans (Abs) 0.0 0.0 - 0.1 x10E3/uL  Hemoglobin A1c   Collection Time: 04/19/18  9:46 AM  Result Value Ref  Range   Hgb A1c MFr Bld 5.3 4.8 - 5.6 %   Est. average glucose Bld gHb Est-mCnc 105 mg/dL    Discharge Condition: Stable  Disposition:  There are no questions and answers to display.         Discharge Instructions    Notify physician for a general feeling that "something is not right"   Complete by:  As directed    Notify physician for increase or change in vaginal discharge   Complete by:  As directed    Notify physician for intestinal cramps, with or without diarrhea, sometimes described as "gas pain"   Complete by:  As directed    Notify physician for leaking of fluid   Complete by:  As directed    Notify physician for low, dull backache, unrelieved by  heat or Tylenol   Complete by:  As directed    Notify physician for menstrual like cramps   Complete by:  As directed    Notify physician for pelvic pressure   Complete by:  As directed    Notify physician for uterine contractions.  These may be painless and feel like the uterus is tightening or the baby is  "balling up"   Complete by:  As directed    Notify physician for vaginal bleeding   Complete by:  As directed    PRETERM LABOR:  Includes any of the follwing symptoms that occur between 20 - [redacted] weeks gestation.  If these symptoms are not stopped, preterm labor can result in preterm delivery, placing your baby at risk   Complete by:  As directed      Allergies as of 04/24/2018      Reactions   Ciprofloxacin Anaphylaxis   Zofran [ondansetron Hcl] Anaphylaxis      Medication List    TAKE these medications   calcium carbonate 500 MG chewable tablet Commonly known as:  TUMS - dosed in mg elemental calcium Chew 1 tablet by mouth 4 (four) times daily as needed for indigestion or heartburn.   ferrous sulfate 325 (65 FE) MG tablet Take 1 tablet (325 mg total) by mouth daily.   prenatal multivitamin Tabs tablet Take 1 tablet by mouth daily at 12 noon.   tinidazole 500 MG tablet Commonly known as:  Tindamax Take 2  tablets (1,000 mg total) by mouth daily with breakfast. What changed:    how much to take  when to take this      Follow-up [place]. Go to.   Specialty:  Obstetrics and Gynecology Why:  routine appointment Contact information: [address] [place] [phone]          Signed: Feliz Beam, M.D. [facility] [employer] (Faculty Practice)  04/24/2018, 2:56 PM

## 2018-04-25 ENCOUNTER — Other Ambulatory Visit: Payer: Self-pay | Admitting: Obstetrics

## 2018-04-25 ENCOUNTER — Encounter: Payer: Self-pay | Admitting: Obstetrics

## 2018-04-25 DIAGNOSIS — O2340 Unspecified infection of urinary tract in pregnancy, unspecified trimester: Secondary | ICD-10-CM

## 2018-04-25 LAB — URINE CULTURE, OB REFLEX

## 2018-04-25 LAB — CULTURE, OB URINE

## 2018-04-25 MED ORDER — AMOXICILLIN-POT CLAVULANATE 875-125 MG PO TABS: 1.0000 | ORAL_TABLET | Freq: Two times a day (BID) | ORAL | 0 refills | Status: DC

## 2018-05-02 ENCOUNTER — Ambulatory Visit (HOSPITAL_COMMUNITY)
Admission: RE | Admit: 2018-05-02 | Discharge: 2018-05-02 | Disposition: A | Discharge status: Home / Self Care | Payer: Medicaid Other | Source: Ambulatory Visit | Attending: Obstetrics and Gynecology | Admitting: Obstetrics and Gynecology

## 2018-05-02 ENCOUNTER — Encounter (HOSPITAL_COMMUNITY): Payer: Self-pay

## 2018-05-02 ENCOUNTER — Ambulatory Visit (HOSPITAL_COMMUNITY): Payer: Medicaid Other | Admitting: *Deleted

## 2018-05-02 ENCOUNTER — Other Ambulatory Visit: Payer: Self-pay

## 2018-05-02 ENCOUNTER — Other Ambulatory Visit (HOSPITAL_COMMUNITY): Payer: Self-pay | Admitting: *Deleted

## 2018-05-02 VITALS — BP 109/70 | HR 98 | Temp 98.9°F

## 2018-05-02 DIAGNOSIS — O09213 Supervision of pregnancy with history of pre-term labor, third trimester: Secondary | ICD-10-CM | POA: Diagnosis not present

## 2018-05-02 DIAGNOSIS — O0990 Supervision of high risk pregnancy, unspecified, unspecified trimester: Secondary | ICD-10-CM | POA: Insufficient documentation

## 2018-05-02 DIAGNOSIS — O0933 Supervision of pregnancy with insufficient antenatal care, third trimester: Secondary | ICD-10-CM | POA: Diagnosis not present

## 2018-05-02 DIAGNOSIS — Z3480 Encounter for supervision of other normal pregnancy, unspecified trimester: Secondary | ICD-10-CM | POA: Insufficient documentation

## 2018-05-02 DIAGNOSIS — O34219 Maternal care for unspecified type scar from previous cesarean delivery: Secondary | ICD-10-CM

## 2018-05-02 DIAGNOSIS — Z363 Encounter for antenatal screening for malformations: Secondary | ICD-10-CM

## 2018-05-02 DIAGNOSIS — Z3A33 33 weeks gestation of pregnancy: Secondary | ICD-10-CM

## 2018-05-02 DIAGNOSIS — O4190X0 Disorder of amniotic fluid and membranes, unspecified, unspecified trimester, not applicable or unspecified: Secondary | ICD-10-CM

## 2018-05-03 ENCOUNTER — Encounter: Payer: Self-pay | Admitting: Obstetrics and Gynecology

## 2018-05-03 ENCOUNTER — Encounter: Payer: Medicaid Other | Admitting: Obstetrics

## 2018-05-03 ENCOUNTER — Ambulatory Visit (INDEPENDENT_AMBULATORY_CARE_PROVIDER_SITE_OTHER): Payer: Medicaid Other | Admitting: Obstetrics and Gynecology

## 2018-05-03 VITALS — BP 139/87 | HR 93 | Wt 253.9 lb

## 2018-05-03 DIAGNOSIS — Z8751 Personal history of pre-term labor: Secondary | ICD-10-CM

## 2018-05-03 DIAGNOSIS — O34211 Maternal care for low transverse scar from previous cesarean delivery: Secondary | ICD-10-CM

## 2018-05-03 DIAGNOSIS — Z23 Encounter for immunization: Secondary | ICD-10-CM | POA: Diagnosis not present

## 2018-05-03 DIAGNOSIS — O09293 Supervision of pregnancy with other poor reproductive or obstetric history, third trimester: Secondary | ICD-10-CM | POA: Diagnosis not present

## 2018-05-03 DIAGNOSIS — O09299 Supervision of pregnancy with other poor reproductive or obstetric history, unspecified trimester: Secondary | ICD-10-CM | POA: Insufficient documentation

## 2018-05-03 DIAGNOSIS — Z3480 Encounter for supervision of other normal pregnancy, unspecified trimester: Secondary | ICD-10-CM

## 2018-05-03 DIAGNOSIS — Z98891 History of uterine scar from previous surgery: Secondary | ICD-10-CM

## 2018-05-03 DIAGNOSIS — O09213 Supervision of pregnancy with history of pre-term labor, third trimester: Secondary | ICD-10-CM | POA: Diagnosis not present

## 2018-05-03 DIAGNOSIS — Z3A33 33 weeks gestation of pregnancy: Secondary | ICD-10-CM | POA: Diagnosis not present

## 2018-05-03 DIAGNOSIS — Z3009 Encounter for other general counseling and advice on contraception: Secondary | ICD-10-CM | POA: Insufficient documentation

## 2018-05-03 NOTE — Progress Notes (Signed)
   PRENATAL VISIT NOTE  Subjective:  Jennifer Booth is a 33 y.o. [phone] at [redacted]w[redacted]d being seen today for ongoing prenatal care.  She is currently monitored for the following issues for this high-risk pregnancy and has Supervision of other normal pregnancy, antepartum; Late prenatal care affecting pregnancy in third trimester; History of preterm delivery; History of twin pregnancy in prior pregnancy; Traumatic injury during pregnancy in third trimester; History of C-section; H/O pre-eclampsia in prior pregnancy, currently pregnant; and Unwanted fertility on their problem list.  Patient reports no complaints.  Contractions: Not present. Vag. Bleeding: None.  Movement: Present. Denies leaking of fluid.   The following portions of the patient's history were reviewed and updated as appropriate: allergies, current medications, past family history, past medical history, past social history, past surgical history and problem list.   Objective:   Vitals:   05/03/18 0948 05/03/18 0951  BP: (!) 132/91 139/87  Pulse: 98 93  Weight: 253 lb 14.4 oz (115.2 kg)    Fetal Status: Fetal Heart Rate (bpm): 140   Movement: Present     General:  Alert, oriented and cooperative. Patient is in no acute distress.  Skin: Skin is warm and dry. No rash noted.   Cardiovascular: Normal heart rate noted  Respiratory: Normal respiratory effort, no problems with respiration noted  Abdomen: Soft, gravid, appropriate for gestational age.  Pain/Pressure: Absent     Pelvic: Cervical exam deferred        Extremities: Normal range of motion.  Edema: None  Mental Status: Normal mood and affect. Normal behavior. Normal judgment and thought content.   Assessment and Plan:  Pregnancy: [id number] at [redacted]w[redacted]d 1. Supervision of other normal pregnancy, antepartum - Tdap vaccine greater than or equal to 7yo IM  2. History of C-section Reviewed risks/benefits of TOLAC versus RCS in detail. Patient counseled regarding potential vaginal  delivery, chance of success, future implications, possible uterine rupture and need for urgent/emergent repeat cesarean. Counseled regarding potential need for repeat c-section for reasons unrelated to first c-section. Counseled regarding scheduled repeat cesarean including risks of bleeding, infection, damage to surrounding tissue, abnormal placentation, implications for future pregnancies. All questions answered.  Patient desires RCS, consent signed 05/03/2018. - message sent to scheduler for RCS+BTL  3. History of preterm delivery  4. H/O pre-eclampsia in prior pregnancy, currently pregnant - Severe PEC in 2 prior pregnancies - BP slightly elevated today - Protein / creatinine ratio, urine - reviewed reasons to present to MAU, pt verbalizes understanding  5. Unwanted fertility BTL papers signed today   Preterm labor symptoms and general obstetric precautions including but not limited to vaginal bleeding, contractions, leaking of fluid and fetal movement were reviewed in detail with the patient. Please refer to After Visit Summary for other counseling recommendations.   Return in about 2 weeks (around 05/17/2018) for OB visit (MD).  Future Appointments  Date Time Provider [place]  05/09/2018  9:15 AM [place] 4 WH-MFCUS MFC-US  05/09/2018  9:35 AM WH-MFC NURSE WH-MFC MFC-US    Sloan Leiter, MD

## 2018-05-04 ENCOUNTER — Telehealth: Payer: Self-pay

## 2018-05-04 LAB — GLUCOSE TOLERANCE, 2 HOURS W/ 1HR
Glucose, 1 hour: 124 mg/dL (ref 65–179)
Glucose, 2 hour: 104 mg/dL (ref 65–152)
Glucose, Fasting: 81 mg/dL (ref 65–91)

## 2018-05-04 LAB — PROTEIN / CREATININE RATIO, URINE
CREATININE, UR: 207.1 mg/dL
PROTEIN UR: 50.4 mg/dL
Protein/Creat Ratio: 243 mg/g creat — ABNORMAL HIGH (ref 0–200)

## 2018-05-04 NOTE — Telephone Encounter (Signed)
TC from patient regarding Tdap vaccine  Pt c/o feeling tired, low fever was normal after checking it at home  Pt advised of CDC guidelines for possible side effects from vaccine Pt advised if sx's become worse to report to MAU for an evaluation.  Pt voiced understanding.

## 2018-05-09 ENCOUNTER — Other Ambulatory Visit (HOSPITAL_COMMUNITY): Payer: Self-pay | Admitting: *Deleted

## 2018-05-09 ENCOUNTER — Ambulatory Visit (HOSPITAL_COMMUNITY)
Admission: RE | Admit: 2018-05-09 | Discharge: 2018-05-09 | Disposition: A | Discharge status: Home / Self Care | Payer: Medicaid Other | Source: Ambulatory Visit | Attending: Obstetrics and Gynecology | Admitting: Obstetrics and Gynecology

## 2018-05-09 ENCOUNTER — Encounter (HOSPITAL_COMMUNITY): Payer: Self-pay

## 2018-05-09 ENCOUNTER — Ambulatory Visit (HOSPITAL_COMMUNITY): Payer: Medicaid Other | Admitting: *Deleted

## 2018-05-09 ENCOUNTER — Other Ambulatory Visit: Payer: Self-pay

## 2018-05-09 VITALS — BP 98/60 | HR 105 | Temp 98.6°F

## 2018-05-09 DIAGNOSIS — O0933 Supervision of pregnancy with insufficient antenatal care, third trimester: Secondary | ICD-10-CM

## 2018-05-09 DIAGNOSIS — O0990 Supervision of high risk pregnancy, unspecified, unspecified trimester: Secondary | ICD-10-CM | POA: Insufficient documentation

## 2018-05-09 DIAGNOSIS — O4190X0 Disorder of amniotic fluid and membranes, unspecified, unspecified trimester, not applicable or unspecified: Secondary | ICD-10-CM | POA: Insufficient documentation

## 2018-05-09 DIAGNOSIS — O34219 Maternal care for unspecified type scar from previous cesarean delivery: Secondary | ICD-10-CM

## 2018-05-09 DIAGNOSIS — O09219 Supervision of pregnancy with history of pre-term labor, unspecified trimester: Secondary | ICD-10-CM

## 2018-05-09 DIAGNOSIS — O4100X0 Oligohydramnios, unspecified trimester, not applicable or unspecified: Secondary | ICD-10-CM

## 2018-05-09 DIAGNOSIS — Z3A34 34 weeks gestation of pregnancy: Secondary | ICD-10-CM

## 2018-05-15 ENCOUNTER — Encounter (HOSPITAL_COMMUNITY): Payer: Self-pay

## 2018-05-15 ENCOUNTER — Inpatient Hospital Stay (HOSPITAL_COMMUNITY)
Admission: AD | Admit: 2018-05-15 | Discharge: 2018-05-15 | Disposition: A | Discharge status: Home / Self Care | Payer: Medicaid Other | Attending: Obstetrics & Gynecology | Admitting: Obstetrics & Gynecology

## 2018-05-15 ENCOUNTER — Other Ambulatory Visit: Payer: Self-pay

## 2018-05-15 DIAGNOSIS — Z3A35 35 weeks gestation of pregnancy: Secondary | ICD-10-CM | POA: Diagnosis not present

## 2018-05-15 DIAGNOSIS — O34219 Maternal care for unspecified type scar from previous cesarean delivery: Secondary | ICD-10-CM | POA: Diagnosis not present

## 2018-05-15 DIAGNOSIS — Z881 Allergy status to other antibiotic agents status: Secondary | ICD-10-CM | POA: Insufficient documentation

## 2018-05-15 DIAGNOSIS — Z888 Allergy status to other drugs, medicaments and biological substances status: Secondary | ICD-10-CM | POA: Diagnosis not present

## 2018-05-15 DIAGNOSIS — O4703 False labor before 37 completed weeks of gestation, third trimester: Secondary | ICD-10-CM | POA: Diagnosis not present

## 2018-05-15 DIAGNOSIS — Z87891 Personal history of nicotine dependence: Secondary | ICD-10-CM | POA: Insufficient documentation

## 2018-05-15 DIAGNOSIS — Z833 Family history of diabetes mellitus: Secondary | ICD-10-CM | POA: Insufficient documentation

## 2018-05-15 DIAGNOSIS — Z8759 Personal history of other complications of pregnancy, childbirth and the puerperium: Secondary | ICD-10-CM | POA: Insufficient documentation

## 2018-05-15 LAB — URINALYSIS, ROUTINE W REFLEX MICROSCOPIC
Bilirubin Urine: NEGATIVE
Glucose, UA: NEGATIVE mg/dL
Hgb urine dipstick: NEGATIVE
Ketones, ur: 5 mg/dL — AB
Nitrite: NEGATIVE
Protein, ur: NEGATIVE mg/dL
Specific Gravity, Urine: 1.006 (ref 1.005–1.030)
pH: 6 (ref 5.0–8.0)

## 2018-05-15 NOTE — MAU Provider Note (Signed)
Chief Complaint:  Contractions   None    HPI: Jennifer Booth is a 33 y.o. [id number] at [redacted]w[redacted]d who presents to maternity admissions reporting contractions. Symptoms started this morning. Reports feeling painful contractions about every 5 minutes. Denies LOF or vaginal bleeding. Normal fetal movement.   Location: abdomen Quality: contractions Severity: 8/10 in pain scale Duration: 1 day Timing: every 5 minutes Modifying factors: none Associated signs and symptoms: none  Pregnancy Course: late to care, hx of preeclampsia, plans repeat c/section  Past Medical History:  Diagnosis Date  . Pre-eclampsia 2018   OB History  Gravida Para Term Preterm AB Living  4 3 2 1  0 4  SAB TAB Ectopic Multiple Live Births  0 0 0 1 4    # Outcome Date GA Lbr Len/2nd Weight Sex Delivery Anes PTL Lv  4 Current           3A Preterm 08/06/16 [redacted]w[redacted]d    CS-LTranv   LIV     Complications: Preeclampsia  3B Preterm 08/06/16 [redacted]w[redacted]d    CS-LTranv     2 Term 2016 [redacted]w[redacted]d    Vag-Spont   LIV  1 Term 2007 [redacted]w[redacted]d    Vag-Spont   LIV   Past Surgical History:  Procedure Laterality Date  . CESAREAN SECTION     Family History  Problem Relation Age of Onset  . Hypertension Mother   . Diabetes Mother   . Hypercholesterolemia Father   . Hypertension Father   . Heart disease Maternal Grandmother   . Diabetes Maternal Grandmother    Social History   Tobacco Use  . Smoking status: Former Research scientist (life sciences)  . Smokeless tobacco: Never Used  Substance Use Topics  . Alcohol use: Not Currently  . Drug use: Never   Allergies  Allergen Reactions  . Ciprofloxacin Anaphylaxis  . Zofran [Ondansetron Hcl] Anaphylaxis   Medications Prior to Admission  Medication Sig Dispense Refill Last Dose  . calcium carbonate (TUMS - DOSED IN MG ELEMENTAL CALCIUM) 500 MG chewable tablet Chew 1 tablet by mouth 4 (four) times daily as needed for indigestion or heartburn.   05/15/2018 at Unknown time  . ferrous sulfate 325 (65 FE) MG tablet Take 1  tablet (325 mg total) by mouth daily. 30 tablet 0 05/15/2018 at Unknown time  . Prenatal Vit-Fe Fumarate-FA (PRENATAL MULTIVITAMIN) TABS tablet Take 1 tablet by mouth daily at 12 noon.   05/15/2018 at Unknown time  . amoxicillin-clavulanate (AUGMENTIN) 875-125 MG tablet Take 1 tablet by mouth 2 (two) times daily. (Patient not taking: Reported on 05/03/2018) 14 tablet 0 Not Taking  . tinidazole (TINDAMAX) 500 MG tablet Take 2 tablets (1,000 mg total) by mouth daily with breakfast. (Patient not taking: Reported on 05/03/2018) 10 tablet 2 Not Taking    I have reviewed patient's Past Medical Hx, Surgical Hx, Family Hx, Social Hx, medications and allergies.   ROS:  Review of Systems  Constitutional: Negative.   Gastrointestinal: Positive for abdominal pain. Negative for constipation, diarrhea, nausea and vomiting.  Genitourinary: Negative.     Physical Exam   Patient Vitals for the past 24 hrs:  BP Temp Temp src Pulse Resp SpO2 Weight  05/15/18 1945 107/68 - - 93 - 99 % -  05/15/18 1655 108/74 99 F (37.2 C) Oral 93 18 99 % 114.4 kg    Constitutional: Well-developed, well-nourished female in no acute distress.  Cardiovascular: normal rate & rhythm, no murmur Respiratory: normal effort, lung sounds clear throughout GI: Abd soft, non-tender, gravid appropriate  for gestational age. Pos BS x 4 MS: Extremities nontender, no edema, normal ROM Neurologic: Alert and oriented x 4.  GU:   Dilation: 1 Effacement (%): Thick Cervical Position: Posterior Exam by:: Robyne Askew NP  NST:  Baseline: 135 bpm, Variability: Good {> 6 bpm), Accelerations: Reactive and Decelerations: Absent   Labs: Results for orders placed or performed during the hospital encounter of 05/15/18 (from the past 24 hour(s))  Urinalysis, Routine w reflex microscopic     Status: Abnormal   Collection Time: 05/15/18  5:00 PM  Result Value Ref Range   Color, Urine YELLOW YELLOW   APPearance CLEAR CLEAR   Specific Gravity, Urine  1.006 1.005 - 1.030   pH 6.0 5.0 - 8.0   Glucose, UA NEGATIVE NEGATIVE mg/dL   Hgb urine dipstick NEGATIVE NEGATIVE   Bilirubin Urine NEGATIVE NEGATIVE   Ketones, ur 5 (A) NEGATIVE mg/dL   Protein, ur NEGATIVE NEGATIVE mg/dL   Nitrite NEGATIVE NEGATIVE   Leukocytes,Ua SMALL (A) NEGATIVE   RBC / HPF 0-5 0 - 5 RBC/hpf   WBC, UA 6-10 0 - 5 WBC/hpf   Bacteria, UA FEW (A) NONE SEEN   Squamous Epithelial / LPF 0-5 0 - 5    Imaging:  No results found.  MAU Course: Orders Placed This Encounter  Procedures  . Culture, OB Urine  . Urinalysis, Routine w reflex microscopic  . Discharge patient   No orders of the defined types were placed in this encounter.   MDM: Reactive NST Pt with ?contractions per toco, abdomen soft & non tender. Ctx not palpated when showing up on monitor. Patient appears comfortable in bed.  Cervix 1/thick/posterior. Unchanged after 2 hours of monitoring.  Will discharge home. Discussed return precautions.   Assessment: 1. Preterm uterine contractions in third trimester, antepartum   2. [redacted] weeks gestation of pregnancy     Plan: Discharge home in stable condition.  Preterm Labor precautions and fetal kick counts   Allergies as of 05/15/2018      Reactions   Ciprofloxacin Anaphylaxis   Zofran [ondansetron Hcl] Anaphylaxis      Medication List    STOP taking these medications   amoxicillin-clavulanate 875-125 MG tablet Commonly known as:  Augmentin   tinidazole 500 MG tablet Commonly known as:  Tindamax     TAKE these medications   calcium carbonate 500 MG chewable tablet Commonly known as:  TUMS - dosed in mg elemental calcium Chew 1 tablet by mouth 4 (four) times daily as needed for indigestion or heartburn.   ferrous sulfate 325 (65 FE) MG tablet Take 1 tablet (325 mg total) by mouth daily.   prenatal multivitamin Tabs tablet Take 1 tablet by mouth daily at 12 noon.       Jorje Guild, NP 05/15/2018 7:48 PM

## 2018-05-15 NOTE — Discharge Instructions (Signed)
Signs and Symptoms of Labor  Labor is your body's natural process of moving your baby, placenta, and umbilical cord out of your uterus. The process of labor usually starts when your baby is full-term, between 37 and 40 weeks of pregnancy.  How will I know when I am close to going into labor?  As your body prepares for labor and the birth of your baby, you may notice the following symptoms in the weeks and days before true labor starts:   Having a strong desire to get your home ready to receive your new baby. This is called nesting. Nesting may be a sign that labor is approaching, and it may occur several weeks before birth. Nesting may involve cleaning and organizing your home.   Passing a small amount of thick, bloody mucus out of your vagina (normal bloody show or losing your mucus plug). This may happen more than a week before labor begins, or it might occur right before labor begins as the opening of the cervix starts to widen (dilate). For some women, the entire mucus plug passes at once. For others, smaller portions of the mucus plug may gradually pass over several days.   Your baby moving (dropping) lower in your pelvis to get into position for birth (lightening). When this happens, you may feel more pressure on your bladder and pelvic bone and less pressure on your ribs. This may make it easier to breathe. It may also cause you to need to urinate more often and have problems with bowel movements.   Having "practice contractions" (Braxton Hicks contractions) that occur at irregular (unevenly spaced) intervals that are more than 10 minutes apart. This is also called false labor. False labor contractions are common after exercise or sexual activity, and they will stop if you change position, rest, or drink fluids. These contractions are usually mild and do not get stronger over time. They may feel like:  ? A backache or back pain.  ? Mild cramps, similar to menstrual cramps.  ? Tightening or pressure in  your abdomen.  Other early symptoms that labor may be starting soon include:   Nausea or loss of appetite.   Diarrhea.   Having a sudden burst of energy, or feeling very tired.   Mood changes.   Having trouble sleeping.  How will I know when labor has begun?  Signs that true labor has begun may include:   Having contractions that come at regular (evenly spaced) intervals and increase in intensity. This may feel like more intense tightening or pressure in your abdomen that moves to your back.  ? Contractions may also feel like rhythmic pain in your upper thighs or back that comes and goes at regular intervals.  ? For first-time mothers, this change in intensity of contractions often occurs at a more gradual pace.  ? Women who have given birth before may notice a more rapid progression of contraction changes.   Having a feeling of pressure in the vaginal area.   Your water breaking (rupture of membranes). This is when the sac of fluid that surrounds your baby breaks. When this happens, you will notice fluid leaking from your vagina. This may be clear or blood-tinged. Labor usually starts within 24 hours of your water breaking, but it may take longer to begin.  ? Some women notice this as a gush of fluid.  ? Others notice that their underwear repeatedly becomes damp.  Follow these instructions at home:     When labor   starts, or if your water breaks, call your health care provider or nurse care line. Based on your situation, they will determine when you should go in for an exam.   When you are in early labor, you may be able to rest and manage symptoms at home. Some strategies to try at home include:  ? Breathing and relaxation techniques.  ? Taking a warm bath or shower.  ? Listening to music.  ? Using a heating pad on the lower back for pain. If you are directed to use heat:   Place a towel between your skin and the heat source.   Leave the heat on for 20-30 minutes.   Remove the heat if your skin turns  bright red. This is especially important if you are unable to feel pain, heat, or cold. You may have a greater risk of getting burned.  Get help right away if:   You have painful, regular contractions that are 5 minutes apart or less.   Labor starts before you are [redacted] weeks along in your pregnancy.   You have a fever.   You have a headache that does not go away.   You have bright red blood coming from your vagina.   You do not feel your baby moving.   You have a sudden onset of:  ? Severe headache with vision problems.  ? Nausea, vomiting, or diarrhea.  ? Chest pain or shortness of breath.  These symptoms may be an emergency. If your health care provider recommends that you go to the hospital or birth center where you plan to deliver, do not drive yourself. Have someone else drive you, or call emergency services (911 in the [place]  Summary   Labor is your body's natural process of moving your baby, placenta, and umbilical cord out of your uterus.   The process of labor usually starts when your baby is full-term, between 37 and 40 weeks of pregnancy.   When labor starts, or if your water breaks, call your health care provider or nurse care line. Based on your situation, they will determine when you should go in for an exam.  This information is not intended to replace advice given to you by your health care provider. Make sure you discuss any questions you have with your health care provider.  Document Released: 07/02/2016 Document Revised: 07/02/2016 Document Reviewed: 07/02/2016  [employer] Interactive Patient Education  2019 [employer]  Fetal Movement Counts  Patient Name: ________________________________________________ Patient Due Date: ____________________  What is a fetal movement count?    A fetal movement count is the number of times that you feel your baby move during a certain amount of time. This may also be called a fetal kick count. A fetal movement count is recommended for every pregnant  woman. You may be asked to start counting fetal movements as early as week 28 of your pregnancy.  Pay attention to when your baby is most active. You may notice your baby's sleep and wake cycles. You may also notice things that make your baby move more. You should do a fetal movement count:   When your baby is normally most active.   At the same time each day.  A good time to count movements is while you are resting, after having something to eat and drink.  How do I count fetal movements?  1. Find a quiet, comfortable area. Sit, or lie down on your side.  2. Write down the date, the start   time and stop time, and the number of movements that you felt between those two times. Take this information with you to your health care visits.  3. For 2 hours, count kicks, flutters, swishes, rolls, and jabs. You should feel at least 10 movements during 2 hours.  4. You may stop counting after you have felt 10 movements.  5. If you do not feel 10 movements in 2 hours, have something to eat and drink. Then, keep resting and counting for 1 hour. If you feel at least 4 movements during that hour, you may stop counting.  Contact a health care provider if:   You feel fewer than 4 movements in 2 hours.   Your baby is not moving like he or she usually does.  Date: ____________ Start time: ____________ Stop time: ____________ Movements: ____________  Date: ____________ Start time: ____________ Stop time: ____________ Movements: ____________  Date: ____________ Start time: ____________ Stop time: ____________ Movements: ____________  Date: ____________ Start time: ____________ Stop time: ____________ Movements: ____________  Date: ____________ Start time: ____________ Stop time: ____________ Movements: ____________  Date: ____________ Start time: ____________ Stop time: ____________ Movements: ____________  Date: ____________ Start time: ____________ Stop time: ____________ Movements: ____________  Date: ____________ Start time:  ____________ Stop time: ____________ Movements: ____________  Date: ____________ Start time: ____________ Stop time: ____________ Movements: ____________  This information is not intended to replace advice given to you by your health care provider. Make sure you discuss any questions you have with your health care provider.  Document Released: 02/24/2006 Document Revised: 09/24/2015 Document Reviewed: 03/06/2015  [employer] Interactive Patient Education  2019 [employer]

## 2018-05-15 NOTE — MAU Note (Signed)
Pt having lower back pain and contractions that are getting worse. No bleeding or LOF. +FM. Rates pain 8/10.

## 2018-05-17 LAB — CULTURE, OB URINE: Culture: >=100000 — AB

## 2018-05-18 ENCOUNTER — Telehealth: Payer: Self-pay | Admitting: Student

## 2018-05-18 NOTE — Telephone Encounter (Signed)
Attempted to contact patient. No answer.  Pt needs to be treated for UTI.

## 2018-05-19 ENCOUNTER — Other Ambulatory Visit: Payer: Self-pay

## 2018-05-19 ENCOUNTER — Encounter (HOSPITAL_COMMUNITY): Payer: Self-pay | Admitting: *Deleted

## 2018-05-19 ENCOUNTER — Inpatient Hospital Stay (HOSPITAL_COMMUNITY)
Admission: AC | Admit: 2018-05-19 | Discharge: 2018-05-19 | Disposition: A | Discharge status: Home / Self Care | Payer: Medicaid Other | Attending: Obstetrics and Gynecology | Admitting: Obstetrics and Gynecology

## 2018-05-19 DIAGNOSIS — R030 Elevated blood-pressure reading, without diagnosis of hypertension: Secondary | ICD-10-CM | POA: Diagnosis not present

## 2018-05-19 DIAGNOSIS — Z888 Allergy status to other drugs, medicaments and biological substances status: Secondary | ICD-10-CM | POA: Diagnosis not present

## 2018-05-19 DIAGNOSIS — O2343 Unspecified infection of urinary tract in pregnancy, third trimester: Secondary | ICD-10-CM | POA: Diagnosis not present

## 2018-05-19 DIAGNOSIS — O34219 Maternal care for unspecified type scar from previous cesarean delivery: Secondary | ICD-10-CM | POA: Diagnosis not present

## 2018-05-19 DIAGNOSIS — Z87891 Personal history of nicotine dependence: Secondary | ICD-10-CM | POA: Insufficient documentation

## 2018-05-19 DIAGNOSIS — Z881 Allergy status to other antibiotic agents status: Secondary | ICD-10-CM | POA: Diagnosis not present

## 2018-05-19 DIAGNOSIS — O163 Unspecified maternal hypertension, third trimester: Secondary | ICD-10-CM | POA: Diagnosis not present

## 2018-05-19 DIAGNOSIS — Z8759 Personal history of other complications of pregnancy, childbirth and the puerperium: Secondary | ICD-10-CM | POA: Diagnosis not present

## 2018-05-19 DIAGNOSIS — I951 Orthostatic hypotension: Secondary | ICD-10-CM | POA: Diagnosis not present

## 2018-05-19 DIAGNOSIS — Z833 Family history of diabetes mellitus: Secondary | ICD-10-CM | POA: Insufficient documentation

## 2018-05-19 DIAGNOSIS — O9989 Other specified diseases and conditions complicating pregnancy, childbirth and the puerperium: Secondary | ICD-10-CM | POA: Diagnosis not present

## 2018-05-19 DIAGNOSIS — Z3A35 35 weeks gestation of pregnancy: Secondary | ICD-10-CM

## 2018-05-19 DIAGNOSIS — O99013 Anemia complicating pregnancy, third trimester: Secondary | ICD-10-CM | POA: Diagnosis not present

## 2018-05-19 DIAGNOSIS — Z8249 Family history of ischemic heart disease and other diseases of the circulatory system: Secondary | ICD-10-CM | POA: Insufficient documentation

## 2018-05-19 DIAGNOSIS — Z3689 Encounter for other specified antenatal screening: Secondary | ICD-10-CM

## 2018-05-19 DIAGNOSIS — D649 Anemia, unspecified: Secondary | ICD-10-CM | POA: Diagnosis not present

## 2018-05-19 LAB — URINALYSIS, ROUTINE W REFLEX MICROSCOPIC
Bilirubin Urine: NEGATIVE
Glucose, UA: NEGATIVE mg/dL
Hgb urine dipstick: NEGATIVE
Ketones, ur: NEGATIVE mg/dL
Leukocytes,Ua: NEGATIVE
Nitrite: NEGATIVE
Protein, ur: 100 mg/dL — AB
Specific Gravity, Urine: 1.025 (ref 1.005–1.030)
pH: 6 (ref 5.0–8.0)

## 2018-05-19 LAB — COMPREHENSIVE METABOLIC PANEL
ALT: 10 U/L (ref 0–44)
AST: 16 U/L (ref 15–41)
Albumin: 2.6 g/dL — ABNORMAL LOW (ref 3.5–5.0)
Alkaline Phosphatase: 97 U/L (ref 38–126)
Anion gap: 8 (ref 5–15)
BUN: <5 mg/dL — ABNORMAL LOW (ref 6–20)
CO2: 22 mmol/L (ref 22–32)
Calcium: 8.4 mg/dL — ABNORMAL LOW (ref 8.9–10.3)
Chloride: 106 mmol/L (ref 98–111)
Creatinine, Ser: 0.73 mg/dL (ref 0.44–1.00)
GFR calc Af Amer: >60 mL/min (ref >60)
GFR calc non Af Amer: >60 mL/min (ref >60)
Glucose, Bld: 87 mg/dL (ref 70–99)
Potassium: 3.6 mmol/L (ref 3.5–5.1)
Sodium: 136 mmol/L (ref 135–145)
Total Bilirubin: 0.5 mg/dL (ref 0.3–1.2)
Total Protein: 5.8 g/dL — ABNORMAL LOW (ref 6.5–8.1)

## 2018-05-19 LAB — CBC
HCT: 26.9 % — ABNORMAL LOW (ref 36.0–46.0)
Hemoglobin: 8.9 g/dL — ABNORMAL LOW (ref 12.0–15.0)
MCH: 32.1 pg (ref 26.0–34.0)
MCHC: 33.1 g/dL (ref 30.0–36.0)
MCV: 97.1 fL (ref 80.0–100.0)
Platelets: 198 10*3/uL (ref 150–400)
RBC: 2.77 MIL/uL — ABNORMAL LOW (ref 3.87–5.11)
RDW: 13.9 % (ref 11.5–15.5)
WBC: 6.2 10*3/uL (ref 4.0–10.5)
nRBC: 0 % (ref 0.0–0.2)

## 2018-05-19 LAB — PROTEIN / CREATININE RATIO, URINE
Creatinine, Urine: 346.28 mg/dL
Protein Creatinine Ratio: 0.22 mg/mg{Cre} — ABNORMAL HIGH (ref 0.00–0.15)
Total Protein, Urine: 77 mg/dL

## 2018-05-19 MED ORDER — SODIUM CHLORIDE 0.9 % IV SOLN
510.0000 mg | INTRAVENOUS | Status: DC
  Administered 2018-05-19: 510 mg via INTRAVENOUS
  Filled 2018-05-19: qty 17

## 2018-05-19 MED ORDER — SODIUM CHLORIDE 0.9 % IV SOLN
Freq: Once | INTRAVENOUS | Status: AC
  Administered 2018-05-19: 14:00:00 via INTRAVENOUS

## 2018-05-19 MED ORDER — CEFADROXIL 500 MG PO CAPS: 500.0000 mg | ORAL_CAPSULE | Freq: Two times a day (BID) | ORAL | 0 refills | Status: AC

## 2018-05-19 NOTE — Discharge Instructions (Signed)
Pregnancy and Anemia ° °Anemia is a condition in which the concentration of red blood cells, or hemoglobin, in the blood is below normal. Hemoglobin is a substance in red blood cells that carries oxygen to the tissues of the body. Anemia results when enough oxygen does not reach these tissues. °Anemia is common during pregnancy because the woman's body needs more blood volume and blood cells to provide nutrition to the fetus. The fetus needs iron and folic acid as it is developing. Your body may not produce enough red blood cells because of this. Also, during pregnancy, the liquid part of the blood (plasma) increases by about 30-50%, and the red blood cells increase by only 20%. This lowers the concentration of the red blood cells and creates a natural anemia-like situation. °What are the causes? °The most common cause of anemia during pregnancy is not having enough iron in the body to make red blood cells (iron deficiency anemia). Other causes may include: °· Folic acid deficiency. °· Vitamin B12 deficiency. °· Certain prescription or over-the-counter medicines. °· Certain medical conditions or infections that destroy red blood cells. °· A low platelet count and bleeding caused by antibodies that go through the placenta to the fetus from the mother’s blood. °What are the signs or symptoms? °Mild anemia may not be noticeable. If it becomes severe, symptoms may include: °· Feeling tired (fatigue). °· Shortness of breath, especially during activity. °· Weakness. °· Fainting. °· Pale looking skin. °· Headaches. °· A fast or irregular heartbeat (palpitations). °· Dizziness. °How is this diagnosed? °This condition may be diagnosed based on: °· Your medical history and a physical exam. °· Blood tests. °How is this treated? °Treatment for anemia during pregnancy depends on the cause of the anemia. Treatment can include: °· Dietary changes. °· Supplements of iron, vitamin B12, or folic acid. °· A blood transfusion. This may  be needed if anemia is severe. °· Hospitalization. This may be needed if there is a lot of blood loss or severe anemia. °Follow these instructions at home: °· Follow recommendations from your dietitian or health care provider about changing your diet. °· Increase your vitamin C intake. This will help the stomach absorb more iron. Some foods that are high in vitamin C include: °? Oranges. °? Peppers. °? Tomatoes. °? Mangoes. °· Eat a diet rich in iron. This would include foods such as: °? Liver. °? Beef. °? Eggs. °? Whole grains. °? Spinach. °? Dried fruit. °· Take iron and vitamins as told by your health care provider. °· Eat green leafy vegetables. These are a good source of folic acid. °· Keep all follow-up visits as told by your health care provider. This is important. °Contact a health care provider if: °· You have frequent or lasting headaches. °· You look pale. °· You bruise easily. °Get help right away if: °· You have extreme weakness, shortness of breath, or chest pain. °· You become dizzy or have trouble concentrating. °· You have heavy vaginal bleeding. °· You develop a rash. °· You have bloody or black, tarry stools. °· You faint. °· You vomit up blood. °· You vomit repeatedly. °· You have abdominal pain. °· You have a fever. °· You are dehydrated. °Summary °· Anemia is a condition in which the concentration of red blood cells or hemoglobin in the blood is below normal. °· Anemia is common during pregnancy because the woman's body needs more blood volume and blood cells to provide nutrition to the fetus. °· The most   common cause of anemia during pregnancy is not having enough iron in the body to make red blood cells (iron deficiency anemia).  Mild anemia may not be noticeable. If it becomes severe, symptoms may include feeling tired and weak. This information is not intended to replace advice given to you by your health care provider. Make sure you discuss any questions you have with your health care  provider. Document Released: 01/23/2000 Document Revised: 03/02/2016 Document Reviewed: 03/02/2016 Elsevier Interactive Patient Education  2019 [employer] Preeclampsia and Eclampsia  Preeclampsia is a serious condition that may develop during pregnancy. It is also called toxemia of pregnancy. This condition causes high blood pressure along with other symptoms, such as swelling and headaches. These symptoms may develop as the condition gets worse. Preeclampsia may occur at 20 weeks of pregnancy or later. Diagnosing and treating preeclampsia early is very important. If not treated early, it can cause serious problems for you and your baby. One problem it can lead to is eclampsia. Eclampsia is a condition that causes muscle jerking or shaking (convulsions or seizures) and other serious problems for the mother. During pregnancy, delivering your baby may be the best treatment for preeclampsia or eclampsia. For most women, preeclampsia and eclampsia symptoms go away after giving birth. In rare cases, a woman may develop preeclampsia after giving birth (postpartum preeclampsia). This usually occurs within 48 hours after childbirth but may occur up to 6 weeks after giving birth. What are the causes? The cause of preeclampsia is not known. What increases the risk? The following risk factors make you more likely to develop preeclampsia:  Being pregnant for the first time.  Having had preeclampsia during a past pregnancy.  Having a family history of preeclampsia.  Having high blood pressure.  Being pregnant with more than one baby.  Being 31 or older.  Being African-American.  Having kidney disease or diabetes.  Having medical conditions such as lupus or blood diseases.  Being very overweight (obese). What are the signs or symptoms? The earliest signs of preeclampsia are:  High blood pressure.  Increased protein in your urine. Your health care provider will check for this at every visit  before you give birth (prenatal visit). Other symptoms that may develop as the condition gets worse include:  Severe headaches.  Sudden weight gain.  Swelling of the hands, face, legs, and feet.  Nausea and vomiting.  Vision problems, such as blurred or double vision.  Numbness in the face, arms, legs, and feet.  Urinating less than usual.  Dizziness.  Slurred speech.  Abdominal pain, especially upper abdominal pain.  Convulsions or seizures. How is this diagnosed? There are no screening tests for preeclampsia. Your health care provider will ask you about symptoms and check for signs of preeclampsia during your prenatal visits. You may also have tests that include:  Urine tests.  Blood tests.  Checking your blood pressure.  Monitoring your babys heart rate.  Ultrasound. How is this treated? You and your health care provider will determine the treatment approach that is best for you. Treatment may include:  Having more frequent prenatal exams to check for signs of preeclampsia, if you have an increased risk for preeclampsia.  Medicine to lower your blood pressure.  Staying in the hospital, if your condition is severe. There, treatment will focus on controlling your blood pressure and the amount of fluids in your body (fluid retention).  Taking medicine (magnesium sulfate) to prevent seizures. This may be given as an injection or  through an IV.  Taking a low-dose aspirin during your pregnancy.  Delivering your baby early, if your condition gets worse. You may have your labor started with medicine (induced), or you may have a cesarean delivery. Follow these instructions at home: Eating and drinking   Drink enough fluid to keep your urine pale yellow.  Avoid caffeine. Lifestyle  Do not use any products that contain nicotine or tobacco, such as cigarettes and e-cigarettes. If you need help quitting, ask your health care provider.  Do not use alcohol or  drugs.  Avoid stress as much as possible. Rest and get plenty of sleep. General instructions  Take over-the-counter and prescription medicines only as told by your health care provider.  When lying down, lie on your left side. This keeps pressure off your major blood vessels.  When sitting or lying down, raise (elevate) your feet. Try putting some pillows underneath your lower legs.  Exercise regularly. Ask your health care provider what kinds of exercise are best for you.  Keep all follow-up and prenatal visits as told by your health care provider. This is important. How is this prevented? There is no known way of preventing preeclampsia or eclampsia from developing. However, to lower your risk of complications and detect problems early:  Get regular prenatal care. Your health care provider may be able to diagnose and treat the condition early.  Maintain a healthy weight. Ask your health care provider for help managing weight gain during pregnancy.  Work with your health care provider to manage any long-term (chronic) health conditions you have, such as diabetes or kidney problems.  You may have tests of your blood pressure and kidney function after giving birth.  Your health care provider may have you take low-dose aspirin during your next pregnancy. Contact a health care provider if:  You have symptoms that your health care provider told you may require more treatment or monitoring, such as: ? Headaches. ? Nausea or vomiting. ? Abdominal pain. ? Dizziness. ? Light-headedness. Get help right away if:  You have severe: ? Abdominal pain. ? Headaches that do not get better. ? Dizziness. ? Vision problems. ? Confusion. ? Nausea or vomiting.  You have any of the following: ? A seizure. ? Sudden, rapid weight gain. ? Sudden swelling in your hands, ankles, or face. ? Trouble moving any part of your body. ? Numbness in any part of your body. ? Trouble speaking. ? Abnormal  bleeding.  You faint. Summary  Preeclampsia is a serious condition that may develop during pregnancy. It is also called toxemia of pregnancy.  This condition causes high blood pressure along with other symptoms, such as swelling and headaches.  Diagnosing and treating preeclampsia early is very important. If not treated early, it can cause serious problems for you and your baby.  Get help right away if you have symptoms that your health care provider told you to watch for. This information is not intended to replace advice given to you by your health care provider. Make sure you discuss any questions you have with your health care provider. Document Released: 01/23/2000 Document Revised: 01/11/2017 Document Reviewed: 09/01/2015 Elsevier Interactive Patient Education  2019 [employer]. Preterm Labor and Birth Information  The normal length of a pregnancy is 39-41 weeks. Preterm labor is when labor starts before 37 completed weeks of pregnancy. What are the risk factors for preterm labor? Preterm labor is more likely to occur in women who:  Have certain infections during pregnancy such as  a bladder infection, sexually transmitted infection, or infection inside the uterus (chorioamnionitis).  Have a shorter-than-normal cervix.  Have gone into preterm labor before.  Have had surgery on their cervix.  Are younger than age 46 or older than age 16.  Are African American.  Are pregnant with twins or multiple babies (multiple gestation).  Take street drugs or smoke while pregnant.  Do not gain enough weight while pregnant.  Became pregnant shortly after having been pregnant. What are the symptoms of preterm labor? Symptoms of preterm labor include:  Cramps similar to those that can happen during a menstrual period. The cramps may happen with diarrhea.  Pain in the abdomen or lower back.  Regular uterine contractions that may feel like tightening of the abdomen.  A feeling of  increased pressure in the pelvis.  Increased watery or bloody mucus discharge from the vagina.  Water breaking (ruptured amniotic sac). Why is it important to recognize signs of preterm labor? It is important to recognize signs of preterm labor because babies who are born prematurely may not be fully developed. This can put them at an increased risk for:  Long-term (chronic) heart and lung problems.  Difficulty immediately after birth with regulating body systems, including blood sugar, body temperature, heart rate, and breathing rate.  Bleeding in the brain.  Cerebral palsy.  Learning difficulties.  Death. These risks are highest for babies who are born before 32 weeks of pregnancy. How is preterm labor treated? Treatment depends on the length of your pregnancy, your condition, and the health of your baby. It may involve:  Having a stitch (suture) placed in your cervix to prevent your cervix from opening too early (cerclage).  Taking or being given medicines, such as: ? Hormone medicines. These may be given early in pregnancy to help support the pregnancy. ? Medicine to stop contractions. ? Medicines to help mature the babys lungs. These may be prescribed if the risk of delivery is high. ? Medicines to prevent your baby from developing cerebral palsy. If the labor happens before 34 weeks of pregnancy, you may need to stay in the hospital. What should I do if I think I am in preterm labor? If you think that you are going into preterm labor, call your health care provider right away. How can I prevent preterm labor in future pregnancies? To increase your chance of having a full-term pregnancy:  Do not use any tobacco products, such as cigarettes, chewing tobacco, and e-cigarettes. If you need help quitting, ask your health care provider.  Do not use street drugs or medicines that have not been prescribed to you during your pregnancy.  Talk with your health care provider before  taking any herbal supplements, even if you have been taking them regularly.  Make sure you gain a healthy amount of weight during your pregnancy.  Watch for infection. If you think that you might have an infection, get it checked right away.  Make sure to tell your health care provider if you have gone into preterm labor before. This information is not intended to replace advice given to you by your health care provider. Make sure you discuss any questions you have with your health care provider. Document Released: 04/17/2003 Document Revised: 07/08/2015 Document Reviewed: 06/18/2015 Elsevier Interactive Patient Education  2019 [employer]. Pregnancy and Urinary Tract Infection What is a urinary tract infection?  A urinary tract infection (UTI) is an infection of any part of the urinary tract. This includes the kidneys, the  tubes that connect your kidneys to your bladder (ureters), the bladder, and the tube that carries urine out of your body (urethra). These organs make, store, and get rid of urine in the body.  An upper UTI affects the ureters and kidneys (pyelonephritis), and a lower UTI affects the bladder (cystitis) and urethra (urethritis). Most urinary tract infections are caused by bacteria in your genital area, around the entrance to your urinary tract (urethra). These bacteria grow and cause irritation and inflammation of your urinary tract. Why am I more likely to get a UTI during pregnancy? You are more likely to develop a UTI during pregnancy because:  The physical and hormonal changes your body goes through can make it easier for bacteria to get into your urinary tract.  Your growing baby puts pressure on your uterus and can affect urine flow. Does a UTI place my baby at risk? An untreated UTI during pregnancy could lead to a kidney infection, which can cause health problems that could affect your baby. Possible complications of an untreated UTI include:  Having your baby  before 37 weeks of pregnancy (premature).  Having a baby with a low birth weight.  Developing high blood pressure during pregnancy (preeclampsia).  Having a low hemoglobin level (anemia). What are the symptoms of a UTI? Symptoms of a UTI include:  Needing to urinate right away (urgently).  Frequent urination or passing small amounts of urine frequently.  Pain or burning with urination.  Blood in the urine.  Urine that smells bad or unusual.  Trouble urinating.  Cloudy urine.  Pain in the abdomen or lower back.  Vaginal discharge. You may also have:  Vomiting or a decreased appetite.  Confusion.  Irritability or tiredness.  A fever.  Diarrhea. What are the treatment options for a UTI during pregnancy? Treatment for this condition may include:  Antibiotic medicines that are safe to take during pregnancy.  Other medicines to treat less common causes of UTI. How can I prevent a UTI? To prevent a UTI:  Go to the bathroom as soon as you feel the need. Do not hold urine for long periods of time.  Always wipe from front to back after a bowel movement. Use each tissue one time when you wipe.  Empty your bladder after sex.  Keep your genital area dry.  Drink 6-10 glasses of water each day.  Do not douche or use deodorant sprays. Contact a health care provider if:  Your symptoms do not improve or they get worse.  You have abnormal vaginal discharge. Get help right away if:  You have a fever.  You have nausea and vomiting.  You have back or side pain.  You feel contractions in your uterus.  You have lower belly pain.  You have a gush of fluid from your vagina.  You have blood in your urine. Summary  A urinary tract infection (UTI) is an infection of any part of the urinary tract, which includes the kidneys, ureters, bladder, and urethra.  Most urinary tract infections are caused by bacteria in your genital area, around the entrance to your urinary  tract (urethra).  You are more likely to develop a UTI during pregnancy.  If you were prescribed an antibiotic, take it as told by your health care provider. Do not stop taking the antibiotic even if you start to feel better. This information is not intended to replace advice given to you by your health care provider. Make sure you discuss any questions you  have with your health care provider. Document Released: 05/22/2010 Document Revised: 03/22/2017 Document Reviewed: 12/16/2014 Elsevier Interactive Patient Education  [employer].

## 2018-05-19 NOTE — MAU Provider Note (Signed)
History     CSN: [id number]  Arrival date and time: 05/19/18 1021    Chief Complaint  Patient presents with  . Contractions   Ms. Jennifer Booth is a 33 y.o. [phone] at [redacted]w[redacted]d who presents to MAU for ctx.  Onset: 05/15/2018 (pt was seen in MAU on this date for ctx) Location: abdomen/pelvis Duration: 4 days Character: "menstrual-like cramping" Aggravating/Associated: none/dull, aching LBP Relieving: none Timing: q8-5min Severity: 5/10  Pt also has a hx of preeclampsia in a previous pregnancy and had elevated pressures upon arrival to MAU. Pt denies HA, blurry vision/seeing spots, N/V, epigastric pain, swelling in face and hands, sudden weight gain. Pt denies chest pain and SOB.  Pt denies constipation, diarrhea, or urinary problems. Pt denies fever, chills, fatigue, sweating or changes in appetite. Pt denies dizziness, light-headedness, weakness.  Pt denies VB, LOF and reports good FM. Pt denies vaginal discharge/odor/itching.  Problems this pregnancy include: hx of PTD, low AFI (AFI 16.8 on [place] 05/09/2018). Allergies? Cipro, zofran Current medications/supplements? PNVs, iron, Tums Prenatal care provider? [facility], next appt 05/22/2018    OB History    Gravida  4   Para  3   Term  2   Preterm  1   AB  0   Living  4     SAB  0   TAB  0   Ectopic  0   Multiple  1   Live Births  4           Past Medical History:  Diagnosis Date  . Pre-eclampsia 2018    Past Surgical History:  Procedure Laterality Date  . CESAREAN SECTION      Family History  Problem Relation Age of Onset  . Hypertension Mother   . Diabetes Mother   . Hypercholesterolemia Father   . Hypertension Father   . Heart disease Maternal Grandmother   . Diabetes Maternal Grandmother     Social History   Tobacco Use  . Smoking status: Former Research scientist (life sciences)  . Smokeless tobacco: Never Used  Substance Use Topics  . Alcohol use: Not Currently  . Drug use: Never    Allergies:   Allergies  Allergen Reactions  . Ciprofloxacin Anaphylaxis  . Zofran [Ondansetron Hcl] Anaphylaxis    Medications Prior to Admission  Medication Sig Dispense Refill Last Dose  . calcium carbonate (TUMS - DOSED IN MG ELEMENTAL CALCIUM) 500 MG chewable tablet Chew 1 tablet by mouth 4 (four) times daily as needed for indigestion or heartburn.   05/19/2018 at Unknown time  . ferrous sulfate 325 (65 FE) MG tablet Take 1 tablet (325 mg total) by mouth daily. 30 tablet 0 05/19/2018 at Unknown time  . Prenatal Vit-Fe Fumarate-FA (PRENATAL MULTIVITAMIN) TABS tablet Take 1 tablet by mouth daily at 12 noon.   05/15/2018 at Unknown time    Review of Systems  Constitutional: Negative for appetite change, chills, diaphoresis, fatigue and fever.  Respiratory: Negative for shortness of breath.   Cardiovascular: Negative for chest pain.  Gastrointestinal: Negative for abdominal pain, constipation, diarrhea, nausea and vomiting.  Genitourinary: Positive for pelvic pain. Negative for dysuria, flank pain, frequency, urgency, vaginal bleeding and vaginal discharge.  Musculoskeletal: Positive for back pain.  Neurological: Negative for dizziness, weakness, light-headedness and headaches.   Physical Exam   Blood pressure 135/82, pulse 86, temperature 98 F (36.7 C), resp. rate 18, last menstrual period 09/11/2017, SpO2 100 %.   Patient Vitals for the past 24 hrs:  BP Temp Pulse Resp SpO2  05/19/18 1400 135/82 - 86 18 100 %  05/19/18 1245 114/74 - 85 - -  05/19/18 1242 123/69 - (!) 177 - -  05/19/18 1239 113/88 - 86 - -  05/19/18 1235 - - - - 100 %  05/19/18 1216 116/71 - 93 - -  05/19/18 1125 - - - - 98 %  05/19/18 1120 - - - - 99 %  05/19/18 1115 123/75 - 91 - 100 %  05/19/18 1110 - - - - 99 %  05/19/18 1105 - - - - 99 %  05/19/18 1101 129/88 - - - -  05/19/18 1045 (!) 158/95 - 88 - -  05/19/18 1040 (!) 149/97 98 F (36.7 C) 96 - 100 %   Orthostatic VS for the past 24 hrs:  BP- Lying Pulse-  Lying BP- Sitting Pulse- Sitting BP- Standing at 0 minutes Pulse- Standing at 0 minutes  05/19/18 1406 119/76 78 129/87 88 118/74 85  05/19/18 1235 124/80 79 120/81 78 113/88 88    Physical Exam  Constitutional: She is oriented to person, place, and time. She appears well-developed and well-nourished. No distress.  HENT:  Head: Normocephalic and atraumatic.  Respiratory: Effort normal.  GI: Soft. She exhibits no distension and no mass. There is no abdominal tenderness. There is no rebound and no guarding.  Genitourinary: There is no rash, tenderness or lesion on the right labia. There is no rash, tenderness or lesion on the left labia.    Genitourinary Comments: CE: posterior/30/1   Neurological: She is alert and oriented to person, place, and time.  Skin: Skin is warm and dry. She is not diaphoretic.  Psychiatric: She has a normal mood and affect. Her behavior is normal.   Results for orders placed or performed during the Booth encounter of 05/19/18 (from the past 24 hour(s))  Protein / creatinine ratio, urine     Status: Abnormal   Collection Time: 05/19/18 10:54 AM  Result Value Ref Range   Creatinine, Urine 346.28 mg/dL   Total Protein, Urine 77 mg/dL   Protein Creatinine Ratio 0.22 (H) 0.00 - 0.15 mg/mg[Cre]  Urinalysis, Routine w reflex microscopic     Status: Abnormal   Collection Time: 05/19/18 11:16 AM  Result Value Ref Range   Color, Urine YELLOW YELLOW   APPearance HAZY (A) CLEAR   Specific Gravity, Urine 1.025 1.005 - 1.030   pH 6.0 5.0 - 8.0   Glucose, UA NEGATIVE NEGATIVE mg/dL   Hgb urine dipstick NEGATIVE NEGATIVE   Bilirubin Urine NEGATIVE NEGATIVE   Ketones, ur NEGATIVE NEGATIVE mg/dL   Protein, ur 100 (A) NEGATIVE mg/dL   Nitrite NEGATIVE NEGATIVE   Leukocytes,Ua NEGATIVE NEGATIVE   RBC / HPF 0-5 0 - 5 RBC/hpf   WBC, UA 0-5 0 - 5 WBC/hpf   Bacteria, UA MANY (A) NONE SEEN   Squamous Epithelial / LPF 0-5 0 - 5   Mucus PRESENT   Comprehensive metabolic  panel     Status: Abnormal   Collection Time: 05/19/18 11:25 AM  Result Value Ref Range   Sodium 136 135 - 145 mmol/L   Potassium 3.6 3.5 - 5.1 mmol/L   Chloride 106 98 - 111 mmol/L   CO2 22 22 - 32 mmol/L   Glucose, Bld 87 70 - 99 mg/dL   BUN <5 (L) 6 - 20 mg/dL   Creatinine, Ser 0.73 0.44 - 1.00 mg/dL   Calcium 8.4 (L) 8.9 - 10.3 mg/dL   Total Protein 5.8 (L) 6.5 -  8.1 g/dL   Albumin 2.6 (L) 3.5 - 5.0 g/dL   AST 16 15 - 41 U/L   ALT 10 0 - 44 U/L   Alkaline Phosphatase 97 38 - 126 U/L   Total Bilirubin 0.5 0.3 - 1.2 mg/dL   GFR calc non Af Amer >60 >60 mL/min   GFR calc Af Amer >60 >60 mL/min   Anion gap 8 5 - 15  CBC     Status: Abnormal   Collection Time: 05/19/18 11:25 AM  Result Value Ref Range   WBC 6.2 4.0 - 10.5 K/uL   RBC 2.77 (L) 3.87 - 5.11 MIL/uL   Hemoglobin 8.9 (L) 12.0 - 15.0 g/dL   HCT 26.9 (L) 36.0 - 46.0 %   MCV 97.1 80.0 - 100.0 fL   MCH 32.1 26.0 - 34.0 pg   MCHC 33.1 30.0 - 36.0 g/dL   RDW 13.9 11.5 - 15.5 %   Platelets 198 150 - 400 K/uL   nRBC 0.0 0.0 - 0.2 %    [place] Mfm Ob Detail +14 Wk  Result Date: 05/02/2018 ----------------------------------------------------------------------  OBSTETRICS REPORT                       (Signed Final 05/02/2018 08:54 am) ---------------------------------------------------------------------- Patient Info  ID #:       [id number]                          D.O.B.:  06/30/1985 (33 yrs)  Name:       Jennifer Booth                 Visit Date: 05/02/2018 08:28 am ---------------------------------------------------------------------- Performed By  Performed By:     Berlinda Last          Ref. Address:     [address], [place]                                                             [place], [place]  Attending:        Tama High MD        [facility]  Referred By:      Shelly Bombard MD ---------------------------------------------------------------------- Orders   #  Description                          Code         Ordered By   1  [place] MFM OB DETAIL +14 WK              76811.01     Baltazar Najjar  ----------------------------------------------------------------------   #  Order #                    Accession #                 Episode #   1  [id number]                  [id number]                  [id number]  ---------------------------------------------------------------------- Indications   Encounter for antenatal screening for          Z36.3   malformations   [redacted] weeks gestation of pregnancy                Z3A.33   Late to prenatal care, third trimester         O09.33   Previous cesarean delivery, antepartum         O34.219   Poor obstetric history: Previous preterm       O09.219   delivery, antepartum (twins @ 32 weeks)  ---------------------------------------------------------------------- Fetal Evaluation  Num Of Fetuses:         1  Fetal Heart Rate(bpm):  159  Cardiac Activity:       Observed  Presentation:           Transverse, head to maternal left  Placenta:               Anterior  P. Cord Insertion:      Not well visualized  Amniotic Fluid  AFI FV:      Subjectively decreased  AFI Sum(cm)     %Tile       Largest Pocket(cm)  6.63            < 3         2.92  RUQ(cm)                     LUQ(cm)        LLQ(cm)  2.92                        1.75           1.96 ---------------------------------------------------------------------- Biometry  BPD:      77.7  mm     G. Age:  31w 1d          3  %    CI:        70.31   %    70 - 86  FL/HC:      20.5   %    19.9 - 21.5  HC:      295.5  mm     G. Age:  32w 5d          8  %    HC/AC:      0.99        0.96 - 1.11  AC:      297.2  mm     G. Age:  33w 5d         64  %    FL/BPD:      78.0   %    71 - 87  FL:       60.6  mm     G. Age:  31w 4d          7  %    FL/AC:      20.4   %    20 - 24  HUM:      55.4  mm     G. Age:  32w 2d         37  %  Est. FW:    2037  gm      4 lb 8 oz     47  % ---------------------------------------------------------------------- OB History  Gravidity:    4         Term:   2        Prem:   1  Living:       4 ---------------------------------------------------------------------- Gestational Age  LMP:           33w 2d        Date:  09/11/17                 EDD:   06/18/18  U/S Today:     32w 2d                                        EDD:   06/25/18  Best:          33w 2d     Det. By:  LMP  (09/11/17)          EDD:   06/18/18 ---------------------------------------------------------------------- Anatomy  Cranium:               Appears normal         Aortic Arch:            Appears normal  Cavum:                 Appears normal         Ductal Arch:            Not well visualized  Ventricles:            Appears normal         Diaphragm:              Appears normal  Choroid Plexus:        Appears normal         Stomach:                Appears normal, left  sided  Cerebellum:            Appears normal         Abdomen:                Appears normal  Posterior Fossa:       Appears normal         Abdominal Wall:         Not well visualized  Nuchal Fold:           Not applicable (>83    Cord Vessels:           Appears normal ([redacted]                         wks GA)                                        vessel cord)  Face:                  Not well visualized    Kidneys:                Appear normal  Lips:                  Appears normal         Bladder:                Appears normal  Thoracic:              Appears normal         Spine:                  Appears normal  Heart:                 Appears normal         Upper Extremities:      Visualized                         (4CH, axis, and                          situs)  RVOT:                  Not well visualized    Lower Extremities:      Visualized  LVOT:                  Not well visualized  Other:  Technically difficult due to low amniotic fluid. Technically difficult due          to fetal position. ---------------------------------------------------------------------- Cervix Uterus Adnexa  Cervix  Not visualized (advanced GA >24wks) ---------------------------------------------------------------------- Impression  Ms. [place], [place], is here for fetal anatomy scan.  She relocated from [place] in Nov 2019 and the gestational  age is established by LMP date that is consistent with 8-week  ultrasound she had in [place].  Obstetric history is significant for 2 term vaginal deliveries  followed by a preterm (32 weeks) delivery of twins. Patient  reports no chronic medical conditions.  On cell-free fetal DNA screening, the risks of fetal  aneuploidies are not increased. Patient has not had  screening for gestational diabetes yet.  Fetal growth is appropriate for gestational age. Amniotic fluid  is decreased for this  gestational age (AFI=7 cm) and good  fetal activity is seen. Fetal anatomy appears normal, but  limited by advanced gestational age.  Patient does not give history of leakage of amniotic fluid. I  encouraged oral hydration. ---------------------------------------------------------------------- Recommendations  -An appointment was made for her to return next week for  amniotic fluid assessment. ----------------------------------------------------------------------                  Tama High, MD Electronically Signed Final Report   05/02/2018 08:54 am ----------------------------------------------------------------------  [place] Mfm Ob Limited  Result Date: 05/09/2018 ----------------------------------------------------------------------  OBSTETRICS REPORT                        (Signed Final 05/09/2018 10:43 am)  ---------------------------------------------------------------------- Patient Info  ID #:       [id number]                          D.O.B.:  1985-06-13 (33 yrs)  Name:       Jennifer Booth                 Visit Date: 05/09/2018 09:36 am ---------------------------------------------------------------------- Performed By  Performed By:     Hubert Azure          Secondary Phy.:    [employer]                                                              CNM  Attending:        Tama High MD        Address:           [place]  Referred By:      [place] for Baron Hamper MD                                 Fetal Care  Ref. Address:     [address] [place]                    [place], [place] ---------------------------------------------------------------------- Orders   #  Description  Code         Ordered By   1  [place] MFM OB LIMITED                    [id number]     [facility]  ----------------------------------------------------------------------   #  Order #                    Accession #                 Episode #   1  [id number]                  [id number]                  [id number]  ---------------------------------------------------------------------- Indications   Late to prenatal care, third trimester         O09.33   Previous cesarean delivery, antepartum         O34.219   Poor obstetric history: Previous preterm       O09.219   delivery, antepartum (twins @ 32 weeks)   Oligohydramnios / Decreased amniotic fluid     O41.00X0   volume   [redacted] weeks gestation of pregnancy                Z3A.34  ---------------------------------------------------------------------- Fetal Evaluation  Num Of Fetuses:          1  Fetal Heart Rate(bpm):   134  Cardiac Activity:        Observed  Presentation:             Cephalic  Placenta:                Anterior  P. Cord Insertion:       Visualized  Amniotic Fluid  AFI FV:      Within normal limits  AFI Sum(cm)     %Tile       Largest Pocket(cm)  16.8            61          5.29  RUQ(cm)       RLQ(cm)       LUQ(cm)        LLQ(cm)  2.83          4.31          4.37           5.29  Comment:    Stomach and bladder noted. ---------------------------------------------------------------------- OB History  Gravidity:    4         Term:   2        Prem:   1  Living:       4 ---------------------------------------------------------------------- Gestational Age  LMP:           34w 2d        Date:  09/11/17                 EDD:   06/18/18  Best:          34w 2d     Det. By:  LMP  (09/11/17)          EDD:   06/18/18 ---------------------------------------------------------------------- Impression  Patient returned for amniotic fluid assessment. A limited  ultrasound study was performed. Significant improvement is  seen in amniotic fluid. Good fetal activity is seen. ---------------------------------------------------------------------- Recommendations  An appointment was made for her to return in 3 weeks for  fetal growth and  amniotic fluid assessments. ----------------------------------------------------------------------                  Tama High, MD Electronically Signed Final Report   05/09/2018 10:43 am ----------------------------------------------------------------------  [place] Mfm Ob Limited  Result Date: 04/24/2018 ----------------------------------------------------------------------  OBSTETRICS REPORT                       (Signed Final 04/24/2018 02:25 pm) ---------------------------------------------------------------------- Patient Info  ID #:       [id number]                          D.O.B.:  [birth year] (33 yrs)  Name:       Jennifer Booth                 Visit Date: 04/23/2018 02:26 pm ---------------------------------------------------------------------- Performed  By  Performed By:     Wilnette Kales        Secondary Phy.:    Jorje Guild                    RDMS,RVT                                                              CNM  Attending:        Sander Nephew      Address:           [place]  Referred By:      [facility]U Nursing-           [facility]                                [place] ---------------------------------------------------------------------- Orders   #  Description                          Code         Ordered By   1  [place] MFM OB LIMITED                    90300.92     Jorje Guild  ----------------------------------------------------------------------   #  Order #                    Accession #                 Episode #   1  [id number]  [id number]                  [id number]  ---------------------------------------------------------------------- Indications   [redacted] weeks gestation of pregnancy                Z3A.32   Traumatic injury during pregnancy              O9A.219 T14.90  ---------------------------------------------------------------------- Fetal Evaluation  Num Of Fetuses:          1  Fetal Heart Rate(bpm):   150  Cardiac Activity:        Observed  Presentation:            Cephalic  Placenta:                Anterior  P. Cord Insertion:       Visualized, central  Amniotic Fluid  AFI FV:      Within normal limits  AFI Sum(cm)     %Tile       Largest Pocket(cm)  14.1            48          5.3  RUQ(cm)       RLQ(cm)       LUQ(cm)        LLQ(cm)  2.57          2.57          5.3            3.66  Comment:    No placental abruption or previa identified. ---------------------------------------------------------------------- OB History  Gravidity:    4         Term:   2        Prem:   1  Living:       4 ---------------------------------------------------------------------- Gestational Age   LMP:           32w 0d        Date:  09/11/17                 EDD:   06/18/18  Best:          Milderd Meager 0d     Det. By:  LMP  (09/11/17)          EDD:   06/18/18 ---------------------------------------------------------------------- Anatomy  Ventricles:            Appears normal         Kidneys:                Appear normal  Heart:                 Appears normal         Bladder:                Appears normal                         (4CH, axis, and                         situs)  Stomach:               Appears normal, left                         sided ---------------------------------------------------------------------- Cervix Uterus Adnexa  Cervix  Normal appearance by transabdominal scan.  Left Ovary  No adnexal mass visualized.  Right Ovary  No adnexal mass visualized.  Cul De Sac  No free fluid seen.  Adnexa  No abnormality visualized. ---------------------------------------------------------------------- Impression  Maternal trauma  No evidence of placenta previa or placental abruption.  Appearance of mild amount of soft tissue edema in the  uterine wall. ---------------------------------------------------------------------- Recommendations  Follow up as clinically indicated. ----------------------------------------------------------------------               Sander Nephew, MD Electronically Signed Final Report   04/24/2018 02:25 pm ----------------------------------------------------------------------   MAU Course  Procedures  MDM -r/o PTL       -CE: posterior/30/1 (CE on 05/15/2018 1/thick/posterior)       -after 2hr, CE: unchanged       -pt seen in MAU 05/15/2018, urine culture sent, +for UTI, pt unable to be reached        to start tx, will send RX for UTI today       -UA: hazy/100PRO/many bacteria, all else WNL -EFM:       -baseline: 140/130/135       -variability: moderate       -accels: positive (15x15)       -decels: single variable       -TOCO: irregular ctx -r/o preeclampsia        -BPs during stay improved to normal range prior to discharge       -CBC: H/H 8.9/26.9, platelets 198 (down from 227 35mo ago)           -orthostatic VS: drop in systolic showing orthostasis, pt reports              light-headedness after assessment           -feraheme/1L NS given           -orthostatic VS after administration: improved, but still showing orthostasis, pt              denies sx, states lightheadedness absent and feels improvement after              fluids/feraheme           -second Feraheme infusion scheduled for 05/26/2018 @1300        -CMP: WNL for pregnancy       -PRO/Creat: 0.22 (0.243 on 05/03/2018) -pt discharged to home in stable condition  Orders Placed This Encounter  Procedures  . Comprehensive metabolic panel    Standing Status:   Standing    Number of Occurrences:   1  . CBC    Standing Status:   Standing    Number of Occurrences:   1  . Protein / creatinine ratio, urine    Standing Status:   Standing    Number of Occurrences:   1  . Urinalysis, Routine w reflex microscopic    Standing Status:   Standing    Number of Occurrences:   1  . Orthostatic vital signs    Standing Status:   Standing    Number of Occurrences:   1  . Discharge patient    Order Specific Question:   Discharge disposition    Answer:   01-Home or Self Care [1]    Order Specific Question:   Discharge patient date    Answer:   05/19/2018   Meds ordered this encounter  Medications  . cefadroxil (DURICEF) 500 MG capsule    Sig: Take 1 capsule (500 mg total) by mouth 2 (two) times daily for 7 days.    Dispense:  14 capsule  Refill:  0    Order Specific Question:   Supervising Provider    Answer:   Aletha Halim [id number]  . ferumoxytol (FERAHEME) 510 mg in sodium chloride 0.9 % 100 mL IVPB  . 0.9 %  sodium chloride infusion   Assessment and Plan   1. Urinary tract infection in pregnancy in third trimester, antepartum   2. [redacted] weeks gestation of pregnancy   3. NST (non-stress  test) reactive   4. Elevated blood pressure affecting pregnancy in third trimester, antepartum   5. Anemia affecting pregnancy in third trimester   6. Orthostasis    Allergies as of 05/19/2018      Reactions   Ciprofloxacin Anaphylaxis   Zofran [ondansetron Hcl] Anaphylaxis      Medication List    TAKE these medications   calcium carbonate 500 MG chewable tablet Commonly known as:  TUMS - dosed in mg elemental calcium Chew 1 tablet by mouth 4 (four) times daily as needed for indigestion or heartburn.   cefadroxil 500 MG capsule Commonly known as:  DURICEF Take 1 capsule (500 mg total) by mouth 2 (two) times daily for 7 days.   ferrous sulfate 325 (65 FE) MG tablet Take 1 tablet (325 mg total) by mouth daily.   prenatal multivitamin Tabs tablet Take 1 tablet by mouth daily at 12 noon.      -encouraged increased water intake -RX sent for UTI, pt advised to start ABX today -pyelo precautions/return MAU precautions given -preeclampsia/PTL s/sx reviewed -2nd Feraheme infusion scheduled for 05/26/2018 @1300 , please come to the Main Entrance for [facility], pt aware -discuss 2nd Feraheme infusion with Dr. Rosana Hoes during your appt on Monday -pt discharged to home in stable condition  Elmyra Ricks E Orma Cheetham 05/19/2018, 2:40 PM

## 2018-05-19 NOTE — MAU Note (Signed)
Pt presents to MAU with complaints of contractions for a couple of days. PT is planning on having a repeat C/S on May the 3rd, wants a BTL

## 2018-05-22 ENCOUNTER — Inpatient Hospital Stay (EMERGENCY_DEPARTMENT_HOSPITAL)
Admission: AD | Admit: 2018-05-22 | Discharge: 2018-05-22 | Disposition: A | Discharge status: Home / Self Care | Payer: Medicaid Other | Source: Home / Self Care | Attending: Obstetrics & Gynecology | Admitting: Obstetrics & Gynecology

## 2018-05-22 ENCOUNTER — Other Ambulatory Visit: Payer: Self-pay

## 2018-05-22 ENCOUNTER — Encounter: Payer: Medicaid Other | Admitting: Obstetrics and Gynecology

## 2018-05-22 ENCOUNTER — Encounter (HOSPITAL_COMMUNITY): Payer: Self-pay | Admitting: *Deleted

## 2018-05-22 ENCOUNTER — Inpatient Hospital Stay (HOSPITAL_COMMUNITY)
Admission: AD | Admit: 2018-05-22 | Discharge: 2018-05-22 | Disposition: A | Discharge status: Home / Self Care | Payer: Medicaid Other | Source: Ambulatory Visit | Attending: Obstetrics and Gynecology | Admitting: Obstetrics and Gynecology

## 2018-05-22 DIAGNOSIS — Z3009 Encounter for other general counseling and advice on contraception: Secondary | ICD-10-CM

## 2018-05-22 DIAGNOSIS — Z3689 Encounter for other specified antenatal screening: Secondary | ICD-10-CM | POA: Diagnosis not present

## 2018-05-22 DIAGNOSIS — O479 False labor, unspecified: Secondary | ICD-10-CM

## 2018-05-22 DIAGNOSIS — Z3480 Encounter for supervision of other normal pregnancy, unspecified trimester: Secondary | ICD-10-CM

## 2018-05-22 DIAGNOSIS — Z3A36 36 weeks gestation of pregnancy: Secondary | ICD-10-CM

## 2018-05-22 DIAGNOSIS — O4703 False labor before 37 completed weeks of gestation, third trimester: Secondary | ICD-10-CM

## 2018-05-22 DIAGNOSIS — O09299 Supervision of pregnancy with other poor reproductive or obstetric history, unspecified trimester: Secondary | ICD-10-CM

## 2018-05-22 HISTORY — DX: Anemia, unspecified: D64.9

## 2018-05-22 LAB — URINALYSIS, ROUTINE W REFLEX MICROSCOPIC
Bilirubin Urine: NEGATIVE
Glucose, UA: NEGATIVE mg/dL
Hgb urine dipstick: NEGATIVE
Ketones, ur: NEGATIVE mg/dL
Leukocytes,Ua: NEGATIVE
Nitrite: NEGATIVE
Protein, ur: 30 mg/dL — AB
Specific Gravity, Urine: 1.02 (ref 1.005–1.030)
pH: 6 (ref 5.0–8.0)

## 2018-05-22 NOTE — MAU Note (Signed)
Pt c/o contractions that have increased throughout the day. Was here this morning and d/c w/ labor precautions. Now has N/D and lightheadedness. Now 4-5 mins apart. Denies LOF or bleeding. Rating pain 8/10.

## 2018-05-22 NOTE — MAU Note (Signed)
Presents with c/o worsening ctxs since the weekend.  Reports ctxs are every 5 minutes.  Denies LOF or VB, states lost mucus plug.  Reports +FM.

## 2018-05-22 NOTE — MAU Provider Note (Signed)
Pt informed that the ultrasound is considered a limited OB ultrasound and is not intended to be a complete ultrasound exam.  Patient also informed that the ultrasound is not being completed with the intent of assessing for fetal or placental anomalies or any pelvic abnormalities.  Explained that the purpose of today's ultrasound is to assess for  presentation.  Presentation: VTX. Patient acknowledges the purpose of the exam and the limitations of the study.  Clarisa Fling, NP  10:53 AM 05/22/2018

## 2018-05-22 NOTE — MAU Provider Note (Signed)
S: Patient is here for RN labor evaluation. Strip, vital signs, & chart Reviewed   O:  Vitals:   05/22/18 2043 05/22/18 2105 05/22/18 2148  BP: 129/89 124/84 106/68  Pulse:  97 87  Resp: 18 16   Temp: 98.7 F (37.1 C)    TempSrc: Oral    Weight: 116.7 kg    Height: 5\' 5"  (1.651 m)     Results for orders placed or performed during the hospital encounter of 05/22/18 (from the past 24 hour(s))  Urinalysis, Routine w reflex microscopic     Status: Abnormal   Collection Time: 05/22/18  9:46 AM  Result Value Ref Range   Color, Urine YELLOW YELLOW   APPearance HAZY (A) CLEAR   Specific Gravity, Urine 1.020 1.005 - 1.030   pH 6.0 5.0 - 8.0   Glucose, UA NEGATIVE NEGATIVE mg/dL   Hgb urine dipstick NEGATIVE NEGATIVE   Bilirubin Urine NEGATIVE NEGATIVE   Ketones, ur NEGATIVE NEGATIVE mg/dL   Protein, ur 30 (A) NEGATIVE mg/dL   Nitrite NEGATIVE NEGATIVE   Leukocytes,Ua NEGATIVE NEGATIVE   RBC / HPF 0-5 0 - 5 RBC/hpf   WBC, UA 11-20 0 - 5 WBC/hpf   Bacteria, UA MANY (A) NONE SEEN   Squamous Epithelial / LPF 0-5 0 - 5   Mucus PRESENT     Dilation: 1 Effacement (%): 40 Cervical Position: Posterior Station: Ballotable Presentation: Vertex Exam by:: K. WEissRN   FHR: 140 bpm, Mod Var, No Decels, 15x15 Accels UC: irregular   A: 1. False labor   2. [redacted] weeks gestation of pregnancy      P:  RN to discharge home in stable condition with return precautions & fetal kick counts  Jorje Guild FNP 10:10 PM

## 2018-05-22 NOTE — Discharge Instructions (Signed)
Call your OB Clinic or go to [facility] if:  You begin to have strong, frequent contractions  Your water breaks.  Sometimes it is a big gush of fluid, sometimes it is just a trickle that keeps getting your panties wet or running down your legs  You have vaginal bleeding.  It is normal to have a small amount of spotting if your cervix was checked.   You don't feel your baby moving like normal.  If you don't, get you something to eat and drink and lay down and focus on feeling your baby move.  You should feel at least 10 movements in 2 hours.  If you don't, you should call the office or go to [facility].

## 2018-05-22 NOTE — Progress Notes (Signed)
Fetal presentation verified via U/S by N. Nugent, NP, fetus vertex.

## 2018-05-22 NOTE — Discharge Instructions (Signed)
Fetal Movement Counts Patient Name: ________________________________________________ Patient Due Date: ____________________ What is a fetal movement count?  A fetal movement count is the number of times that you feel your baby move during a certain amount of time. This may also be called a fetal kick count. A fetal movement count is recommended for every pregnant woman. You may be asked to start counting fetal movements as early as week 28 of your pregnancy. Pay attention to when your baby is most active. You may notice your baby's sleep and wake cycles. You may also notice things that make your baby move more. You should do a fetal movement count:  When your baby is normally most active.  At the same time each day. A good time to count movements is while you are resting, after having something to eat and drink. How do I count fetal movements? 1. Find a quiet, comfortable area. Sit, or lie down on your side. 2. Write down the date, the start time and stop time, and the number of movements that you felt between those two times. Take this information with you to your health care visits. 3. For 2 hours, count kicks, flutters, swishes, rolls, and jabs. You should feel at least 10 movements during 2 hours. 4. You may stop counting after you have felt 10 movements. 5. If you do not feel 10 movements in 2 hours, have something to eat and drink. Then, keep resting and counting for 1 hour. If you feel at least 4 movements during that hour, you may stop counting. Contact a health care provider if:  You feel fewer than 4 movements in 2 hours.  Your baby is not moving like he or she usually does. Date: ____________ Start time: ____________ Stop time: ____________ Movements: ____________ Date: ____________ Start time: ____________ Stop time: ____________ Movements: ____________ Date: ____________ Start time: ____________ Stop time: ____________ Movements: ____________ Date: ____________ Start time:  ____________ Stop time: ____________ Movements: ____________ Date: ____________ Start time: ____________ Stop time: ____________ Movements: ____________ Date: ____________ Start time: ____________ Stop time: ____________ Movements: ____________ Date: ____________ Start time: ____________ Stop time: ____________ Movements: ____________ Date: ____________ Start time: ____________ Stop time: ____________ Movements: ____________ Date: ____________ Start time: ____________ Stop time: ____________ Movements: ____________ This information is not intended to replace advice given to you by your health care provider. Make sure you discuss any questions you have with your health care provider. Document Released: 02/24/2006 Document Revised: 09/24/2015 Document Reviewed: 03/06/2015 Elsevier Interactive Patient Education  2019 [employer]. Signs and Symptoms of Labor Labor is your body's natural process of moving your baby, placenta, and umbilical cord out of your uterus. The process of labor usually starts when your baby is full-term, between 73 and 40 weeks of pregnancy. How will I know when I am close to going into labor? As your body prepares for labor and the birth of your baby, you may notice the following symptoms in the weeks and days before true labor starts:  Having a strong desire to get your home ready to receive your new baby. This is called nesting. Nesting may be a sign that labor is approaching, and it may occur several weeks before birth. Nesting may involve cleaning and organizing your home.  Passing a small amount of thick, bloody mucus out of your vagina (normal bloody show or losing your mucus plug). This may happen more than a week before labor begins, or it might occur right before labor begins as the opening of the cervix  starts to widen (dilate). For some women, the entire mucus plug passes at once. For others, smaller portions of the mucus plug may gradually pass over several  days.  Your baby moving (dropping) lower in your pelvis to get into position for birth (lightening). When this happens, you may feel more pressure on your bladder and pelvic bone and less pressure on your ribs. This may make it easier to breathe. It may also cause you to need to urinate more often and have problems with bowel movements.  Having "practice contractions" (Braxton Hicks contractions) that occur at irregular (unevenly spaced) intervals that are more than 10 minutes apart. This is also called false labor. False labor contractions are common after exercise or sexual activity, and they will stop if you change position, rest, or drink fluids. These contractions are usually mild and do not get stronger over time. They may feel like: ? A backache or back pain. ? Mild cramps, similar to menstrual cramps. ? Tightening or pressure in your abdomen. Other early symptoms that labor may be starting soon include:  Nausea or loss of appetite.  Diarrhea.  Having a sudden burst of energy, or feeling very tired.  Mood changes.  Having trouble sleeping. How will I know when labor has begun? Signs that true labor has begun may include:  Having contractions that come at regular (evenly spaced) intervals and increase in intensity. This may feel like more intense tightening or pressure in your abdomen that moves to your back. ? Contractions may also feel like rhythmic pain in your upper thighs or back that comes and goes at regular intervals. ? For first-time mothers, this change in intensity of contractions often occurs at a more gradual pace. ? Women who have given birth before may notice a more rapid progression of contraction changes.  Having a feeling of pressure in the vaginal area.  Your water breaking (rupture of membranes). This is when the sac of fluid that surrounds your baby breaks. When this happens, you will notice fluid leaking from your vagina. This may be clear or blood-tinged.  Labor usually starts within 24 hours of your water breaking, but it may take longer to begin. ? Some women notice this as a gush of fluid. ? Others notice that their underwear repeatedly becomes damp. Follow these instructions at home:   When labor starts, or if your water breaks, call your health care provider or nurse care line. Based on your situation, they will determine when you should go in for an exam.  When you are in early labor, you may be able to rest and manage symptoms at home. Some strategies to try at home include: ? Breathing and relaxation techniques. ? Taking a warm bath or shower. ? Listening to music. ? Using a heating pad on the lower back for pain. If you are directed to use heat:  Place a towel between your skin and the heat source.  Leave the heat on for 20-30 minutes.  Remove the heat if your skin turns bright red. This is especially important if you are unable to feel pain, heat, or cold. You may have a greater risk of getting burned. Get help right away if:  You have painful, regular contractions that are 5 minutes apart or less.  Labor starts before you are [redacted] weeks along in your pregnancy.  You have a fever.  You have a headache that does not go away.  You have bright red blood coming from your vagina.  You do not feel your baby moving.  You have a sudden onset of: ? Severe headache with vision problems. ? Nausea, vomiting, or diarrhea. ? Chest pain or shortness of breath. These symptoms may be an emergency. If your health care provider recommends that you go to the hospital or birth center where you plan to deliver, do not drive yourself. Have someone else drive you, or call emergency services (911 in the [place] Summary  Labor is your body's natural process of moving your baby, placenta, and umbilical cord out of your uterus.  The process of labor usually starts when your baby is full-term, between 31 and 40 weeks of pregnancy.  When labor  starts, or if your water breaks, call your health care provider or nurse care line. Based on your situation, they will determine when you should go in for an exam. This information is not intended to replace advice given to you by your health care provider. Make sure you discuss any questions you have with your health care provider. Document Released: 07/02/2016 Document Revised: 07/02/2016 Document Reviewed: 07/02/2016 Elsevier Interactive Patient Education  2019 [employer].

## 2018-05-22 NOTE — MAU Note (Signed)
I have communicated with Robyne Askew NP and reviewed vital signs:  Vitals:   05/22/18 2105 05/22/18 2148  BP: 124/84 106/68  Pulse: 97 87  Resp: 16   Temp:      Vaginal exam:  Dilation: 1 Effacement (%): 40 Cervical Position: Posterior Station: Ballotable Presentation: Vertex Exam by:: Iven Finn,   Also reviewed contraction pattern and that non-stress test is reactive.  It has been documented that patient is contracting irregularly with no cervical change since this morning not indicating active labor.  Patient denies any other complaints.  Based on this report provider has given order for discharge.  A discharge order and diagnosis entered by a provider.   Labor discharge instructions reviewed with patient.

## 2018-05-26 ENCOUNTER — Other Ambulatory Visit: Payer: Self-pay

## 2018-05-26 ENCOUNTER — Encounter (HOSPITAL_COMMUNITY): Payer: Self-pay | Admitting: *Deleted

## 2018-05-26 ENCOUNTER — Inpatient Hospital Stay (HOSPITAL_COMMUNITY): Admit: 2018-05-26 | Payer: Medicaid Other

## 2018-05-26 ENCOUNTER — Inpatient Hospital Stay (HOSPITAL_COMMUNITY)
Admission: AD | Admit: 2018-05-26 | Discharge: 2018-05-26 | Disposition: A | Discharge status: Home / Self Care | Payer: Medicaid Other | Attending: Obstetrics & Gynecology | Admitting: Obstetrics & Gynecology

## 2018-05-26 DIAGNOSIS — Z3480 Encounter for supervision of other normal pregnancy, unspecified trimester: Secondary | ICD-10-CM

## 2018-05-26 DIAGNOSIS — O471 False labor at or after 37 completed weeks of gestation: Secondary | ICD-10-CM | POA: Diagnosis not present

## 2018-05-26 DIAGNOSIS — O09299 Supervision of pregnancy with other poor reproductive or obstetric history, unspecified trimester: Secondary | ICD-10-CM

## 2018-05-26 DIAGNOSIS — O479 False labor, unspecified: Secondary | ICD-10-CM | POA: Diagnosis not present

## 2018-05-26 DIAGNOSIS — Z3009 Encounter for other general counseling and advice on contraception: Secondary | ICD-10-CM

## 2018-05-26 DIAGNOSIS — Z3A37 37 weeks gestation of pregnancy: Secondary | ICD-10-CM | POA: Diagnosis not present

## 2018-05-26 MED ORDER — BUTORPHANOL TARTRATE 1 MG/ML IJ SOLN
1.0000 mg | Freq: Once | INTRAMUSCULAR | Status: AC
  Administered 2018-05-26: 08:00:00 1 mg via INTRAMUSCULAR
  Filled 2018-05-26: qty 1

## 2018-05-26 MED ORDER — TRAMADOL HCL 50 MG PO TABS: 50.0000 mg | ORAL_TABLET | Freq: Four times a day (QID) | ORAL | 0 refills | Status: DC | PRN

## 2018-05-26 MED ORDER — PROMETHAZINE HCL 25 MG/ML IJ SOLN
12.5000 mg | Freq: Once | INTRAMUSCULAR | Status: AC
  Administered 2018-05-26: 12.5 mg via INTRAMUSCULAR
  Filled 2018-05-26: qty 1

## 2018-05-26 NOTE — MAU Note (Signed)
PT SAYS  HAS BEEN HAVING UC'S .   WAS SUPPOSE TO GO  FOR [facility] ON Monday BUT NO ELECTRICITY- RESC FOR  MON -20TH.   DENIES HSV AND  MRSA.   [facility] FOR   REPEAT  C/S - 5-3 .

## 2018-05-26 NOTE — MAU Provider Note (Signed)
History     CSN: [id number]  Arrival date and time: 05/26/18 1497   First Provider Initiated Contact with Patient 05/26/18 [phone]      Chief Complaint  Patient presents with  . Contractions   Ms. Routt presents to MAU for contractions She has a history of 2 vaginal deliveries and 1 cesarean section with her most recent delivery She reports that contractions have been ongoing for several days and have become more painful and frequent She is also overall uncomfortable with this pregnancy and having difficulty managing her daily tasks Has had several trips to MAU over the past 11 days for similar symptoms, but no cervical change Denies leaking of fluid, decreased fetal movement, vaginal discharge, bleeding, itching, burning or odor   Past Medical History:  Diagnosis Date  . Anemia   . Pre-eclampsia 2018    Past Surgical History:  Procedure Laterality Date  . CESAREAN SECTION      Family History  Problem Relation Age of Onset  . Hypertension Mother   . Diabetes Mother   . Hypercholesterolemia Father   . Hypertension Father   . Heart disease Maternal Grandmother   . Diabetes Maternal Grandmother     Social History   Tobacco Use  . Smoking status: Never Smoker  . Smokeless tobacco: Never Used  Substance Use Topics  . Alcohol use: Not Currently  . Drug use: Never    Allergies:  Allergies  Allergen Reactions  . Ciprofloxacin Anaphylaxis  . Zofran [Ondansetron Hcl] Anaphylaxis    Medications Prior to Admission  Medication Sig Dispense Refill Last Dose  . calcium carbonate (TUMS - DOSED IN MG ELEMENTAL CALCIUM) 500 MG chewable tablet Chew 1 tablet by mouth 4 (four) times daily as needed for indigestion or heartburn.   Past Week at Unknown time  . ferrous sulfate 325 (65 FE) MG tablet Take 1 tablet (325 mg total) by mouth daily. 30 tablet 0 05/25/2018 at Unknown time  . Prenatal Vit-Fe Fumarate-FA (PRENATAL MULTIVITAMIN) TABS tablet Take 1 tablet by mouth daily at  12 noon.   05/25/2018 at Unknown time  . cefadroxil (DURICEF) 500 MG capsule Take 1 capsule (500 mg total) by mouth 2 (two) times daily for 7 days. 14 capsule 0 05/22/2018 at Unknown time    Review of Systems  All other systems reviewed and are negative.  Physical Exam   Blood pressure 110/67, pulse 84, temperature 98.4 F (36.9 C), temperature source Oral, resp. rate 20, height 5\' 5"  (1.651 m), weight 115.9 kg, last menstrual period 09/11/2017.  Physical Exam  Constitutional: She is oriented to person, place, and time. She appears well-developed and well-nourished. No distress.  Appears comfortable, laughs and texts on phone  HENT:  Head: Normocephalic and atraumatic.  Eyes: Pupils are equal, round, and reactive to light. Conjunctivae are normal. Right eye exhibits no discharge. Left eye exhibits no discharge. No scleral icterus.  Cardiovascular: Normal rate and regular rhythm.  Respiratory: Effort normal. No respiratory distress.  GI: Soft.  gravid  Neurological: She is alert and oriented to person, place, and time.  Skin: She is not diaphoretic.  Psychiatric: She has a normal mood and affect. Her behavior is normal. Judgment and thought content normal.  NST: baseline 130s/mod variability/+ acels/no decels Uterine activity: regular contractions q3-62m  MAU Course  Procedures  MDM Regular contractions that patient reports are 7-8/10 pain intensity, but no cervical change Several visits to MAU at this point due to pain   Assessment and Plan  Braxton Hick's contraction - offered patient therapeutic rest with stadol and phenergan, which she accepted - discharged with tramadol - return precautions discussed - follow up as scheduled or PRN   Aura Camps 05/26/2018, 8:07 AM

## 2018-05-26 NOTE — Discharge Instructions (Signed)
Braxton Hicks Contractions Contractions of the uterus can occur throughout pregnancy, but they are not always a sign that you are in labor. You may have practice contractions called Braxton Hicks contractions. These false labor contractions are sometimes confused with true labor. What are Braxton Hicks contractions? Braxton Hicks contractions are tightening movements that occur in the muscles of the uterus before labor. Unlike true labor contractions, these contractions do not result in opening (dilation) and thinning of the cervix. Toward the end of pregnancy (32-34 weeks), Braxton Hicks contractions can happen more often and may become stronger. These contractions are sometimes difficult to tell apart from true labor because they can be very uncomfortable. You should not feel embarrassed if you go to the hospital with false labor. Sometimes, the only way to tell if you are in true labor is for your health care provider to look for changes in the cervix. The health care provider will do a physical exam and may monitor your contractions. If you are not in true labor, the exam should show that your cervix is not dilating and your water has not broken. If there are no other health problems associated with your pregnancy, it is completely safe for you to be sent home with false labor. You may continue to have Braxton Hicks contractions until you go into true labor. How to tell the difference between true labor and false labor True labor  Contractions last 30-70 seconds.  Contractions become very regular.  Discomfort is usually felt in the top of the uterus, and it spreads to the lower abdomen and low back.  Contractions do not go away with walking.  Contractions usually become more intense and increase in frequency.  The cervix dilates and gets thinner. False labor  Contractions are usually shorter and not as strong as true labor contractions.  Contractions are usually irregular.  Contractions  are often felt in the front of the lower abdomen and in the groin.  Contractions may go away when you walk around or change positions while lying down.  Contractions get weaker and are shorter-lasting as time goes on.  The cervix usually does not dilate or become thin. Follow these instructions at home:   Take over-the-counter and prescription medicines only as told by your health care provider.  Keep up with your usual exercises and follow other instructions from your health care provider.  Eat and drink lightly if you think you are going into labor.  If Braxton Hicks contractions are making you uncomfortable: ? Change your position from lying down or resting to walking, or change from walking to resting. ? Sit and rest in a tub of warm water. ? Drink enough fluid to keep your urine pale yellow. Dehydration may cause these contractions. ? Do slow and deep breathing several times an hour.  Keep all follow-up prenatal visits as told by your health care provider. This is important. Contact a health care provider if:  You have a fever.  You have continuous pain in your abdomen. Get help right away if:  Your contractions become stronger, more regular, and closer together.  You have fluid leaking or gushing from your vagina.  You pass blood-tinged mucus (bloody show).  You have bleeding from your vagina.  You have low back pain that you never had before.  You feel your baby's head pushing down and causing pelvic pressure.  Your baby is not moving inside you as much as it used to. Summary  Contractions that occur before labor are   called Braxton Hicks contractions, false labor, or practice contractions.  Braxton Hicks contractions are usually shorter, weaker, farther apart, and less regular than true labor contractions. True labor contractions usually become progressively stronger and regular, and they become more frequent.  Manage discomfort from Braxton Hicks contractions  by changing position, resting in a warm bath, drinking plenty of water, or practicing deep breathing. This information is not intended to replace advice given to you by your health care provider. Make sure you discuss any questions you have with your health care provider. Document Released: 06/10/2016 Document Revised: 11/09/2016 Document Reviewed: 06/10/2016 Elsevier Interactive Patient Education  2019 [employer]  

## 2018-05-29 ENCOUNTER — Ambulatory Visit (INDEPENDENT_AMBULATORY_CARE_PROVIDER_SITE_OTHER): Payer: Medicaid Other | Admitting: Obstetrics and Gynecology

## 2018-05-29 ENCOUNTER — Encounter: Payer: Self-pay | Admitting: Obstetrics and Gynecology

## 2018-05-29 ENCOUNTER — Other Ambulatory Visit: Payer: Self-pay

## 2018-05-29 ENCOUNTER — Encounter (HOSPITAL_COMMUNITY): Payer: Self-pay

## 2018-05-29 ENCOUNTER — Other Ambulatory Visit (HOSPITAL_COMMUNITY)
Admission: RE | Admit: 2018-05-29 | Discharge: 2018-05-29 | Disposition: A | Discharge status: Home / Self Care | Payer: Medicaid Other | Source: Ambulatory Visit | Attending: Obstetrics and Gynecology | Admitting: Obstetrics and Gynecology

## 2018-05-29 VITALS — BP 112/74 | HR 98 | Wt 254.9 lb

## 2018-05-29 DIAGNOSIS — O09293 Supervision of pregnancy with other poor reproductive or obstetric history, third trimester: Secondary | ICD-10-CM

## 2018-05-29 DIAGNOSIS — Z3480 Encounter for supervision of other normal pregnancy, unspecified trimester: Secondary | ICD-10-CM

## 2018-05-29 DIAGNOSIS — O09299 Supervision of pregnancy with other poor reproductive or obstetric history, unspecified trimester: Secondary | ICD-10-CM

## 2018-05-29 DIAGNOSIS — Z3A37 37 weeks gestation of pregnancy: Secondary | ICD-10-CM

## 2018-05-29 DIAGNOSIS — Z98891 History of uterine scar from previous surgery: Secondary | ICD-10-CM

## 2018-05-29 MED ORDER — BLOOD PRESSURE MONITORING KIT: 1.0000 | PACK | 0 refills | Status: DC

## 2018-05-29 NOTE — Patient Instructions (Signed)
[name]  05/29/2018   Your procedure is scheduled on:  06/11/2018  Arrive at 1000 at Entrance C on [address] at [facility]  and [address]. You are invited to use the FREE valet parking or use the Visitor's parking deck.  Pick up the phone at the desk and dial [phone].  Call this number if you have problems the morning of surgery: [phone]  Remember:   Do not eat food:(After Midnight) Desps de medianoche.  Do not drink clear liquids: (After Midnight) Desps de medianoche.  Take these medicines the morning of surgery with A SIP OF WATER:  none   Do not wear jewelry, make-up or nail polish.  Do not wear lotions, powders, or perfumes. Do not wear deodorant.  Do not shave 48 hours prior to surgery.  Do not bring valuables to the hospital.  [facility] is not   responsible for any belongings or valuables brought to the hospital.  Contacts, dentures or bridgework may not be worn into surgery.  Leave suitcase in the car. After surgery it may be brought to your room.  For patients admitted to the hospital, checkout time is 11:00 AM the day of              discharge.      Please read over the following fact sheets that you were given:     Preparing for Surgery

## 2018-05-29 NOTE — Progress Notes (Signed)
   PRENATAL VISIT NOTE  Subjective:  Jennifer Booth is a 33 y.o. [phone] at [redacted]w[redacted]d being seen today for ongoing prenatal care.  She is currently monitored for the following issues for this high-risk pregnancy and has Supervision of other normal pregnancy, antepartum; Late prenatal care affecting pregnancy in third trimester; History of preterm delivery; History of twin pregnancy in prior pregnancy; History of C-section; H/O pre-eclampsia in prior pregnancy, currently pregnant; and Unwanted fertility on their problem list.  Patient reports contractions since 4/17.  Contractions: Irregular. Vag. Bleeding: None.  Movement: Present. Denies leaking of fluid.   The following portions of the patient's history were reviewed and updated as appropriate: allergies, current medications, past family history, past medical history, past social history, past surgical history and problem list.   Objective:   Vitals:   05/29/18 1545  BP: 112/74  Pulse: 98  Weight: 254 lb 14.4 oz (115.6 kg)    Fetal Status: Fetal Heart Rate (bpm): 140   Movement: Present     General:  Alert, oriented and cooperative. Patient is in no acute distress.  Skin: Skin is warm and dry. No rash noted.   Cardiovascular: Normal heart rate noted  Respiratory: Normal respiratory effort, no problems with respiration noted  Abdomen: Soft, gravid, appropriate for gestational age.  Pain/Pressure: Present     Pelvic: Cervical exam performed        Extremities: Normal range of motion.     Mental Status: Normal mood and affect. Normal behavior. Normal judgment and thought content.   Assessment and Plan:  Pregnancy: [id number] at [redacted]w[redacted]d 1. Supervision of other normal pregnancy, antepartum Patient is doing well Cultures today Follow up growth ultrasound on 4/22 Patient understands that her next visit will be a telehealth visit- Rx for BP cuff provided  2. History of C-section Patient scheduled for repeat c-section with BTL on 5/3  3.  H/O pre-eclampsia in prior pregnancy, currently pregnant Normotensive today  Term labor symptoms and general obstetric precautions including but not limited to vaginal bleeding, contractions, leaking of fluid and fetal movement were reviewed in detail with the patient. Please refer to After Visit Summary for other counseling recommendations.   Return in about 1 week (around 06/05/2018) for [place] , [place].  Future Appointments  Date Time Provider [place]  05/29/2018  4:00 PM Ambriel Gorelick, MD [place] None  05/31/2018  8:00 AM [place] [facility]-US  05/31/2018  8:00 AM [facility]-MFC [place] 3 [facility]-MFCUS MFC-US  06/09/2018  9:15 AM [facility]-LD PAT 1 MC-INDC None    Mora Bellman, MD

## 2018-05-29 NOTE — Progress Notes (Signed)
Patient reports fetal movement and contractions that are about 5-7 minutes apart for the last hour.

## 2018-05-30 LAB — CERVICOVAGINAL ANCILLARY ONLY
Chlamydia: NEGATIVE
Neisseria Gonorrhea: NEGATIVE

## 2018-05-31 ENCOUNTER — Other Ambulatory Visit: Payer: Self-pay

## 2018-05-31 ENCOUNTER — Ambulatory Visit (HOSPITAL_COMMUNITY): Payer: Medicaid Other | Admitting: *Deleted

## 2018-05-31 ENCOUNTER — Inpatient Hospital Stay (HOSPITAL_COMMUNITY)
Admission: AD | Admit: 2018-05-31 | Discharge: 2018-06-03 | DRG: 807 | Disposition: A | Discharge status: Home / Self Care | Payer: Medicaid Other | Attending: Obstetrics and Gynecology | Admitting: Obstetrics and Gynecology

## 2018-05-31 ENCOUNTER — Encounter (HOSPITAL_COMMUNITY): Payer: Self-pay

## 2018-05-31 ENCOUNTER — Ambulatory Visit (HOSPITAL_COMMUNITY)
Admission: RE | Admit: 2018-05-31 | Discharge: 2018-05-31 | Disposition: A | Discharge status: Home / Self Care | Payer: Medicaid Other | Source: Ambulatory Visit | Attending: Obstetrics and Gynecology | Admitting: Obstetrics and Gynecology

## 2018-05-31 VITALS — BP 110/79 | HR 102 | Temp 98.9°F

## 2018-05-31 DIAGNOSIS — Z362 Encounter for other antenatal screening follow-up: Secondary | ICD-10-CM | POA: Diagnosis not present

## 2018-05-31 DIAGNOSIS — O0990 Supervision of high risk pregnancy, unspecified, unspecified trimester: Secondary | ICD-10-CM | POA: Insufficient documentation

## 2018-05-31 DIAGNOSIS — O09213 Supervision of pregnancy with history of pre-term labor, third trimester: Secondary | ICD-10-CM

## 2018-05-31 DIAGNOSIS — O4100X0 Oligohydramnios, unspecified trimester, not applicable or unspecified: Secondary | ICD-10-CM

## 2018-05-31 DIAGNOSIS — O9902 Anemia complicating childbirth: Secondary | ICD-10-CM | POA: Diagnosis present

## 2018-05-31 DIAGNOSIS — O1403 Mild to moderate pre-eclampsia, third trimester: Secondary | ICD-10-CM

## 2018-05-31 DIAGNOSIS — O34219 Maternal care for unspecified type scar from previous cesarean delivery: Secondary | ICD-10-CM | POA: Diagnosis not present

## 2018-05-31 DIAGNOSIS — Z3A37 37 weeks gestation of pregnancy: Secondary | ICD-10-CM | POA: Diagnosis not present

## 2018-05-31 DIAGNOSIS — O1404 Mild to moderate pre-eclampsia, complicating childbirth: Principal | ICD-10-CM | POA: Diagnosis present

## 2018-05-31 DIAGNOSIS — D649 Anemia, unspecified: Secondary | ICD-10-CM | POA: Diagnosis present

## 2018-05-31 DIAGNOSIS — O99214 Obesity complicating childbirth: Secondary | ICD-10-CM | POA: Diagnosis present

## 2018-05-31 DIAGNOSIS — O0933 Supervision of pregnancy with insufficient antenatal care, third trimester: Secondary | ICD-10-CM | POA: Diagnosis not present

## 2018-05-31 DIAGNOSIS — Z98891 History of uterine scar from previous surgery: Secondary | ICD-10-CM

## 2018-05-31 DIAGNOSIS — O09299 Supervision of pregnancy with other poor reproductive or obstetric history, unspecified trimester: Secondary | ICD-10-CM

## 2018-05-31 DIAGNOSIS — O479 False labor, unspecified: Secondary | ICD-10-CM

## 2018-05-31 DIAGNOSIS — O1400 Mild to moderate pre-eclampsia, unspecified trimester: Secondary | ICD-10-CM | POA: Diagnosis present

## 2018-05-31 DIAGNOSIS — O99019 Anemia complicating pregnancy, unspecified trimester: Secondary | ICD-10-CM | POA: Diagnosis present

## 2018-05-31 LAB — CBC
HCT: 29.7 % — ABNORMAL LOW (ref 36.0–46.0)
Hemoglobin: 9.9 g/dL — ABNORMAL LOW (ref 12.0–15.0)
MCH: 32.7 pg (ref 26.0–34.0)
MCHC: 33.3 g/dL (ref 30.0–36.0)
MCV: 98 fL (ref 80.0–100.0)
Platelets: 179 10*3/uL (ref 150–400)
RBC: 3.03 MIL/uL — ABNORMAL LOW (ref 3.87–5.11)
RDW: 15.1 % (ref 11.5–15.5)
WBC: 7 10*3/uL (ref 4.0–10.5)
nRBC: 0 % (ref 0.0–0.2)

## 2018-05-31 LAB — COMPREHENSIVE METABOLIC PANEL
ALT: 12 U/L (ref 0–44)
AST: 19 U/L (ref 15–41)
Albumin: 3 g/dL — ABNORMAL LOW (ref 3.5–5.0)
Alkaline Phosphatase: 96 U/L (ref 38–126)
Anion gap: 9 (ref 5–15)
BUN: 7 mg/dL (ref 6–20)
CO2: 22 mmol/L (ref 22–32)
Calcium: 9.3 mg/dL (ref 8.9–10.3)
Chloride: 103 mmol/L (ref 98–111)
Creatinine, Ser: 0.7 mg/dL (ref 0.44–1.00)
GFR calc Af Amer: >60 mL/min (ref >60)
GFR calc non Af Amer: >60 mL/min (ref >60)
Glucose, Bld: 98 mg/dL (ref 70–99)
Potassium: 3.8 mmol/L (ref 3.5–5.1)
Sodium: 134 mmol/L — ABNORMAL LOW (ref 135–145)
Total Bilirubin: 0.6 mg/dL (ref 0.3–1.2)
Total Protein: 6.3 g/dL — ABNORMAL LOW (ref 6.5–8.1)

## 2018-05-31 LAB — STREP GP B NAA: Strep Gp B NAA: NEGATIVE

## 2018-05-31 MED ORDER — BUTALBITAL-APAP-CAFFEINE 50-325-40 MG PO TABS
1.0000 | ORAL_TABLET | Freq: Once | ORAL | Status: AC
  Administered 2018-06-01: 1 via ORAL
  Filled 2018-05-31: qty 1

## 2018-05-31 NOTE — MAU Note (Signed)
Contractions for the past 2 hours-now every 5 minutes.  States she's been feeling dizzy in the past 2 hours also.  No SOB/difficulty breathing.  States she thinks she's well hydrated.  Last ate around 1930- chicken and rice.  Last drank at 2200.  + FM.  Some spotting when she wiped earlier.  No LOF.

## 2018-05-31 NOTE — MAU Provider Note (Signed)
Chief Complaint:  Contractions   First Provider Initiated Contact with Patient 05/31/18 2311      HPI: Jennifer Booth is a 33 y.o. [id number] at 12w3dho presents to maternity admissions reporting painful contractions.  Has had some dizziness and spotting. No headache.  . She reports good fetal movement, denies LOF, vaginal bleeding, vaginal itching/burning, urinary symptoms, h/a, dizziness, n/v, diarrhea, constipation or fever/chills.  She denies headache, visual changes or RUQ abdominal pain.  Hx of vaginal births followed by a C/S for twins / malpresentation.   Had been planning a Repeat C/S and BTL.   RN note: Contractions for the past 2 hours-now every 5 minutes.  States she's been feeling dizzy in the past 2 hours also.  No SOB/difficulty breathing.  States she thinks she's well hydrated.  Last ate around 1930- chicken and rice.  Last drank at 2200.  + FM.  Some spotting when she wiped earlier.  No LOF.    Past Medical History: Past Medical History:  Diagnosis Date  . Anemia   . Hx of pre-eclampsia in prior pregnancy, currently pregnant   . Pre-eclampsia 2018    Past obstetric history: OB History  Gravida Para Term Preterm AB Living  _0 0 4  SAB TAB Ectopic Multiple Live Births  0 0 0 1 4    # Outcome Date GA Lbr Len/2nd Weight Sex Delivery Anes PTL Lv  4 Current           3A Preterm 08/06/16 311w0d  CS-LTranv   LIV     Complications: Preeclampsia  3B Preterm 08/06/16 3228w0d CS-LTranv     2 Term 2016 37w14w0dVag-Spont   LIV     Complications: Preeclampsia, Group B streptococcal infection  1 Term 2007 39w021w0dag-Spont   LIV    Past Surgical History: Past Surgical History:  Procedure Laterality Date  . CESAREAN SECTION      Family History: Family History  Problem Relation Age of Onset  . Hypertension Mother   . Diabetes Mother   . Hypercholesterolemia Father   . Hypertension Father   . Heart disease Maternal Grandmother   . Diabetes Maternal  Grandmother     Social History: Social History   Tobacco Use  . Smoking status: Never Smoker  . Smokeless tobacco: Never Used  Substance Use Topics  . Alcohol use: Not Currently  . Drug use: Never    Allergies:  Allergies  Allergen Reactions  . Ciprofloxacin Anaphylaxis  . Zofran [Ondansetron Hcl] Anaphylaxis    Meds:  Medications Prior to Admission  Medication Sig Dispense Refill Last Dose  . Blood Pressure Monitoring KIT 1 Device by Does not apply route once a week. 1 kit 0   . calcium carbonate (TUMS - DOSED IN MG ELEMENTAL CALCIUM) 500 MG chewable tablet Chew 1 tablet by mouth 4 (four) times daily as needed for indigestion or heartburn.   Taking  . ferrous sulfate 325 (65 FE) MG tablet Take 1 tablet (325 mg total) by mouth daily. 30 tablet 0 Taking  . Prenatal Vit-Fe Fumarate-FA (PRENATAL MULTIVITAMIN) TABS tablet Take 1 tablet by mouth daily at 12 noon.   Taking    I have reviewed patient's Past Medical Hx, Surgical Hx, Family Hx, Social Hx, medications and allergies.   ROS:  Review of Systems  Constitutional: Negative for chills and fever.  Eyes: Negative for visual disturbance.  Respiratory: Negative for shortness of breath.  Cardiovascular: Negative for leg swelling.  Gastrointestinal: Positive for abdominal pain. Negative for constipation, diarrhea, nausea and vomiting.  Genitourinary: Positive for vaginal bleeding (spotting). Negative for vaginal discharge.  Neurological: Negative for headaches.   Other systems negative  Physical Exam   Patient Vitals for the past 24 hrs:  BP Temp Pulse Resp SpO2 Height Weight  05/31/18 2344 - - - - 99 % - -  05/31/18 2339 - - - - 100 % - -  05/31/18 2329 - - - - 100 % - -  05/31/18 2324 - - - - 100 % - -  05/31/18 2301 (!) 121/97 - (!) 103 - - - -  05/31/18 2251 123/81 - 93 - - - -  05/31/18 2221 126/74 98.8 F (37.1 C) 90 19 98 % - -  05/31/18 2218 - - - - - 5' 5" (1.651 m) 115.9 kg   Vitals:   06/01/18 0216  06/01/18 0242 06/01/18 0301 06/01/18 0318  BP: (!) 152/86 111/77 (!) 98/37 105/85  Pulse: 83 78 (!) 192 79  Resp:  18    Temp:  98.2 F (36.8 C)    TempSrc:  Oral    SpO2:  100%    Weight:      Height:        Constitutional: Well-developed, well-nourished female in no acute distress.  Cardiovascular: normal rate and rhythm Respiratory: normal effort, clear to auscultation bilaterally GI: Abd soft, non-tender, gravid appropriate for gestational age.   No rebound or guarding. MS: Extremities nontender, no edema, normal ROM Neurologic: Alert and oriented x 4.  GU: Neg CVAT.  PELVIC EXAM:  Dilation: 1.5 Effacement (%): Thick Cervical Position: Posterior Station: -3 Presentation: Vertex Exam by:: Gilmer Mor RN  Recheck of cervix Dilation: 3 Effacement (%): 60 Cervical Position: Posterior Station: -2 Presentation: (between -2 and -1) Exam by:: Gilmer Mor RN  FHT:  Baseline 140 , moderate variability, accelerations present, no decelerations Contractions: q 2-4 mins Irregular     Labs: --/--/O POS, O POS (03/15 2120) Results for orders placed or performed during the hospital encounter of 05/31/18 (from the past 24 hour(s))  Protein / creatinine ratio, urine     Status: Abnormal   Collection Time: 05/31/18 11:08 PM  Result Value Ref Range   Creatinine, Urine 180.18 mg/dL   Total Protein, Urine 61 mg/dL   Protein Creatinine Ratio 0.34 (H) 0.00 - 0.15 mg/mg[Cre]  CBC     Status: Abnormal   Collection Time: 05/31/18 11:12 PM  Result Value Ref Range   WBC 7.0 4.0 - 10.5 K/uL   RBC 3.03 (L) 3.87 - 5.11 MIL/uL   Hemoglobin 9.9 (L) 12.0 - 15.0 g/dL   HCT 29.7 (L) 36.0 - 46.0 %   MCV 98.0 80.0 - 100.0 fL   MCH 32.7 26.0 - 34.0 pg   MCHC 33.3 30.0 - 36.0 g/dL   RDW 15.1 11.5 - 15.5 %   Platelets 179 150 - 400 K/uL   nRBC 0.0 0.0 - 0.2 %  Comprehensive metabolic panel     Status: Abnormal   Collection Time: 05/31/18 11:12 PM  Result Value Ref Range   Sodium  134 (L) 135 - 145 mmol/L   Potassium 3.8 3.5 - 5.1 mmol/L   Chloride 103 98 - 111 mmol/L   CO2 22 22 - 32 mmol/L   Glucose, Bld 98 70 - 99 mg/dL   BUN 7 6 - 20 mg/dL   Creatinine, Ser 0.70 0.44 - 1.00 mg/dL  Calcium 9.3 8.9 - 10.3 mg/dL   Total Protein 6.3 (L) 6.5 - 8.1 g/dL   Albumin 3.0 (L) 3.5 - 5.0 g/dL   AST 19 15 - 41 U/L   ALT 12 0 - 44 U/L   Alkaline Phosphatase 96 38 - 126 U/L   Total Bilirubin 0.6 0.3 - 1.2 mg/dL   GFR calc non Af Amer >60 >60 mL/min   GFR calc Af Amer >60 >60 mL/min   Anion gap 9 5 - 15  Urinalysis, Routine w reflex microscopic     Status: Abnormal   Collection Time: 06/01/18 12:40 AM  Result Value Ref Range   Color, Urine YELLOW YELLOW   APPearance HAZY (A) CLEAR   Specific Gravity, Urine 1.025 1.005 - 1.030   pH 6.0 5.0 - 8.0   Glucose, UA NEGATIVE NEGATIVE mg/dL   Hgb urine dipstick NEGATIVE NEGATIVE   Bilirubin Urine NEGATIVE NEGATIVE   Ketones, ur NEGATIVE NEGATIVE mg/dL   Protein, ur 30 (A) NEGATIVE mg/dL   Nitrite NEGATIVE NEGATIVE   Leukocytes,Ua SMALL (A) NEGATIVE   RBC / HPF 0-5 0 - 5 RBC/hpf   WBC, UA 21-50 0 - 5 WBC/hpf   Bacteria, UA RARE (A) NONE SEEN   Squamous Epithelial / LPF 0-5 0 - 5   Mucus PRESENT   Type and screen [facility] [place]     Status: None (Preliminary result)   Collection Time: 06/01/18  3:29 AM  Result Value Ref Range   ABO/RH(D) PENDING    Antibody Screen PENDING    Sample Expiration      06/04/2018 Performed at [facility], [place] [address] [place], [place] [address]     Imaging:    MAU Course/MDM: I have ordered labs and reviewed results. Protein/Cr ratio is elevated.  Other labs normal  NST reviewed and is reactive Intermittent elevation in blood pressure.   Consult Dr Ilda Basset with presentation, exam findings and test results.   Recommended admission. He came and talked to her about mode of delivery.  He recommends TOLAC and she agrees to try.   Treatments in MAU included  Fioricet for headache which changed from a "2' to a "6" at 51hrs..    Assessment: Single intrauterine pregnancy at 66w4dPreeclampsia, mild Early active labor Previous Cesarean delivery  Plan: Admit to Labor and Delivery for TOLAC Routine orders MD to follow.  MHansel FeinsteinCNM, MSN Certified Nurse-Midwife 05/31/2018 11:46 PM

## 2018-06-01 ENCOUNTER — Inpatient Hospital Stay (HOSPITAL_COMMUNITY): Payer: Medicaid Other | Admitting: Anesthesiology

## 2018-06-01 ENCOUNTER — Other Ambulatory Visit: Payer: Self-pay

## 2018-06-01 ENCOUNTER — Encounter (HOSPITAL_COMMUNITY): Payer: Self-pay | Admitting: Anesthesiology

## 2018-06-01 ENCOUNTER — Encounter (HOSPITAL_COMMUNITY): Payer: Self-pay | Admitting: Internal Medicine

## 2018-06-01 DIAGNOSIS — O1400 Mild to moderate pre-eclampsia, unspecified trimester: Secondary | ICD-10-CM | POA: Diagnosis present

## 2018-06-01 DIAGNOSIS — O9902 Anemia complicating childbirth: Secondary | ICD-10-CM | POA: Diagnosis not present

## 2018-06-01 DIAGNOSIS — O34219 Maternal care for unspecified type scar from previous cesarean delivery: Secondary | ICD-10-CM | POA: Diagnosis not present

## 2018-06-01 DIAGNOSIS — Z3490 Encounter for supervision of normal pregnancy, unspecified, unspecified trimester: Secondary | ICD-10-CM | POA: Diagnosis not present

## 2018-06-01 DIAGNOSIS — O1404 Mild to moderate pre-eclampsia, complicating childbirth: Secondary | ICD-10-CM | POA: Diagnosis not present

## 2018-06-01 DIAGNOSIS — O99214 Obesity complicating childbirth: Secondary | ICD-10-CM | POA: Diagnosis present

## 2018-06-01 DIAGNOSIS — Z3A37 37 weeks gestation of pregnancy: Secondary | ICD-10-CM | POA: Diagnosis not present

## 2018-06-01 DIAGNOSIS — D649 Anemia, unspecified: Secondary | ICD-10-CM | POA: Diagnosis not present

## 2018-06-01 LAB — CBC
HCT: 28.7 % — ABNORMAL LOW (ref 36.0–46.0)
Hemoglobin: 9.2 g/dL — ABNORMAL LOW (ref 12.0–15.0)
MCH: 32.2 pg (ref 26.0–34.0)
MCHC: 32.1 g/dL (ref 30.0–36.0)
MCV: 100.3 fL — ABNORMAL HIGH (ref 80.0–100.0)
Platelets: 177 10*3/uL (ref 150–400)
RBC: 2.86 MIL/uL — ABNORMAL LOW (ref 3.87–5.11)
RDW: 15.3 % (ref 11.5–15.5)
WBC: 8.3 10*3/uL (ref 4.0–10.5)
nRBC: 0 % (ref 0.0–0.2)

## 2018-06-01 LAB — URINALYSIS, ROUTINE W REFLEX MICROSCOPIC
Bilirubin Urine: NEGATIVE
Glucose, UA: NEGATIVE mg/dL
Hgb urine dipstick: NEGATIVE
Ketones, ur: NEGATIVE mg/dL
Nitrite: NEGATIVE
Protein, ur: 30 mg/dL — AB
Specific Gravity, Urine: 1.025 (ref 1.005–1.030)
pH: 6 (ref 5.0–8.0)

## 2018-06-01 LAB — TYPE AND SCREEN
ABO/RH(D): O POS
Antibody Screen: NEGATIVE

## 2018-06-01 LAB — RPR: RPR Ser Ql: NONREACTIVE

## 2018-06-01 LAB — PROTEIN / CREATININE RATIO, URINE
Creatinine, Urine: 180.18 mg/dL
Protein Creatinine Ratio: 0.34 mg/mg{Cre} — ABNORMAL HIGH (ref 0.00–0.15)
Total Protein, Urine: 61 mg/dL

## 2018-06-01 MED ORDER — PROCHLORPERAZINE EDISYLATE 10 MG/2ML IJ SOLN
10.0000 mg | INTRAMUSCULAR | Status: DC | PRN
  Filled 2018-06-01: qty 2

## 2018-06-01 MED ORDER — LACTATED RINGERS IV SOLN
INTRAVENOUS | Status: DC
  Administered 2018-06-01 (×4): via INTRAVENOUS

## 2018-06-01 MED ORDER — LIDOCAINE HCL (PF) 1 % IJ SOLN: 30.0000 mL | INTRAMUSCULAR | Status: DC | PRN

## 2018-06-01 MED ORDER — ZOLPIDEM TARTRATE 5 MG PO TABS: 5.0000 mg | ORAL_TABLET | Freq: Every evening | ORAL | Status: DC | PRN

## 2018-06-01 MED ORDER — EPHEDRINE 5 MG/ML INJ
10.0000 mg | INTRAVENOUS | Status: AC | PRN
  Administered 2018-06-01 (×2): 10 mg via INTRAVENOUS

## 2018-06-01 MED ORDER — PRENATAL MULTIVITAMIN CH
1.0000 | ORAL_TABLET | Freq: Every day | ORAL | Status: DC
  Administered 2018-06-02: 12:00:00 1 via ORAL
  Filled 2018-06-01: qty 1

## 2018-06-01 MED ORDER — OXYTOCIN 40 UNITS IN NORMAL SALINE INFUSION - SIMPLE MED
2.5000 [IU]/h | INTRAVENOUS | Status: DC
  Filled 2018-06-01: qty 1000

## 2018-06-01 MED ORDER — SIMETHICONE 80 MG PO CHEW: 80.0000 mg | CHEWABLE_TABLET | ORAL | Status: DC | PRN

## 2018-06-01 MED ORDER — SOD CITRATE-CITRIC ACID 500-334 MG/5ML PO SOLN: 30.0000 mL | ORAL | Status: DC | PRN

## 2018-06-01 MED ORDER — FENTANYL CITRATE (PF) 100 MCG/2ML IJ SOLN
100.0000 ug | INTRAMUSCULAR | Status: DC | PRN
  Administered 2018-06-01 (×3): 100 ug via INTRAVENOUS
  Filled 2018-06-01 (×2): qty 2

## 2018-06-01 MED ORDER — OXYTOCIN BOLUS FROM INFUSION
500.0000 mL | Freq: Once | INTRAVENOUS | Status: AC
  Administered 2018-06-01: 500 mL via INTRAVENOUS

## 2018-06-01 MED ORDER — DIPHENHYDRAMINE HCL 50 MG/ML IJ SOLN
12.5000 mg | INTRAMUSCULAR | Status: DC | PRN
  Administered 2018-06-01 (×2): 12.5 mg via INTRAVENOUS
  Filled 2018-06-01: qty 1

## 2018-06-01 MED ORDER — COCONUT OIL OIL: 1.0000 "application " | TOPICAL_OIL | Status: DC | PRN

## 2018-06-01 MED ORDER — TERBUTALINE SULFATE 1 MG/ML IJ SOLN: 0.2500 mg | Freq: Once | INTRAMUSCULAR | Status: DC | PRN

## 2018-06-01 MED ORDER — TETANUS-DIPHTH-ACELL PERTUSSIS 5-2.5-18.5 LF-MCG/0.5 IM SUSP: 0.5000 mL | Freq: Once | INTRAMUSCULAR | Status: DC

## 2018-06-01 MED ORDER — PHENYLEPHRINE 40 MCG/ML (10ML) SYRINGE FOR IV PUSH (FOR BLOOD PRESSURE SUPPORT)
80.0000 ug | PREFILLED_SYRINGE | INTRAVENOUS | Status: AC | PRN
  Administered 2018-06-01 (×3): 80 ug via INTRAVENOUS

## 2018-06-01 MED ORDER — LIDOCAINE-EPINEPHRINE (PF) 2 %-1:200000 IJ SOLN
INTRAMUSCULAR | Status: DC | PRN
  Administered 2018-06-01: 3 mL via EPIDURAL
  Administered 2018-06-01: 2 mL via EPIDURAL

## 2018-06-01 MED ORDER — ACETAMINOPHEN 325 MG PO TABS: 650.0000 mg | ORAL_TABLET | ORAL | Status: DC | PRN

## 2018-06-01 MED ORDER — BENZOCAINE-MENTHOL 20-0.5 % EX AERO: 1.0000 "application " | INHALATION_SPRAY | CUTANEOUS | Status: DC | PRN

## 2018-06-01 MED ORDER — PHENYLEPHRINE 40 MCG/ML (10ML) SYRINGE FOR IV PUSH (FOR BLOOD PRESSURE SUPPORT)
80.0000 ug | PREFILLED_SYRINGE | INTRAVENOUS | Status: AC | PRN
  Administered 2018-06-01 (×3): 80 ug via INTRAVENOUS
  Filled 2018-06-01 (×2): qty 10

## 2018-06-01 MED ORDER — FENTANYL CITRATE (PF) 100 MCG/2ML IJ SOLN
50.0000 ug | INTRAMUSCULAR | Status: DC | PRN
  Filled 2018-06-01: qty 2

## 2018-06-01 MED ORDER — DIPHENHYDRAMINE HCL 25 MG PO CAPS: 25.0000 mg | ORAL_CAPSULE | Freq: Four times a day (QID) | ORAL | Status: DC | PRN

## 2018-06-01 MED ORDER — SODIUM CHLORIDE (PF) 0.9 % IJ SOLN
INTRAMUSCULAR | Status: DC | PRN
  Administered 2018-06-01: 12 mL/h via EPIDURAL

## 2018-06-01 MED ORDER — IBUPROFEN 600 MG PO TABS
600.0000 mg | ORAL_TABLET | Freq: Four times a day (QID) | ORAL | Status: DC
  Administered 2018-06-02 – 2018-06-03 (×7): 600 mg via ORAL
  Filled 2018-06-01 (×7): qty 1

## 2018-06-01 MED ORDER — SENNOSIDES-DOCUSATE SODIUM 8.6-50 MG PO TABS
2.0000 | ORAL_TABLET | ORAL | Status: DC
  Administered 2018-06-02 – 2018-06-03 (×2): 2 via ORAL
  Filled 2018-06-01 (×2): qty 2

## 2018-06-01 MED ORDER — OXYTOCIN 40 UNITS IN NORMAL SALINE INFUSION - SIMPLE MED
1.0000 m[IU]/min | INTRAVENOUS | Status: DC
  Administered 2018-06-01: 10:00:00 2 m[IU]/min via INTRAVENOUS

## 2018-06-01 MED ORDER — EPHEDRINE 5 MG/ML INJ
10.0000 mg | INTRAVENOUS | Status: DC | PRN
  Administered 2018-06-01: 13:00:00 10 mg via INTRAVENOUS
  Filled 2018-06-01: qty 10

## 2018-06-01 MED ORDER — WITCH HAZEL-GLYCERIN EX PADS: 1.0000 "application " | MEDICATED_PAD | CUTANEOUS | Status: DC | PRN

## 2018-06-01 MED ORDER — FENTANYL-BUPIVACAINE-NACL 0.5-0.125-0.9 MG/250ML-% EP SOLN
12.0000 mL/h | EPIDURAL | Status: DC | PRN
  Filled 2018-06-01: qty 250

## 2018-06-01 MED ORDER — LACTATED RINGERS IV SOLN
500.0000 mL | INTRAVENOUS | Status: DC | PRN
  Administered 2018-06-01: 03:00:00 1000 mL via INTRAVENOUS

## 2018-06-01 MED ORDER — DIBUCAINE (PERIANAL) 1 % EX OINT: 1.0000 "application " | TOPICAL_OINTMENT | CUTANEOUS | Status: DC | PRN

## 2018-06-01 MED ORDER — LACTATED RINGERS IV SOLN
500.0000 mL | Freq: Once | INTRAVENOUS | Status: AC
  Administered 2018-06-01: 08:00:00 500 mL via INTRAVENOUS

## 2018-06-01 NOTE — H&P (Signed)
Obstetrics Admission History & Physical  06/01/2018 - 2:19 AM Primary OBGYN: [facility]  Chief Complaint: regular UCs  History of Present Illness  33 y.o. [id number] @ [redacted]w[redacted]d, with the above CC. Pregnancy complicated by: h/o SVD x 2 and then c/s for breech of twin A at 32wks in June 2018, [facility]  Ms. Jennifer Booth states that she is having regular UCs, no VB, LOF or decreased FM. Had a HA yesterday but none currently.  Review of Systems: as noted in the History of Present Illness.  Patient Active Problem List   Diagnosis Date Noted  . H/O pre-eclampsia in prior pregnancy, currently pregnant 05/03/2018  . Unwanted fertility 05/03/2018  . History of C-section 04/24/2018  . Late prenatal care affecting pregnancy in third trimester 04/19/2018  . History of preterm delivery 04/19/2018  . History of twin pregnancy in prior pregnancy 04/19/2018  . Supervision of other normal pregnancy, antepartum 04/18/2018     PMHx:  Past Medical History:  Diagnosis Date  . Anemia   . Hx of pre-eclampsia in prior pregnancy, currently pregnant   . Pre-eclampsia 2018   PSHx:  Past Surgical History:  Procedure Laterality Date  . CESAREAN SECTION     Medications:  Medications Prior to Admission  Medication Sig Dispense Refill Last Dose  . Blood Pressure Monitoring KIT 1 Device by Does not apply route once a week. 1 kit 0   . calcium carbonate (TUMS - DOSED IN MG ELEMENTAL CALCIUM) 500 MG chewable tablet Chew 1 tablet by mouth 4 (four) times daily as needed for indigestion or heartburn.   Taking  . ferrous sulfate 325 (65 FE) MG tablet Take 1 tablet (325 mg total) by mouth daily. 30 tablet 0 Taking  . Prenatal Vit-Fe Fumarate-FA (PRENATAL MULTIVITAMIN) TABS tablet Take 1 tablet by mouth daily at 12 noon.   Taking     Allergies: is allergic to ciprofloxacin and zofran [ondansetron hcl]. OBHx:  OB History  Gravida Para Term Preterm AB Living  4 3 2 1  0 4  SAB TAB Ectopic  Multiple Live Births  0 0 0 1 4    # Outcome Date GA Lbr Len/2nd Weight Sex Delivery Anes PTL Lv  4 Current           3A Preterm 08/06/16 331w0d  CS-LTranv   LIV     Complications: Preeclampsia  3B Preterm 08/06/16 3228w0d CS-LTranv     2 Term 2016 37w6w0dVag-Spont   LIV     Complications: Preeclampsia, Group B streptococcal infection  1 Term 2007 39w086w0dag-Spont   LIV         FHx:  Family History  Problem Relation Age of Onset  . Hypertension Mother   . Diabetes Mother   . Hypercholesterolemia Father   . Hypertension Father   . Heart disease Maternal Grandmother   . Diabetes Maternal Grandmother    Soc Hx:  Social History   Socioeconomic History  . Marital status: Single    Spouse name: Not on file  . Number of children: Not on file  . Years of education: Not on file  . Highest education level: Not on file  Occupational History  . Not on file  Social Needs  . Financial resource strain: Not hard at all  . Food insecurity:    Worry: Never true    Inability: Never true  . Transportation needs:  Medical: No    Non-medical: Not on file  Tobacco Use  . Smoking status: Never Smoker  . Smokeless tobacco: Never Used  Substance and Sexual Activity  . Alcohol use: Not Currently  . Drug use: Never  . Sexual activity: Not Currently    Birth control/protection: None  Lifestyle  . Physical activity:    Days per week: Not on file    Minutes per session: Not on file  . Stress: Only a little  Relationships  . Social connections:    Talks on phone: Not on file    Gets together: Not on file    Attends religious service: Not on file    Active member of club or organization: Not on file    Attends meetings of clubs or organizations: Not on file    Relationship status: Not on file  . Intimate partner violence:    Fear of current or ex partner: No    Emotionally abused: No    Physically abused: No    Forced sexual activity: No  Other Topics Concern  . Not on  file  Social History Narrative  . Not on file    Objective    Current Vital Signs 24h Vital Sign Ranges  T 98.4 F (36.9 C) Temp  Avg: 98.7 F (37.1 C)  Min: 98.4 F (36.9 C)  Max: 98.9 F (37.2 C)  BP 111/70 BP  Min: 93/61  Max: 128/75  HR 80 Pulse  Avg: 94.4  Min: 73  Max: 133  RR 19 Resp  Avg: 19  Min: 19  Max: 19  SaO2 99 %   SpO2  Avg: 99 %  Min: 98 %  Max: 100 %       24 Hour I/O Current Shift I/O  Time Ins Outs No intake/output data recorded. No intake/output data recorded.   Patient Vitals for the past 6 hrs:  BP Temp Temp src Pulse Resp SpO2 Height Weight  06/01/18 0200 111/70 - - 80 - - - -  06/01/18 0155 100/64 - - (!) 133 - - - -  06/01/18 0130 128/75 - - 86 - - - -  06/01/18 0115 111/79 - - 84 - - - -  06/01/18 0059 93/61 - - 88 - - - -  06/01/18 0046 (!) 114/59 - - 95 - - - -  06/01/18 0032 - - - - - 99 % - -  06/01/18 0031 111/75 - - 88 - - - -  06/01/18 0029 - - - - - 99 % - -  06/01/18 0024 - - - - - 99 % - -  06/01/18 0019 - - - - - 99 % - -  06/01/18 0017 125/67 - - 91 - - - -  06/01/18 0014 - - - - - 99 % - -  06/01/18 0009 - - - - - 99 % - -  06/01/18 0008 - 98.4 F (36.9 C) Oral - - - - -  06/01/18 0005 - - - - - 99 % - -  06/01/18 0004 - - - - - 99 % - -  06/01/18 0000 121/79 - - 73 - - - -  05/31/18 2359 - - - - - 98 % - -  05/31/18 2354 - - - - - 98 % - -  05/31/18 2349 - - - - - 99 % - -  05/31/18 2344 109/72 - - (!) 116 - 99 % - -  05/31/18 2339 - - - - -  100 % - -  05/31/18 2329 - - - - - 100 % - -  05/31/18 2324 - - - - - 100 % - -  05/31/18 2301 (!) 121/97 - - (!) 103 - - - -  05/31/18 2251 123/81 - - 93 - - - -  05/31/18 2221 126/74 98.8 F (37.1 C) - 90 19 98 % - -  05/31/18 2218 - - - - - - 5' 5"  (1.651 m) 115.9 kg   EFM: 125 baseline, +accels, no decel, mod variability  Toco: q1-34m General: Well nourished, well developed female in no acute distress.  Skin:  Warm and dry.  Cardiovascular: S1, S2 normal, no murmur, rub or  gallop, regular rate and rhythm Respiratory:  Clear to auscultation bilateral. Normal respiratory effort Abdomen: gravid, nttp Neuro/Psych:  Normal mood and affect.   SVE: pt went from 1 to 3cm in triage per RN Leopolds/EFW: cephalic by leopolds.  4/22: efw 31% 21314HO AC 488% cephalic, AFI 175.7 Labs  PC ratio 300  Recent Labs  Lab 05/31/18 2312  WBC 7.0  HGB 9.9*  HCT 29.7*  PLT 179    Recent Labs  Lab 05/31/18 2312  NA 134*  K 3.8  CL 103  CO2 22  BUN 7  CREATININE 0.70  CALCIUM 9.3  PROT 6.3*  BILITOT 0.6  ALKPHOS 96  ALT 12  AST 19  GLUCOSE 98    Radiology See above  Perinatal info  O POS/ Rubella  Immune / Varicella Unknown/RPR pending/HIV negative/HepB Surf Ag negative/TDaP:UTD 04/2018 /pap ASCUS/HPV pos, 16/18/45 negative/   Assessment & Plan   33y.o. [id number]@ 367w4dith mild pre-eclampsia, likely active labor and pt doing well *Pregnancy: category I with accels *mild pre-eclampsia: no severe s/s currently. Continue to follow *likely active labor: pt was scheduled for c/s and desired c/s for BTL. Pt states she had c/s for breech of twin A and has two 33 y/o, a 4 4/o and a 1282/o. I told her that she is a great tolac candidate and r/b d/w her and that she has a very high chance of VBAC with better recovery especially given her home situation. Pt also has [place] Medicaid and I told her about outpatient laparoscopic BTL which can be done postpartum at a later time. Pt would like to tolac.  Recommend exp management for now and d/w her that if labor slows can d/w her re: arom and/or pitocin *GBS: neg *Analgesia: no current needs. Options d/w her and can have epidural at any time  ChDurene RomansMD Attending [facility]

## 2018-06-01 NOTE — Anesthesia Preprocedure Evaluation (Signed)
Anesthesia Evaluation  Patient identified by MRN, date of birth, ID band Patient awake    Reviewed: Allergy & Precautions, NPO status , Patient's Chart, lab work & pertinent test results  Airway Mallampati: III  TM Distance: >3 FB Neck ROM: Full    Dental no notable dental hx.    Pulmonary neg pulmonary ROS,    Pulmonary exam normal breath sounds clear to auscultation       Cardiovascular hypertension, negative cardio ROS Normal cardiovascular exam Rhythm:Regular Rate:Normal     Neuro/Psych negative neurological ROS  negative psych ROS   GI/Hepatic negative GI ROS, Neg liver ROS,   Endo/Other  Morbid obesity  Renal/GU negative Renal ROS  negative genitourinary   Musculoskeletal negative musculoskeletal ROS (+)   Abdominal   Peds  Hematology  (+) Blood dyscrasia, anemia ,   Anesthesia Other Findings   Reproductive/Obstetrics (+) Pregnancy                             Anesthesia Physical Anesthesia Plan  ASA: III  Anesthesia Plan: Epidural   Post-op Pain Management:    Induction:   PONV Risk Score and Plan: Treatment may vary due to age or medical condition  Airway Management Planned: Natural Airway  Additional Equipment:   Intra-op Plan:   Post-operative Plan:   Informed Consent: I have reviewed the patients History and Physical, chart, labs and discussed the procedure including the risks, benefits and alternatives for the proposed anesthesia with the patient or authorized representative who has indicated his/her understanding and acceptance.       Plan Discussed with: Anesthesiologist  Anesthesia Plan Comments: (Patient identified. Risks, benefits, options discussed with patient including but not limited to bleeding, infection, nerve damage, paralysis, failed block, incomplete pain control, headache, blood pressure changes, nausea, vomiting, reactions to medication,  itching, and post partum back pain. Confirmed with bedside nurse the patient's most recent platelet count. Confirmed with the patient that they are not taking any anticoagulation, have any bleeding history or any family history of bleeding disorders. Patient expressed understanding and wishes to proceed. All questions were answered. )        Anesthesia Quick Evaluation

## 2018-06-01 NOTE — Progress Notes (Signed)
Jennifer Booth is a 33 y.o. [id number] at [redacted]w[redacted]d   Subjective: Patient denies pain. Reports new onset perineal pressure  Objective: BP 108/66   Pulse (!) 105   Temp 98.4 F (36.9 C) (Oral)   Resp 18   Ht 5\' 5"  (1.651 m)   Wt 115.9 kg   LMP 09/11/2017   SpO2 97%   BMI 42.51 kg/m  No intake/output data recorded. Total I/O In: -  Out: 8889 [Urine:1475]  FHT:  FHR: 145 bpm, variability: moderate,  accelerations:  Present,  decelerations:  Absent UC:   irregular, every 3-6 minutes SVE:   Dilation: 5 Effacement (%): 70 Station: 0 Exam by:: Taijon Vink, cnm  Labs: Lab Results  Component Value Date   WBC 7.0 05/31/2018   HGB 9.9 (L) 05/31/2018   HCT 29.7 (L) 05/31/2018   MCV 98.0 05/31/2018   PLT 179 05/31/2018   Assessment / Plan: Patient continues to desire TOLAC Category I tracing At bedside to evaluate patient report of new onset perineal pressure Negligible cervical dilation, cervix more efface and very soft, fetal head now well applied AROM now, moderate amount clear fluid Plan IUPC with next check if no improvement in pace of dilation despite Pitocin titration  Darlina Rumpf, CNM 06/01/2018, 4:33 PM

## 2018-06-01 NOTE — Progress Notes (Signed)
Jennifer Booth is a 33 y.o. [id number] at [redacted]w[redacted]d admitted for SOL and mild Preeclampsia  Subjective: Denies pain, perineal pressure.   Objective: BP 125/84   Pulse 91   Temp 98.4 F (36.9 C) (Oral)   Resp 16   Ht 5\' 5"  (1.651 m)   Wt 115.9 kg   LMP 09/11/2017   SpO2 97%   BMI 42.51 kg/m  No intake/output data recorded. Total I/O In: -  Out: 850 [Urine:850]  FHT:  FHR: 130 bpm, variability: moderate,  accelerations:  Present,  decelerations:  Absent UC:   regular, every 3-5 minutes SVE:   Dilation: 5 Effacement (%): 60 Station: Ballotable Exam by:: [place], rn  Labs: Lab Results  Component Value Date   WBC 7.0 05/31/2018   HGB 9.9 (L) 05/31/2018   HCT 29.7 (L) 05/31/2018   MCV 98.0 05/31/2018   PLT 179 05/31/2018    Assessment / Plan: Patient continues to desire TOLAC Category I Normotensive, no severe signs or symptoms Pitocin infusing at 12 milliunits, continue to titrate PRN Anticipate AROM with next check Anticipate NSVD  Darlina Rumpf, CNM 06/01/2018, 2:20 PM

## 2018-06-01 NOTE — Anesthesia Procedure Notes (Signed)
Epidural Patient location during procedure: OB Start time: 06/01/2018 7:00 AM End time: 06/01/2018 7:15 AM  Staffing Anesthesiologist: Freddrick March, MD Performed: anesthesiologist   Preanesthetic Checklist Completed: patient identified, pre-op evaluation, timeout performed, IV checked, risks and benefits discussed and monitors and equipment checked  Epidural Patient position: sitting Prep: site prepped and draped and DuraPrep Patient monitoring: continuous pulse ox, blood pressure, heart rate and cardiac monitor Approach: midline Location: L3-L4 Injection technique: LOR air  Needle:  Needle type: Tuohy  Needle gauge: 17 G Needle length: 9 cm Needle insertion depth: 9 cm Catheter type: closed end flexible Catheter size: 19 Gauge Catheter at skin depth: 15 cm Test dose: negative  Assessment Sensory level: T8 Events: blood not aspirated, injection not painful, no injection resistance, negative IV test and no paresthesia  Additional Notes Patient identified. Risks/Benefits/Options discussed with patient including but not limited to bleeding, infection, nerve damage, paralysis, failed block, incomplete pain control, headache, blood pressure changes, nausea, vomiting, reactions to medication both or allergic, itching and postpartum back pain. Confirmed with bedside nurse the patient's most recent platelet count. Confirmed with patient that they are not currently taking any anticoagulation, have any bleeding history or any family history of bleeding disorders. Patient expressed understanding and wished to proceed. All questions were answered. Sterile technique was used throughout the entire procedure. Please see nursing notes for vital signs. Test dose was given through epidural catheter and negative prior to continuing to dose epidural or start infusion. Warning signs of high block given to the patient including shortness of breath, tingling/numbness in hands, complete motor block,  or any concerning symptoms with instructions to call for help. Patient was given instructions on fall risk and not to get out of bed. All questions and concerns addressed with instructions to call with any issues or inadequate analgesia.  Reason for block:procedure for pain

## 2018-06-01 NOTE — Progress Notes (Signed)
LABOR PROGRESS NOTE  Jennifer Booth is a 33 y.o. [id number] at [redacted]w[redacted]d  admitted for SOL, noted to have new onset mild Pre-E.   Subjective: Strip note.   Objective: BP 106/67   Pulse 92   Temp 98.2 F (36.8 C) (Oral)   Resp 16   Ht 5\' 5"  (1.651 m)   Wt 115.9 kg   LMP 09/11/2017   SpO2 98%   BMI 42.51 kg/m  or  Vitals:   06/01/18 0450 06/01/18 0456 06/01/18 0507 06/01/18 0615  BP:  116/77 (!) 118/98 106/67  Pulse:  85 70 92  Resp:  16    Temp: 98.2 F (36.8 C)     TempSrc: Oral     SpO2:      Weight:      Height:        Dilation: 5 Effacement (%): 60 Cervical Position: Posterior Station: -2 Presentation: Vertex Exam by:: Christy Sartorius, RN FHT: baseline rate 135, moderate varibility, +acel, no decel Toco: q1-3 min   Labs: Lab Results  Component Value Date   WBC 7.0 05/31/2018   HGB 9.9 (L) 05/31/2018   HCT 29.7 (L) 05/31/2018   MCV 98.0 05/31/2018   PLT 179 05/31/2018    Patient Active Problem List   Diagnosis Date Noted  . Mild pre-eclampsia 06/01/2018  . H/O pre-eclampsia in prior pregnancy, currently pregnant 05/03/2018  . Unwanted fertility 05/03/2018  . History of C-section 04/24/2018  . Late prenatal care affecting pregnancy in third trimester 04/19/2018  . History of preterm delivery 04/19/2018  . History of twin pregnancy in prior pregnancy 04/19/2018  . Supervision of other normal pregnancy, antepartum 04/18/2018    Assessment / Plan: 33 y.o. G4P2104 at [redacted]w[redacted]d here for SOL, new onset mild Pre-E.   Labor: TOLAC. Making cervical change on own. Expectant management.  Fetal Wellbeing:  Cat I  Pain Control:  Epidural being placed. Anesthesia at bedside.  Anticipated MOD:  NSVD  Phill Myron, D.O. OB Fellow  06/01/2018, 7:19 AM

## 2018-06-01 NOTE — Discharge Summary (Addendum)
Postpartum Discharge Summary     Patient Name: Jennifer Booth DOB: 08-Feb-1986 MRN: [id number]  Date of admission: 05/31/2018 Delivering Provider: Lajean Manes   Date of discharge: 06/03/2018  Admitting diagnosis: 33 WKS, CTX, LIGHT HEADED Intrauterine pregnancy: [redacted]w[redacted]d    Secondary diagnosis:  Active Problems:   Mild pre-eclampsia   VBAC, delivered   Anemia of pregnancy  Additional problems: Hx of C/S and preterm delivery      Discharge diagnosis: Term Pregnancy Delivered, VBAC and Preeclampsia (mild)                                                                                                Post partum procedures:none  Augmentation: AROM and Pitocin  Complications: None  Hospital course:  Induction of Labor With Vaginal Delivery   33y.o. yo [phone] admitted to the hospital 05/31/2018 for induction of labor.  Indication for induction: PEC.  Patient had an uncomplicated labor course as follows: Membrane Rupture Time/Date: 4:08 PM ,06/01/2018   Intrapartum Procedures: Episiotomy: None [1]                                         Lacerations:  1st degree [2]  Patient had delivery of a Viable infant.  Information for the patient's newborn:  McCherrish, Vitali0[[id number]]Delivery Method: VBAC, Spontaneous(Filed from Delivery Summary)   06/01/2018  Details of delivery can be found in separate delivery note.  Patient had a routine postpartum course. Patient is discharged home 06/03/18.  Magnesium Sulfate recieved: No BMZ received: No  Physical exam  Vitals:   06/02/18 0815 06/02/18 1556 06/02/18 2212 06/03/18 0531  BP: 121/80 (!) 158/96 123/81 130/90  Pulse: 80 74 82 92  Resp: 17 18 18 18   Temp: 97.6 F (36.4 C) 98.4 F (36.9 C) 98.4 F (36.9 C) 97.7 F (36.5 C)  TempSrc: Oral  Oral Oral  SpO2: 100% 100%    Weight:      Height:       General: alert, cooperative and no distress Lochia: appropriate Uterine Fundus: firm Incision: N/A DVT  Evaluation: No evidence of DVT seen on physical exam. Labs: Lab Results  Component Value Date   WBC 7.6 06/02/2018   HGB 8.3 (L) 06/02/2018   HCT 25.3 (L) 06/02/2018   MCV 99.2 06/02/2018   PLT 158 06/02/2018   CMP Latest Ref Rng & Units 05/31/2018  Glucose 70 - 99 mg/dL 98  BUN 6 - 20 mg/dL 7  Creatinine 0.44 - 1.00 mg/dL 0.70  Sodium 135 - 145 mmol/L 134(L)  Potassium 3.5 - 5.1 mmol/L 3.8  Chloride 98 - 111 mmol/L 103  CO2 22 - 32 mmol/L 22  Calcium 8.9 - 10.3 mg/dL 9.3  Total Protein 6.5 - 8.1 g/dL 6.3(L)  Total Bilirubin 0.3 - 1.2 mg/dL 0.6  Alkaline Phos 38 - 126 U/L 96  AST 15 - 41 U/L 19  ALT 0 - 44 U/L 12    Discharge instruction:  per After Visit Summary and "Baby and Me Booklet".  After visit meds:  Allergies as of 06/03/2018      Reactions   Ciprofloxacin Anaphylaxis   Zofran [ondansetron Hcl] Anaphylaxis      Medication List    TAKE these medications   Blood Pressure Monitoring Kit 1 Device by Does not apply route once a week.   calcium carbonate 500 MG chewable tablet Commonly known as:  TUMS - dosed in mg elemental calcium Chew 1 tablet by mouth 4 (four) times daily as needed for indigestion or heartburn.   ferrous sulfate 325 (65 FE) MG tablet Take 1 tablet (325 mg total) by mouth daily.   ibuprofen 600 MG tablet Commonly known as:  ADVIL Take 1 tablet (600 mg total) by mouth every 6 (six) hours.   prenatal multivitamin Tabs tablet Take 1 tablet by mouth daily at 12 noon.       Diet: routine diet  Activity: Advance as tolerated. Pelvic rest for 6 weeks.   Outpatient follow up   1 week televisit for BP check                                      :4 weeks  For postpartum visit Follow up Appt: Future Appointments  Date Time Provider Eleele  06/29/2018  2:00 PM Sloan Leiter, MD [facility] None   Follow up Visit: [place] Follow up in 1 week(s).   Specialty:  Obstetrics and  Gynecology Why:  BP check with televisit THEN on 06/29/2018 at 2:00 pm televisit with Dr Rosana Hoes for postpartum checkup  Contact information: [address] [place] [phone]           Please schedule this patient for Postpartum visit in: 4 weeks with the following provider: Any provider For C/S patients schedule nurse incision check in weeks 2 weeks: no High risk pregnancy complicated by: PEC and Hx of C/S Delivery mode:  VBAC Anticipated Birth Control:  Plans Interval BTL PP Procedures needed: BP check  Schedule Integrated BH visit: no   Newborn Data: Live born female  Birth Weight:   APGAR: 52, 9  Newborn Delivery   Birth date/time:  06/01/2018 20:45:00 Delivery type:  VBAC, Spontaneous     Baby Feeding: Breast Disposition:home with mother   06/03/2018 Hansel Feinstein, CNM

## 2018-06-01 NOTE — Progress Notes (Signed)
Jennifer Booth is a 33 y.o. [phone] at [redacted]w[redacted]d admitted for SOL as well as new onset Preeclampsia with mildly elevated BP on admission.  Subjective: S/p reevaluation by Anesthesia and feeling excited about the new level of her pain coverage. Denies pain, able to detect fetal movement.  Patient continues to desire TOLAC  Objective: BP (!) 92/35   Pulse 94   Temp 98.4 F (36.9 C) (Oral)   Resp 18   Ht 5\' 5"  (1.651 m)   Wt 115.9 kg   LMP 09/11/2017   SpO2 97%   BMI 42.51 kg/m  No intake/output data recorded. No intake/output data recorded.  FHT:  FHR: 135 bpm, variability: moderate,  accelerations:  Present,  decelerations:  Absent UC:   Occasional, palpate mild SVE:   Dilation: 4 Effacement (%): 50 Station: Ballotable Exam by:: [employer], rn  Labs: Lab Results  Component Value Date   WBC 7.0 05/31/2018   HGB 9.9 (L) 05/31/2018   HCT 29.7 (L) 05/31/2018   MCV 98.0 05/31/2018   PLT 179 05/31/2018    Assessment / Plan: Category I Epidural infusing Normotensive and asymptomatic Up to 15 minutes between contractions Start Pitocin 2 x 2 now Anticipate NSVD  Darlina Rumpf, CNM 06/01/2018, 10:05 AM

## 2018-06-02 DIAGNOSIS — O99019 Anemia complicating pregnancy, unspecified trimester: Secondary | ICD-10-CM | POA: Diagnosis present

## 2018-06-02 DIAGNOSIS — Z3490 Encounter for supervision of normal pregnancy, unspecified, unspecified trimester: Secondary | ICD-10-CM | POA: Diagnosis not present

## 2018-06-02 LAB — CBC
HCT: 25.3 % — ABNORMAL LOW (ref 36.0–46.0)
Hemoglobin: 8.3 g/dL — ABNORMAL LOW (ref 12.0–15.0)
MCH: 32.5 pg (ref 26.0–34.0)
MCHC: 32.8 g/dL (ref 30.0–36.0)
MCV: 99.2 fL (ref 80.0–100.0)
Platelets: 158 10*3/uL (ref 150–400)
RBC: 2.55 MIL/uL — ABNORMAL LOW (ref 3.87–5.11)
RDW: 15.1 % (ref 11.5–15.5)
WBC: 7.6 10*3/uL (ref 4.0–10.5)
nRBC: 0 % (ref 0.0–0.2)

## 2018-06-02 MED ORDER — POLYSACCHARIDE IRON COMPLEX 150 MG PO CAPS
150.0000 mg | ORAL_CAPSULE | Freq: Every day | ORAL | Status: DC
  Administered 2018-06-02 – 2018-06-03 (×2): 150 mg via ORAL
  Filled 2018-06-02 (×2): qty 1

## 2018-06-02 NOTE — Anesthesia Postprocedure Evaluation (Signed)
Anesthesia Post Note  Patient: Jennifer Booth  Procedure(s) Performed: AN AD HOC LABOR EPIDURAL     Patient location during evaluation: Mother Baby Anesthesia Type: Epidural Level of consciousness: awake and alert Pain management: pain level controlled Vital Signs Assessment: post-procedure vital signs reviewed and stable Respiratory status: spontaneous breathing, nonlabored ventilation and respiratory function stable Cardiovascular status: stable Postop Assessment: no headache, no backache, epidural receding, no apparent nausea or vomiting, patient able to bend at knees, able to ambulate and adequate PO intake Anesthetic complications: no    Last Vitals:  Vitals:   06/02/18 0025 06/02/18 0405  BP: 124/79 132/85  Pulse: 94 80  Resp:  17  Temp: 36.8 C 36.9 C  SpO2: 100% 100%    Last Pain:  Vitals:   06/02/18 0405  TempSrc: Oral  PainSc: 2    Pain Goal:                   Ceazia Harb Hristova

## 2018-06-02 NOTE — Lactation Note (Signed)
This note was copied from a baby's chart. Lactation Consultation Note Baby is 27 hrs old. Mom has 31 yr old that she Bf for 3 months, 31 yr old 71 months, 31 yr old 64 months and 31 yr old twins that she BF the boy for 1 yr. Mom has to stop BF the Jennifer d/t milk allergy. Mom states this baby is latching and feeding well. Gave information sheet on caring for a baby less than 6 lbs. Guidelines on supplementing and feeding limits discussed. Reviewed the importance of STS, I&O and pumping. Encouraged mom to use DEBP d/t less than 6 lb baby as well if supplemented can give colostrum. Encouraged hand expression after pumping. Mom is OK w/supplementing w/formula if unable to collect enough colostrum. Asked RN to set up DEBP. Encouraged mom to call for assistance or questions. Lactation brochure at bedside.  Patient Name: Jennifer Booth [id number] Date: 06/02/2018 Reason for consult: Initial assessment;Infant < 6lbs   Maternal Data Has patient been taught Hand Expression?: Yes Does the patient have breastfeeding experience prior to this delivery?: Yes  Feeding Feeding Type: Breast Fed  LATCH Score                   Interventions Interventions: Breast feeding basics reviewed  Lactation Tools Discussed/Used WIC Program: No   Consult Status Consult Status: Follow-up Date: 06/03/18 Follow-up type: In-patient    Theodoro Kalata 06/02/2018, 4:55 AM

## 2018-06-02 NOTE — Progress Notes (Addendum)
Post Partum Day 1 Subjective: no complaints  Objective: Blood pressure 121/80, pulse 80, temperature 97.6 F (36.4 C), temperature source Oral, resp. rate 17, height 5\' 5"  (1.651 m), weight 115.9 kg, last menstrual period 09/11/2017, SpO2 100 %, unknown if currently breastfeeding.  Physical Exam:  General: alert and cooperative Lochia: appropriate Uterine Fundus: firm DVT Evaluation: No evidence of DVT seen on physical exam.  Recent Labs    06/01/18 2115 06/02/18 1019  HGB 9.2* 8.3*  HCT 28.7* 25.3*    Assessment/Plan: Plan for discharge tomorrow  Pt wants BTL, knows can't be done at this time d/t COVID-19: pt declines depo prior to discharge.  Preeclampsia, delivered - BP stable Anemia - started on FE   LOS: 1 day   Jorje Guild 06/02/2018, 11:20 AM

## 2018-06-02 NOTE — Clinical Social Work Maternal (Signed)
CLINICAL SOCIAL WORK MATERNAL/CHILD NOTE  Patient Details  Name: Jennifer Booth MRN: [id number] Date of Birth: 1985-03-22  Date:  06/02/2018  Clinical Social Worker Initiating Note:  Ollen Barges Date/Time: Initiated:  06/02/18/0952     Child's Name:  Jennifer Booth   Biological Parents:  Mother, Father(Jennifer Booth and Jennifer Booth DOB: 11/28/1971)   Need for Interpreter:  None   Reason for Referral:  Late or No Prenatal Care    Address:  [place] [address]    Phone number:  [phone] (home)     Additional phone number:   Household Members/Support Persons (HM/SP):   Household Member/Support Person 1, Household Member/Support Person 2, Household Member/Support Person 3, Household Member/Support Person 4, Household Member/Support Person 5   HM/SP Name Relationship DOB or Age  HM/SP -24 Jennifer Booth Sister    HM/SP -2 Jennifer Booth Daughter 01/24/2006  HM/SP -3 Jennifer Booth Daughter 05/01/2014  HM/SP -4 Jennifer Booth Daughter 08/06/2016  HM/SP -5 Jennifer Booth Son 08/06/2016  HM/SP -6        HM/SP -7        HM/SP -8          Natural Supports (not living in the home):  Immediate Family   Professional Supports:     Employment: Animator   Type of Work: [employer] as an Mudlogger:  Forensic psychologist   Homebound arranged:    Museum/gallery curator Resources:  Medicaid   Other Resources:  Physicist, medical (Intends to apply for [facility])   Cultural/Religious Considerations Which May Impact Care:    Strengths:  Ability to meet basic needs , Home prepared for child , Pediatrician chosen   Psychotropic Medications:         Pediatrician:    ([place])  Pediatrician List:   [place] Other    Pediatrician Fax Number:    Risk Factors/Current Problems:      Cognitive State:  Able to Concentrate  , Alert    Mood/Affect:  Bright , Calm , Comfortable , Interested , Happy    CSW Assessment: CSW received consult due to MOB's late prenatal care at 54 weeks.  CSW met with MOB to offer support and complete assessment.    MOB sitting up in bed eating breakfast with infant asleep in basinet, when CSW entered the room. CSW introduced self and role and explained reason for consult to which MOB expressed understanding. MOB was pleasant and easy to engage throughout assessment. MOB appropriate and attentive to infant's needs during CSW visit. MOB reported that she currently lives with her sister and 4 other children. MOB's highest level of education completed was high school graduate and currently works full-time as an Holiday representative at [employer]. MOB confirmed she receives food stamps and is aware of process to get infant added on and informed CSW that she intends to apply for [facility]. CSW denied having any mental health history or PPD with any of her other children. MOB still receptive to PMADs education. CSW provided education regarding the baby blues period vs. perinatal mood disorders, discussed treatment and gave resources for mental health follow up if concerns arise.  CSW recommends self-evaluation during the postpartum time period using the New Mom Checklist from Postpartum Progress and encouraged MOB to contact a medical professional if symptoms are noted at any time.  MOB denied any current SI, HI or DV and reported having a good support system consisting of her sister, her mother, her father and her step-mother.  CSW inquired about MOB's reasoning for entering PNC late. Per MOB, she had thought that she miscarried and then when she found out she was pregnant she had to wait 2 weeks to get in for an appointment. CSW informed MOB of Hospital Drug Policy and explained UDS and CDS were still pending but that a CPS report would be made, if warranted. MOB expressed understanding and denied any  questions or concerns regarding policy.  MOB confirmed she had all essential items for infant once discharged. MOB reported infant would sleep in a basinet once home. CSW provided review of Sudden Infant Death Syndrome (SIDS) precautions and safe sleeping habits. MOB intends to take infant to [facility] for follow up appointments and denied any barriers.   MOB denied any further questions, concerns, or need for resources at this time.    CSW Plan/Description:  No Further Intervention Required/No Barriers to Discharge, Perinatal Mood and Anxiety Disorder (PMADs) Education, Sudden Infant Death Syndrome (SIDS) Education, CSW Will Continue to Monitor Umbilical Cord Tissue Drug Screen Results and Make Report if Warranted, Hospital Drug Screen Policy Information    Mackenzie  Irwin, LCSWA 06/02/2018, 10:47 AM  

## 2018-06-03 MED ORDER — IBUPROFEN 600 MG PO TABS: 600.0000 mg | ORAL_TABLET | Freq: Four times a day (QID) | ORAL | 0 refills | Status: DC

## 2018-06-03 NOTE — Lactation Note (Signed)
This note was copied from a baby's chart. Lactation Consultation Note  Patient Name: Jennifer Booth JJHER'D Date: 06/03/2018   I called into Mother's room asking if she'd like to see Lactation. She responded that she's already been seen & she's "pretty good with breastfeeding." This is her 5th breastfed child.   Matthias Hughs [facility] 06/03/2018, 8:45 AM

## 2018-06-03 NOTE — Discharge Instructions (Signed)
Laparoscopic Tubal Ligation, Care After Refer to this sheet in the next few weeks. These instructions provide you with information about caring for yourself after your procedure. Your health care provider may also give you more specific instructions. Your treatment has been planned according to current medical practices, but problems sometimes occur. Call your health care provider if you have any problems or questions after your procedure. What can I expect after the procedure? After the procedure, it is common to have:  A sore throat.  Discomfort in your shoulder.  Mild discomfort or cramping in your abdomen.  Gas pains.  Pain or soreness in the area where the surgical cut (incision) was made.  A bloated feeling.  Tiredness.  Nausea.  Vomiting. Follow these instructions at home: Medicines  Take over-the-counter and prescription medicines only as told by your health care provider.  Do not take aspirin because it can cause bleeding.  Do not drive or operate heavy machinery while taking prescription pain medicine. Activity  Rest for the rest of the day.  Return to your normal activities as told by your health care provider. Ask your health care provider what activities are safe for you. Incision care      Follow instructions from your health care provider about how to take care of your incision. Make sure you: ? Wash your hands with soap and water before you change your bandage (dressing). If soap and water are not available, use hand sanitizer. ? Change your dressing as told by your health care provider. ? Leave stitches (sutures) in place. They may need to stay in place for 2 weeks or longer.  Check your incision area every day for signs of infection. Check for: ? More redness, swelling, or pain. ? More fluid or blood. ? Warmth. ? Pus or a bad smell. Other Instructions  Do not take baths, swim, or use a hot tub until your health care provider approves. You may take  showers.  Keep all follow-up visits as told by your health care provider. This is important.  Have someone help you with your daily household tasks for the first few days. Contact a health care provider if:  You have more redness, swelling, or pain around your incision.  Your incision feels warm to the touch.  You have pus or a bad smell coming from your incision.  The edges of your incision break open after the sutures have been removed.  Your pain does not improve after 2-3 days.  You have a rash.  You repeatedly become dizzy or light-headed.  Your pain medicine is not helping.  You are constipated. Get help right away if:  You have a fever.  You faint.  You have increasing pain in your abdomen.  You have severe pain in one or both of your shoulders.  You have fluid or blood coming from your sutures or from your vagina.  You have shortness of breath or difficulty breathing.  You have chest pain or leg pain.  You have ongoing nausea, vomiting, or diarrhea. This information is not intended to replace advice given to you by your health care provider. Make sure you discuss any questions you have with your health care provider. Document Released: 08/14/2004 Document Revised: 09/21/2016 Document Reviewed: 01/05/2015 Elsevier Interactive Patient Education  2019 [employer]. Vaginal Delivery, Care After Refer to this sheet in the next few weeks. These instructions provide you with information about caring for yourself after vaginal delivery. Your health care provider may also give  you more specific instructions. Your treatment has been planned according to current medical practices, but problems sometimes occur. Call your health care provider if you have any problems or questions. What can I expect after the procedure? After vaginal delivery, it is common to have:  Some bleeding from your vagina.  Soreness in your abdomen, your vagina, and the area of skin between your  vaginal opening and your anus (perineum).  Pelvic cramps.  Fatigue. Follow these instructions at home: Medicines  Take over-the-counter and prescription medicines only as told by your health care provider.  If you were prescribed an antibiotic medicine, take it as told by your health care provider. Do not stop taking the antibiotic until it is finished. Driving   Do not drive or operate heavy machinery while taking prescription pain medicine.  Do not drive for 24 hours if you received a sedative. Lifestyle  Do not drink alcohol. This is especially important if you are breastfeeding or taking medicine to relieve pain.  Do not use tobacco products, including cigarettes, chewing tobacco, or e-cigarettes. If you need help quitting, ask your health care provider. Eating and drinking  Drink at least 8 eight-ounce glasses of water every day unless you are told not to by your health care provider. If you choose to breastfeed your baby, you may need to drink more water than this.  Eat high-fiber foods every day. These foods may help prevent or relieve constipation. High-fiber foods include: ? Whole grain cereals and breads. ? Brown rice. ? Beans. ? Fresh fruits and vegetables. Activity  Return to your normal activities as told by your health care provider. Ask your health care provider what activities are safe for you.  Rest as much as possible. Try to rest or take a nap when your baby is sleeping.  Do not lift anything that is heavier than your baby or 10 lb (4.5 kg) until your health care provider says that it is safe.  Talk with your health care provider about when you can engage in sexual activity. This may depend on your: ? Risk of infection. ? Rate of healing. ? Comfort and desire to engage in sexual activity. Vaginal Care  If you have an episiotomy or a vaginal tear, check the area every day for signs of infection. Check for: ? More redness, swelling, or pain. ? More  fluid or blood. ? Warmth. ? Pus or a bad smell.  Do not use tampons or douches until your health care provider says this is safe.  Watch for any blood clots that may pass from your vagina. These may look like clumps of dark red, brown, or black discharge. General instructions  Keep your perineum clean and dry as told by your health care provider.  Wear loose, comfortable clothing.  Wipe from front to back when you use the toilet.  Ask your health care provider if you can shower or take a bath. If you had an episiotomy or a perineal tear during labor and delivery, your health care provider may tell you not to take baths for a certain length of time.  Wear a bra that supports your breasts and fits you well.  If possible, have someone help you with household activities and help care for your baby for at least a few days after you leave the hospital.  Keep all follow-up visits for you and your baby as told by your health care provider. This is important. Contact a health care provider if:  You have: ?  Vaginal discharge that has a bad smell. ? Difficulty urinating. ? Pain when urinating. ? A sudden increase or decrease in the frequency of your bowel movements. ? More redness, swelling, or pain around your episiotomy or vaginal tear. ? More fluid or blood coming from your episiotomy or vaginal tear. ? Pus or a bad smell coming from your episiotomy or vaginal tear. ? A fever. ? A rash. ? Little or no interest in activities you used to enjoy. ? Questions about caring for yourself or your baby.  Your episiotomy or vaginal tear feels warm to the touch.  Your episiotomy or vaginal tear is separating or does not appear to be healing.  Your breasts are painful, hard, or turn red.  You feel unusually sad or worried.  You feel nauseous or you vomit.  You pass large blood clots from your vagina. If you pass a blood clot from your vagina, save it to show to your health care provider. Do  not flush blood clots down the toilet without having your health care provider look at them.  You urinate more than usual.  You are dizzy or light-headed.  You have not breastfed at all and you have not had a menstrual period for 12 weeks after delivery.  You have stopped breastfeeding and you have not had a menstrual period for 12 weeks after you stopped breastfeeding. Get help right away if:  You have: ? Pain that does not go away or does not get better with medicine. ? Chest pain. ? Difficulty breathing. ? Blurred vision or spots in your vision. ? Thoughts about hurting yourself or your baby.  You develop pain in your abdomen or in one of your legs.  You develop a severe headache.  You faint.  You bleed from your vagina so much that you fill two sanitary pads in one hour. This information is not intended to replace advice given to you by your health care provider. Make sure you discuss any questions you have with your health care provider. Document Released: 01/23/2000 Document Revised: 07/09/2015 Document Reviewed: 02/09/2015 Elsevier Interactive Patient Education  2019 [employer].

## 2018-06-05 ENCOUNTER — Encounter: Payer: Medicaid Other | Admitting: Obstetrics & Gynecology

## 2018-06-09 ENCOUNTER — Encounter (HOSPITAL_COMMUNITY)
Admission: RE | Admit: 2018-06-09 | Discharge: 2018-06-09 | Disposition: A | Discharge status: Home / Self Care | Payer: Medicaid Other | Source: Ambulatory Visit

## 2018-06-09 HISTORY — DX: Supervision of pregnancy with other poor reproductive or obstetric history, unspecified trimester: O09.299

## 2018-06-11 ENCOUNTER — Inpatient Hospital Stay (HOSPITAL_COMMUNITY)
Admission: RE | Admit: 2018-06-11 | Payer: Medicaid Other | Source: Home / Self Care | Admitting: Obstetrics and Gynecology

## 2018-06-11 SURGERY — Surgical Case
Anesthesia: Regional | Laterality: Bilateral

## 2018-06-13 ENCOUNTER — Telehealth: Payer: Self-pay | Admitting: *Deleted

## 2018-06-13 NOTE — Telephone Encounter (Signed)
Pt called to office asking for letter for work.  Return call to pt.  She states she is wanting to return to work, she works from home. Pt states she delivered on 4/23 and doesn't have PP visit until 5/21 and wants to return before then. Pt states she was working from home prior to delivery.  Pt made aware message to be sent to provider to determine working status.  Please advise.

## 2018-06-16 ENCOUNTER — Encounter: Payer: Self-pay | Admitting: *Deleted

## 2018-06-16 NOTE — Telephone Encounter (Signed)
LM making pt aware of return to work, asked to call office for details.

## 2018-06-29 ENCOUNTER — Ambulatory Visit: Payer: Medicaid Other | Admitting: Obstetrics and Gynecology

## 2018-07-13 ENCOUNTER — Encounter: Payer: Self-pay | Admitting: Obstetrics and Gynecology

## 2018-07-13 ENCOUNTER — Ambulatory Visit (INDEPENDENT_AMBULATORY_CARE_PROVIDER_SITE_OTHER): Payer: Medicaid Other | Admitting: Obstetrics and Gynecology

## 2018-07-13 DIAGNOSIS — O34219 Maternal care for unspecified type scar from previous cesarean delivery: Secondary | ICD-10-CM

## 2018-07-13 DIAGNOSIS — Z3009 Encounter for other general counseling and advice on contraception: Secondary | ICD-10-CM

## 2018-07-13 DIAGNOSIS — Z1389 Encounter for screening for other disorder: Secondary | ICD-10-CM

## 2018-07-13 DIAGNOSIS — R87810 Cervical high risk human papillomavirus (HPV) DNA test positive: Secondary | ICD-10-CM | POA: Insufficient documentation

## 2018-07-13 DIAGNOSIS — O1400 Mild to moderate pre-eclampsia, unspecified trimester: Secondary | ICD-10-CM

## 2018-07-13 DIAGNOSIS — Z98891 History of uterine scar from previous surgery: Secondary | ICD-10-CM

## 2018-07-13 DIAGNOSIS — R87610 Atypical squamous cells of undetermined significance on cytologic smear of cervix (ASC-US): Secondary | ICD-10-CM | POA: Insufficient documentation

## 2018-07-13 MED ORDER — NIFEDIPINE ER OSMOTIC RELEASE 30 MG PO TB24: 30.0000 mg | ORAL_TABLET | Freq: Every day | ORAL | 2 refills | Status: DC

## 2018-07-13 NOTE — Progress Notes (Signed)
TELEHEALTH POSTPARTUM VIRTUAL VIDEO VISIT ENCOUNTER NOTE  Provider location: [facility] at [place]   I connected with Jennifer Booth on 07/13/18 at  1:30 PM EDT by WebEx Video Encounter at home and verified that I am speaking with the correct person using two identifiers.    I discussed the limitations, risks, security and privacy concerns of performing an evaluation and management service by telephone and the availability of in person appointments. I also discussed with the patient that there may be a patient responsible charge related to this service. The patient expressed understanding and agreed to proceed.  Appointment Date: 07/13/2018  OBGYN Clinic: [facility]  Chief Complaint: Postpartum Visit  History of Present Illness: Jennifer Booth is a 33 y.o. African-American [id number] being evaluated for postpartum followup.    She is s/p vaginal birth after c-section on 06/01/18 at 31 weeks; she was discharged to home on PPD#2. Pregnancy complicated by mild preeclampsia. Baby is doing well.  Complains of concerned with BP, has been running 140's-150's/80's-90's.  Vaginal bleeding or discharge: No  Intercourse: No  Contraception: abstinence, plans for BTL Mode of feeding infant: Breast PP depression s/s: No , score 0.  Any bowel or bladder issues: No  Pap smear: ASCUS with POSITIVE high risk HPV (date: 04/19/18)  Review of Systems: Positive for n/a. Her 12 point review of systems is negative or as noted in the History of Present Illness.  Patient Active Problem List   Diagnosis Date Noted  . ASCUS with positive high risk HPV cervical 07/13/2018  . Anemia of pregnancy 06/02/2018  . Mild pre-eclampsia 06/01/2018  . VBAC, delivered 06/01/2018  . H/O pre-eclampsia in prior pregnancy, currently pregnant 05/03/2018  . Unwanted fertility 05/03/2018  . History of C-section 04/24/2018  . Late prenatal care affecting pregnancy in third trimester 04/19/2018  . History of  preterm delivery 04/19/2018  . History of twin pregnancy in prior pregnancy 04/19/2018  . Supervision of other normal pregnancy, antepartum 04/18/2018    Medications Jennifer Booth had no medications administered during this visit. Current Outpatient Medications  Medication Sig Dispense Refill  . Prenatal Vit-Fe Fumarate-FA (PRENATAL MULTIVITAMIN) TABS tablet Take 1 tablet by mouth daily at 12 noon.    . Blood Pressure Monitoring KIT 1 Device by Does not apply route once a week. 1 kit 0  . calcium carbonate (TUMS - DOSED IN MG ELEMENTAL CALCIUM) 500 MG chewable tablet Chew 1 tablet by mouth 4 (four) times daily as needed for indigestion or heartburn.    . ferrous sulfate 325 (65 FE) MG tablet Take 1 tablet (325 mg total) by mouth daily. 30 tablet 0  . ibuprofen (ADVIL) 600 MG tablet Take 1 tablet (600 mg total) by mouth every 6 (six) hours. 30 tablet 0  . NIFEdipine (PROCARDIA-XL/NIFEDICAL-XL) 30 MG 24 hr tablet Take 1 tablet (30 mg total) by mouth daily. 30 tablet 2   No current facility-administered medications for this visit.     Allergies Ciprofloxacin and Zofran [ondansetron hcl]  Physical Exam:  LMP 09/11/2017   General:  Alert, oriented and cooperative. Patient is in no acute distress.  Mental Status: Normal mood and affect. Normal behavior. Normal judgment and thought content.   Respiratory: Normal respiratory effort noted, no problems with respiration noted  Rest of physical exam deferred due to type of encounter  PP Depression Screening:   Edinburgh Postnatal Depression Scale - 07/13/18 1346      Edinburgh Postnatal Depression Scale:  In the Past 7  Days   I have been able to laugh and see the funny side of things.  0    I have looked forward with enjoyment to things.  0    I have blamed myself unnecessarily when things went wrong.  0    I have been anxious or worried for no good reason.  0    I have felt scared or panicky for no good reason.  0    Things have been  getting on top of me.  0    I have been so unhappy that I have had difficulty sleeping.  0    I have felt sad or miserable.  0    I have been so unhappy that I have been crying.  0    The thought of harming myself has occurred to me.  0    Edinburgh Postnatal Depression Scale Total  0       Assessment:Patient is a 33 y.o. [id number] who is 5 weeks postpartum from a spontaneous VBAC.  She is doing well.   Plan:  1. Postpartum state Doing well  2. ASCUS with positive high risk HPV cervical - Needs colposcopy, reviewed path and implications with patient, importance of colpo  3. Unwanted fertility - For interval BTL Patient counseled regarding bilateral tubal ligation. Reviewed that this is a permanent procedure and that she will not be able to have children after it is done. Reviewed risks of bilateral tubal ligation including infection, hemorrhage, damage to surrounding tissue and organs, risk of regret. Reviewed that bilateral tubal ligation is not 100% effective and she should take a pregnancy test if she believes for any reason she may be pregnant. Reviewed slightly increased risk of ectopic pregnancy and need to seek care if she becomes pregnant. She understands this is an elective procedure and again affirms her desire. Will sign consent day of surgery, also consents to blood transfusion if necessary. - understands she will have COVID-19  4. History of C-section  5. VBAC, delivered  6. Mild pre-eclampsia, antepartum Has been running 140s/90s consistently since delivery - will start procardia 30 mg XL - reviewed may need to be on long term   RTC 4 weeks for post op and colposcopy  I discussed the assessment and treatment plan with the patient. The patient was provided an opportunity to ask questions and all were answered. The patient agreed with the plan and demonstrated an understanding of the instructions.   The patient was advised to call back or seek an in-person  evaluation/go to the ED for any concerning postpartum symptoms.  I provided 25 minutes of face-to-face time during this encounter.   Jennifer Leiter, MD [facility] [place] [place]

## 2018-07-25 ENCOUNTER — Encounter (HOSPITAL_BASED_OUTPATIENT_CLINIC_OR_DEPARTMENT_OTHER): Payer: Self-pay | Admitting: *Deleted

## 2018-07-25 ENCOUNTER — Other Ambulatory Visit: Payer: Self-pay

## 2018-07-29 ENCOUNTER — Other Ambulatory Visit (HOSPITAL_COMMUNITY)
Admission: RE | Admit: 2018-07-29 | Discharge: 2018-07-29 | Disposition: A | Discharge status: Home / Self Care | Payer: Medicaid Other | Source: Ambulatory Visit | Attending: Obstetrics and Gynecology | Admitting: Obstetrics and Gynecology

## 2018-07-29 DIAGNOSIS — Z1159 Encounter for screening for other viral diseases: Secondary | ICD-10-CM | POA: Insufficient documentation

## 2018-07-29 LAB — SARS CORONAVIRUS 2 (TAT 6-24 HRS): SARS Coronavirus 2: NEGATIVE

## 2018-08-02 ENCOUNTER — Ambulatory Visit (HOSPITAL_BASED_OUTPATIENT_CLINIC_OR_DEPARTMENT_OTHER): Payer: Medicaid Other | Admitting: Anesthesiology

## 2018-08-02 ENCOUNTER — Encounter (HOSPITAL_BASED_OUTPATIENT_CLINIC_OR_DEPARTMENT_OTHER): Payer: Self-pay | Admitting: Anesthesiology

## 2018-08-02 ENCOUNTER — Encounter (HOSPITAL_BASED_OUTPATIENT_CLINIC_OR_DEPARTMENT_OTHER)
Admission: RE | Disposition: A | Discharge status: Home / Self Care | Payer: Self-pay | Source: Home / Self Care | Attending: Obstetrics and Gynecology

## 2018-08-02 ENCOUNTER — Ambulatory Visit (HOSPITAL_BASED_OUTPATIENT_CLINIC_OR_DEPARTMENT_OTHER)
Admission: RE | Admit: 2018-08-02 | Discharge: 2018-08-02 | Disposition: A | Discharge status: Home / Self Care | Payer: Medicaid Other | Attending: Obstetrics and Gynecology | Admitting: Obstetrics and Gynecology

## 2018-08-02 DIAGNOSIS — K660 Peritoneal adhesions (postprocedural) (postinfection): Secondary | ICD-10-CM | POA: Insufficient documentation

## 2018-08-02 DIAGNOSIS — Z6841 Body Mass Index (BMI) 40.0 and over, adult: Secondary | ICD-10-CM | POA: Insufficient documentation

## 2018-08-02 DIAGNOSIS — I10 Essential (primary) hypertension: Secondary | ICD-10-CM | POA: Insufficient documentation

## 2018-08-02 DIAGNOSIS — Z302 Encounter for sterilization: Secondary | ICD-10-CM | POA: Insufficient documentation

## 2018-08-02 DIAGNOSIS — D649 Anemia, unspecified: Secondary | ICD-10-CM | POA: Insufficient documentation

## 2018-08-02 HISTORY — PX: LAPAROSCOPIC TUBAL LIGATION: SHX1937

## 2018-08-02 LAB — CBC
HCT: 34.6 % — ABNORMAL LOW (ref 36.0–46.0)
Hemoglobin: 11.3 g/dL — ABNORMAL LOW (ref 12.0–15.0)
MCH: 31.4 pg (ref 26.0–34.0)
MCHC: 32.7 g/dL (ref 30.0–36.0)
MCV: 96.1 fL (ref 80.0–100.0)
Platelets: 283 10*3/uL (ref 150–400)
RBC: 3.6 MIL/uL — ABNORMAL LOW (ref 3.87–5.11)
RDW: 13.2 % (ref 11.5–15.5)
WBC: 4.4 10*3/uL (ref 4.0–10.5)
nRBC: 0 % (ref 0.0–0.2)

## 2018-08-02 LAB — BASIC METABOLIC PANEL
Anion gap: 7 (ref 5–15)
BUN: 12 mg/dL (ref 6–20)
CO2: 25 mmol/L (ref 22–32)
Calcium: 8.7 mg/dL — ABNORMAL LOW (ref 8.9–10.3)
Chloride: 107 mmol/L (ref 98–111)
Creatinine, Ser: 0.95 mg/dL (ref 0.44–1.00)
GFR calc Af Amer: >60 mL/min (ref >60)
GFR calc non Af Amer: >60 mL/min (ref >60)
Glucose, Bld: 95 mg/dL (ref 70–99)
Potassium: 4.2 mmol/L (ref 3.5–5.1)
Sodium: 139 mmol/L (ref 135–145)

## 2018-08-02 LAB — HCG, QUANTITATIVE, PREGNANCY: hCG, Beta Chain, Quant, S: 1 m[IU]/mL (ref <5)

## 2018-08-02 SURGERY — LIGATION, FALLOPIAN TUBE, LAPAROSCOPIC
Anesthesia: General | Site: Abdomen | Laterality: Bilateral

## 2018-08-02 MED ORDER — FENTANYL CITRATE (PF) 100 MCG/2ML IJ SOLN
INTRAMUSCULAR | Status: AC
  Filled 2018-08-02: qty 2

## 2018-08-02 MED ORDER — LIDOCAINE 2% (20 MG/ML) 5 ML SYRINGE
INTRAMUSCULAR | Status: AC
  Filled 2018-08-02: qty 5

## 2018-08-02 MED ORDER — LACTATED RINGERS IV SOLN
INTRAVENOUS | Status: DC
  Administered 2018-08-02 (×2): via INTRAVENOUS

## 2018-08-02 MED ORDER — ONDANSETRON HCL 4 MG/2ML IJ SOLN
INTRAMUSCULAR | Status: AC
  Filled 2018-08-02: qty 2

## 2018-08-02 MED ORDER — SUCCINYLCHOLINE CHLORIDE 200 MG/10ML IV SOSY
PREFILLED_SYRINGE | INTRAVENOUS | Status: AC
  Filled 2018-08-02: qty 10

## 2018-08-02 MED ORDER — BUPIVACAINE HCL 0.5 % IJ SOLN
INTRAMUSCULAR | Status: DC | PRN
  Administered 2018-08-02: 10 mL

## 2018-08-02 MED ORDER — PROPOFOL 10 MG/ML IV BOLUS
INTRAVENOUS | Status: AC
  Filled 2018-08-02: qty 20

## 2018-08-02 MED ORDER — METOCLOPRAMIDE HCL 5 MG/ML IJ SOLN: 10.0000 mg | Freq: Once | INTRAMUSCULAR | Status: DC | PRN

## 2018-08-02 MED ORDER — FENTANYL CITRATE (PF) 100 MCG/2ML IJ SOLN
25.0000 ug | INTRAMUSCULAR | Status: DC | PRN
  Administered 2018-08-02: 25 ug via INTRAVENOUS
  Administered 2018-08-02: 50 ug via INTRAVENOUS
  Administered 2018-08-02: 25 ug via INTRAVENOUS

## 2018-08-02 MED ORDER — LIDOCAINE HCL (CARDIAC) PF 100 MG/5ML IV SOSY
PREFILLED_SYRINGE | INTRAVENOUS | Status: DC | PRN
  Administered 2018-08-02: 80 mg via INTRAVENOUS

## 2018-08-02 MED ORDER — OXYCODONE HCL 5 MG PO TABS
5.0000 mg | ORAL_TABLET | Freq: Once | ORAL | Status: AC
  Administered 2018-08-02: 5 mg via ORAL

## 2018-08-02 MED ORDER — BUPIVACAINE HCL (PF) 0.5 % IJ SOLN
INTRAMUSCULAR | Status: AC
  Filled 2018-08-02: qty 30

## 2018-08-02 MED ORDER — SUCCINYLCHOLINE CHLORIDE 20 MG/ML IJ SOLN
INTRAMUSCULAR | Status: DC | PRN
  Administered 2018-08-02: 140 mg via INTRAVENOUS

## 2018-08-02 MED ORDER — DEXAMETHASONE SODIUM PHOSPHATE 4 MG/ML IJ SOLN
INTRAMUSCULAR | Status: DC | PRN
  Administered 2018-08-02: 10 mg via INTRAVENOUS

## 2018-08-02 MED ORDER — PROPOFOL 10 MG/ML IV BOLUS
INTRAVENOUS | Status: DC | PRN
  Administered 2018-08-02: 200 mg via INTRAVENOUS

## 2018-08-02 MED ORDER — DEXAMETHASONE SODIUM PHOSPHATE 10 MG/ML IJ SOLN
INTRAMUSCULAR | Status: AC
  Filled 2018-08-02: qty 1

## 2018-08-02 MED ORDER — SCOPOLAMINE 1 MG/3DAYS TD PT72
MEDICATED_PATCH | TRANSDERMAL | Status: AC
  Filled 2018-08-02: qty 1

## 2018-08-02 MED ORDER — SOD CITRATE-CITRIC ACID 500-334 MG/5ML PO SOLN: 30.0000 mL | ORAL | Status: DC

## 2018-08-02 MED ORDER — FENTANYL CITRATE (PF) 100 MCG/2ML IJ SOLN
50.0000 ug | INTRAMUSCULAR | Status: AC | PRN
  Administered 2018-08-02 (×3): 50 ug via INTRAVENOUS

## 2018-08-02 MED ORDER — MIDAZOLAM HCL 2 MG/2ML IJ SOLN
1.0000 mg | INTRAMUSCULAR | Status: DC | PRN
  Administered 2018-08-02: 2 mg via INTRAVENOUS

## 2018-08-02 MED ORDER — SILVER NITRATE-POT NITRATE 75-25 % EX MISC
CUTANEOUS | Status: DC | PRN
  Administered 2018-08-02: 1 via TOPICAL

## 2018-08-02 MED ORDER — PROPOFOL 500 MG/50ML IV EMUL
INTRAVENOUS | Status: DC | PRN
  Administered 2018-08-02: 25 ug/kg/min via INTRAVENOUS

## 2018-08-02 MED ORDER — OXYCODONE HCL 5 MG PO TABS
ORAL_TABLET | ORAL | Status: AC
  Filled 2018-08-02: qty 1

## 2018-08-02 MED ORDER — MIDAZOLAM HCL 2 MG/2ML IJ SOLN
INTRAMUSCULAR | Status: AC
  Filled 2018-08-02: qty 2

## 2018-08-02 MED ORDER — OXYCODONE-ACETAMINOPHEN 5-325 MG PO TABS: 1.0000 | ORAL_TABLET | Freq: Four times a day (QID) | ORAL | 0 refills | Status: DC | PRN

## 2018-08-02 MED ORDER — LACTATED RINGERS IV SOLN: INTRAVENOUS | Status: DC

## 2018-08-02 MED ORDER — SCOPOLAMINE 1 MG/3DAYS TD PT72
1.0000 | MEDICATED_PATCH | Freq: Once | TRANSDERMAL | Status: DC
  Administered 2018-08-02: 1.5 mg via TRANSDERMAL

## 2018-08-02 SURGICAL SUPPLY — 33 items
BRIEF STRETCH FOR OB PAD XXL (UNDERPADS AND DIAPERS) ×3 IMPLANT
CATH ROBINSON RED A/P 16FR (CATHETERS) ×3 IMPLANT
CLIP FILSHIE TUBAL LIGA STRL (Clip) ×3 IMPLANT
DRSG OPSITE POSTOP 3X4 (GAUZE/BANDAGES/DRESSINGS) IMPLANT
DURAPREP 26ML APPLICATOR (WOUND CARE) ×3 IMPLANT
GLOVE BIO SURGEON STRL SZ 6.5 (GLOVE) ×2 IMPLANT
GLOVE BIO SURGEONS STRL SZ 6.5 (GLOVE) ×1
GLOVE BIOGEL PI IND STRL 6.5 (GLOVE) ×2 IMPLANT
GLOVE BIOGEL PI IND STRL 7.0 (GLOVE) ×2 IMPLANT
GLOVE BIOGEL PI INDICATOR 6.5 (GLOVE) ×4
GLOVE BIOGEL PI INDICATOR 7.0 (GLOVE) ×4
GOWN STRL REUS W/TWL LRG LVL3 (GOWN DISPOSABLE) ×6 IMPLANT
NDL INSUFF ACCESS 14 VERSASTEP (NEEDLE) IMPLANT
NEEDLE INSUFFLATION 120MM (ENDOMECHANICALS) ×3 IMPLANT
NS IRRIG 1000ML POUR BTL (IV SOLUTION) ×3 IMPLANT
PACK TRENDGUARD 450 HYBRID PRO (MISCELLANEOUS) ×1 IMPLANT
PACK TRENDGUARD 600 HYBRD PROC (MISCELLANEOUS) IMPLANT
PACK VAGINAL MINOR WOMEN LF (CUSTOM PROCEDURE TRAY) ×3 IMPLANT
PAD OB MATERNITY 4.3X12.25 (PERSONAL CARE ITEMS) ×3 IMPLANT
PAD PREP 24X48 CUFFED NSTRL (MISCELLANEOUS) ×3 IMPLANT
SLEEVE ENDOPATH XCEL 5M (ENDOMECHANICALS) ×3 IMPLANT
SLEEVE SCD COMPRESS KNEE MED (MISCELLANEOUS) ×3 IMPLANT
SLEEVE XCEL OPT CAN 5 100 (ENDOMECHANICALS) IMPLANT
SUT MNCRL AB 4-0 PS2 18 (SUTURE) ×6 IMPLANT
SUT VICRYL 0 UR6 27IN ABS (SUTURE) ×3 IMPLANT
TOWEL GREEN STERILE FF (TOWEL DISPOSABLE) ×6 IMPLANT
TRENDGUARD 450 HYBRID PRO PACK (MISCELLANEOUS) ×3
TRENDGUARD 600 HYBRID PROC PK (MISCELLANEOUS)
TROCAR 5M 150ML BLDLS (TROCAR) ×3 IMPLANT
TROCAR VERSASTEP PLUS 12MM (TROCAR) IMPLANT
TROCAR XCEL NON-BLD 11X100MML (ENDOMECHANICALS) ×6 IMPLANT
TROCAR XCEL NON-BLD 5MMX100MML (ENDOMECHANICALS) ×6 IMPLANT
WARMER LAPAROSCOPE (MISCELLANEOUS) ×3 IMPLANT

## 2018-08-02 NOTE — Anesthesia Preprocedure Evaluation (Addendum)
Anesthesia Evaluation  Patient identified by MRN, date of birth, ID band Patient awake    Reviewed: Allergy & Precautions, NPO status , Patient's Chart, lab work & pertinent test results  History of Anesthesia Complications Negative for: history of anesthetic complications  Airway Mallampati: II  TM Distance: >3 FB Neck ROM: Full    Dental  (+) Dental Advisory Given, Chipped,    Pulmonary neg pulmonary ROS,    breath sounds clear to auscultation       Cardiovascular hypertension, Pt. on medications  Rhythm:Regular Rate:Normal     Neuro/Psych negative neurological ROS  negative psych ROS   GI/Hepatic negative GI ROS, Neg liver ROS,   Endo/Other  Morbid obesity  Renal/GU negative Renal ROS     Musculoskeletal negative musculoskeletal ROS (+)   Abdominal   Peds  Hematology negative hematology ROS (+)   Anesthesia Other Findings Anaphylaxis to both Cipro and Zofran per patient   Reproductive/Obstetrics  Hx pre-eclampsia                            Anesthesia Physical Anesthesia Plan  ASA: III  Anesthesia Plan: General   Post-op Pain Management:    Induction: Intravenous  PONV Risk Score and Plan: 4 or greater and Treatment may vary due to age or medical condition, Ondansetron, Dexamethasone, Midazolam and Scopolamine patch - Pre-op  Airway Management Planned: Oral ETT  Additional Equipment: None  Intra-op Plan:   Post-operative Plan: Extubation in OR  Informed Consent: I have reviewed the patients History and Physical, chart, labs and discussed the procedure including the risks, benefits and alternatives for the proposed anesthesia with the patient or authorized representative who has indicated his/her understanding and acceptance.     Dental advisory given  Plan Discussed with: CRNA and Anesthesiologist  Anesthesia Plan Comments:       Anesthesia Quick  Evaluation

## 2018-08-02 NOTE — Op Note (Addendum)
Jennifer Booth PROCEDURE DATE: 08/02/2018  PREOPERATIVE DIAGNOSES: Unwanted fertility POSTOPERATIVE DIAGNOSES: The same PROCEDURE: Laparoscopic bilateral tubal ligation with Filshie Clips SURGEON:  Dr. Feliz Beam ASSISTANT: none ANESTHESIOLOGIST: Anesthesiologist: Audry Pili, MD CRNA: Lyndee Leo, CRNA  INDICATIONS: 33 y.o. [id number] with aforementioned preoperative diagnoses here today for definitive surgical management.   Risks of surgery were discussed with the patient including but not limited to: bleeding which may require transfusion or reoperation; infection which may require antibiotics; injury to bowel, bladder, ureters or other surrounding organs; need for additional procedures including laparotomy; thromboembolic phenomenon, incisional problems and other postoperative/anesthesia complications. Risk of regret discussed. Patient affirms desire to proceed. Written informed consent was obtained.    FINDINGS:  Normal appearing uterus, fallopian tubes and ovaries bilaterally. One thin omental adhesion to anterior abdominal wall.  ANESTHESIA:    General INTRAVENOUS FLUIDS: 1300 mL ESTIMATED BLOOD LOSS: 25 mL URINE OUTPUT: 150 mL SPECIMENS:  none COMPLICATIONS: None immediate   PROCEDURE IN DETAIL:  The patient had sequential compression devices applied to her lower extremities while in the preoperative area.  She was then taken to the operating room where general anesthesia was administered and was found to be adequate.  She was placed in the dorsal lithotomy position, and was prepped and draped in a sterile manner.  A Foley catheter was inserted into her bladder and attached to constant drainage. A timeout was performed to verify patient and procedure. A uterine manipulator was then advanced into the uterus. Attention was then turned to the patient's abdomen where a 5-mm skin incision was made in the umbilical fold.  The Varess needle was introduced into the abdomen, the CO2  gas started with an opening pressure of 3 mm Hg. Once abdominal pressure reached 15 mm Hg, the Varess needle was removed and the 5 mm Optiview port was advanced into the abdomen under direct visualization with the laparoscope until entry was gained into the abdomen. The introducer was removed and the camera was introduced and survey of the abdomen showed atraumatic entry. Survey of the abdomen and pelvis were as noted above. The patient was then placed into Trendelenburg position.  A 5 mm port was placed at the left ASIS after incision made with a scalpel. A non-bladed trocar was introduced under the abdomen with atraumatic entry noted. A 10 mm port was placed into the supra pubic area after incision with a scalpel. The port was advanced under direct visualization. The Filshie clip application device was introduced via the 10 mm port. The left tube was grasped using an atraumatic grasper and followed out to the fimbriated end. The tube was then gently stretched laterally and a Filshie clip was applied in the mid-isthmic portion of the tube after visualization of the distal portion of the clip just inferior to the tube in the mesosalpinx. Once the clip was applied, the tube was inspected and the Filshie clip was found to completely encase the fallopian tube. A similar procedure was carried on the right side with the Filshie clip noted to completed encase the fallopian tube. The abdomen was inspected and no areas of bleeding noted.   At this point the procedure was completed and all instruments and trocars were removed from the abdomen. The pneumoperitoneum was removed as much as possible. The fascia of the 10 mm port was closed using 0-Vicryl. The skin of all ports was closed using 4-0 Monocryl. 10 mLs 0.25% Marcaine was injected at the incision sites.The uterine manipulator was removed  and a small amount of bleeding noted at the site of the tenaculum that improved with pressure and the application of silver  nitrate.   The patient was extubated without difficulty and taken to PACU in stable condition.   The patient will be discharged to home as per PACU criteria.  Routine postoperative instructions given.  She was prescribed Percocet.  She will follow up in the clinic in 2 weeks for postoperative evaluation.   Feliz Beam, M.D. Attending [facility] [employer])

## 2018-08-02 NOTE — Anesthesia Procedure Notes (Signed)
Procedure Name: Intubation Date/Time: 08/02/2018 1:02 PM Performed by: Lyndee Leo, CRNA Pre-anesthesia Checklist: Patient identified, Emergency Drugs available, Suction available and Patient being monitored Patient Re-evaluated:Patient Re-evaluated prior to induction Oxygen Delivery Method: Circle system utilized Preoxygenation: Pre-oxygenation with 100% oxygen Induction Type: IV induction Ventilation: Mask ventilation without difficulty Laryngoscope Size: Mac and 3 Grade View: Grade I Tube type: Oral Tube size: 7.0 mm Number of attempts: 1 Airway Equipment and Method: Stylet and Oral airway Placement Confirmation: ETT inserted through vocal cords under direct vision,  positive ETCO2 and breath sounds checked- equal and bilateral Secured at: 21 cm Tube secured with: Tape Dental Injury: Teeth and Oropharynx as per pre-operative assessment

## 2018-08-02 NOTE — Transfer of Care (Signed)
Immediate Anesthesia Transfer of Care Note  Patient: Jennifer Booth  Procedure(s) Performed: LAPAROSCOPIC TUBAL LIGATION WITH FILSHIE CLIPS (Bilateral Abdomen)  Patient Location: PACU  Anesthesia Type:General  Level of Consciousness: awake, sedated and patient cooperative  Airway & Oxygen Therapy: Patient Spontanous Breathing  Post-op Assessment: Report given to RN and Post -op Vital signs reviewed and stable  Post vital signs: Reviewed and stable  Last Vitals:  Vitals Value Taken Time  BP    Temp    Pulse 89 08/02/18 1423  Resp 18 08/02/18 1423  SpO2 96 % 08/02/18 1423  Vitals shown include unvalidated device data.  Last Pain:  Vitals:   08/02/18 1221  TempSrc: Oral  PainSc: 0-No pain         Complications: No apparent anesthesia complications

## 2018-08-02 NOTE — Discharge Instructions (Signed)

## 2018-08-02 NOTE — H&P (Signed)
OB/GYN History and Physical  Rayonna Heldman is a 33 y.o. [id number] presenting for scheduled laparoscopic bilateral tubal ligation. She has no acute issues today.     Past Medical History:  Diagnosis Date  . Anemia   . Pre-eclampsia 2018    Past Surgical History:  Procedure Laterality Date  . CESAREAN SECTION     OB History  Gravida Para Term Preterm AB Living  4 4 3 1  0 5  SAB TAB Ectopic Multiple Live Births  0 0 0 1 5    # Outcome Date GA Lbr Len/2nd Weight Sex Delivery Anes PTL Lv  4 Term 06/01/18 [redacted]w[redacted]d 24:32 / 00:13 2594 g F VBAC EPI  LIV  3A Preterm 08/06/16 [redacted]w[redacted]d    CS-LTranv   LIV     Complications: Preeclampsia  3B Preterm 08/06/16 [redacted]w[redacted]d    CS-LTranv     2 Term 2016 [redacted]w[redacted]d    Vag-Spont   LIV     Complications: Preeclampsia, Group B streptococcal infection  1 Term 2007 [redacted]w[redacted]d    Vag-Spont   LIV    Social History   Socioeconomic History  . Marital status: Single    Spouse name: Not on file  . Number of children: Not on file  . Years of education: Not on file  . Highest education level: Not on file  Occupational History  . Not on file  Social Needs  . Financial resource strain: Not hard at all  . Food insecurity    Worry: Never true    Inability: Never true  . Transportation needs    Medical: No    Non-medical: Not on file  Tobacco Use  . Smoking status: Never Smoker  . Smokeless tobacco: Never Used  Substance and Sexual Activity  . Alcohol use: Not Currently  . Drug use: Never  . Sexual activity: Not Currently    Birth control/protection: None  Lifestyle  . Physical activity    Days per week: Not on file    Minutes per session: Not on file  . Stress: Only a little  Relationships  . Social Herbalist on phone: Not on file    Gets together: Not on file    Attends religious service: Not on file    Active member of club or organization: Not on file    Attends meetings of clubs or organizations: Not on file    Relationship status: Not on  file  Other Topics Concern  . Not on file  Social History Narrative  . Not on file    Family History  Problem Relation Age of Onset  . Hypertension Mother   . Diabetes Mother   . Hypercholesterolemia Father   . Hypertension Father   . Heart disease Maternal Grandmother   . Diabetes Maternal Grandmother     Medications Prior to Admission  Medication Sig Dispense Refill Last Dose  . ferrous sulfate 325 (65 FE) MG tablet Take 1 tablet (325 mg total) by mouth daily. 30 tablet 0 08/01/2018 at Unknown time  . ibuprofen (ADVIL) 600 MG tablet Take 1 tablet (600 mg total) by mouth every 6 (six) hours. 30 tablet 0 Past Week at Unknown time  . NIFEdipine (PROCARDIA-XL/NIFEDICAL-XL) 30 MG 24 hr tablet Take 1 tablet (30 mg total) by mouth daily. 30 tablet 2 08/02/2018 at Unknown time  . Prenatal Vit-Fe Fumarate-FA (PRENATAL MULTIVITAMIN) TABS tablet Take 1 tablet by mouth daily at 12 noon.   08/01/2018 at Unknown time  Allergies  Allergen Reactions  . Ciprofloxacin Anaphylaxis  . Zofran [Ondansetron Hcl] Anaphylaxis    Review of Systems: Negative except for what is mentioned in HPI.     Physical Exam: BP 131/81   Pulse 81   Temp 98.4 F (36.9 C) (Oral)   Ht 5\' 5"  (1.651 m)   Wt 117.3 kg   LMP 07/25/2018 (Exact Date) Comment: breastfeedinig  SpO2 100%   Breastfeeding Yes   BMI 43.03 kg/m  CONSTITUTIONAL: Well-developed, well-nourished female in no acute distress.  HENT:  Normocephalic, atraumatic, External right and left ear normal. Oropharynx is clear and moist EYES: Conjunctivae and EOM are normal. Pupils are equal, round, and reactive to light. No scleral icterus.  NECK: Normal range of motion, supple, no masses SKIN: Skin is warm and dry. No rash noted. Not diaphoretic. No erythema. No pallor. [place]: Alert and oriented to person, place, and time. Normal reflexes, muscle tone coordination. No cranial nerve deficit noted. PSYCHIATRIC: Normal mood and affect. Normal  behavior. Normal judgment and thought content. CARDIOVASCULAR: Normal heart rate noted RESPIRATORY: Effort normal, no problems with respiration noted ABDOMEN: Soft, nontender, nondistended, gravid. Well-healed Pfannenstiel incision. PELVIC: Deferred MUSCULOSKELETAL: Normal range of motion. No edema and no tenderness. 2+ distal pulses.   Pertinent Labs/Studies:   Results for orders placed or performed during the hospital encounter of 08/02/18 (from the past 72 hour(s))  CBC     Status: Abnormal   Collection Time: 08/02/18 12:00 PM  Result Value Ref Range   WBC 4.4 4.0 - 10.5 K/uL   RBC 3.60 (L) 3.87 - 5.11 MIL/uL   Hemoglobin 11.3 (L) 12.0 - 15.0 g/dL   HCT 34.6 (L) 36.0 - 46.0 %   MCV 96.1 80.0 - 100.0 fL   MCH 31.4 26.0 - 34.0 pg   MCHC 32.7 30.0 - 36.0 g/dL   RDW 13.2 11.5 - 15.5 %   Platelets 283 150 - 400 K/uL   nRBC 0.0 0.0 - 0.2 %    Comment: Performed at [facility], [place] [address] [place], [id number] [address]   UPT: negative     Assessment and Plan :Emryn Flanery is a 33 y.o. [id number] admitted for laparoscopic bilateral tubal ligation. Reviewed risks of laparoscopic bilateral tubal ligation with patient. She understands that this is a permanent procedure and she will not be able to have children after it is done. Reviewed risks of bilateral tubal ligation including infection, hemorrhage, damage to surrounding tissue and organs, possibility of laparotomy or other indicated procedures, risk of regret. Reviewed risks of anesthesia including risk of clot, stroke, MI. Reviewed that bilateral tubal ligation is not 100% effective and she should take a pregnancy test if she believes for any reason she may be pregnant. Reviewed slightly increased risk of ectopic pregnancy and need to seek care if she becomes pregnant. She understands this is an elective procedure. She consents to blood transfusion if necessary. Reviewed expected recovery course.    Plan for laparoscopic  bilateral tubal ligation NPO   K. Arvilla Meres, M.D. Attending [place], [facility] for [employer], [place]

## 2018-08-02 NOTE — Anesthesia Postprocedure Evaluation (Signed)
Anesthesia Post Note  Patient: Isolde Raybourn  Procedure(s) Performed: LAPAROSCOPIC TUBAL LIGATION WITH FILSHIE CLIPS (Bilateral Abdomen)     Patient location during evaluation: PACU Anesthesia Type: General Level of consciousness: awake and alert Pain management: pain level controlled Vital Signs Assessment: post-procedure vital signs reviewed and stable Respiratory status: spontaneous breathing, nonlabored ventilation and respiratory function stable Cardiovascular status: blood pressure returned to baseline and stable Postop Assessment: no apparent nausea or vomiting Anesthetic complications: no    Last Vitals:  Vitals:   08/02/18 1510 08/02/18 1515  BP: (!) 113/49   Pulse: 86 85  Resp: 17 12  Temp:    SpO2: 99% 100%    Last Pain:  Vitals:   08/02/18 1500  TempSrc:   PainSc: [place]

## 2018-08-03 ENCOUNTER — Telehealth: Payer: Self-pay | Admitting: Obstetrics and Gynecology

## 2018-08-03 ENCOUNTER — Encounter (HOSPITAL_BASED_OUTPATIENT_CLINIC_OR_DEPARTMENT_OTHER): Payer: Self-pay | Admitting: Obstetrics and Gynecology

## 2018-08-03 NOTE — Telephone Encounter (Signed)
Called patient ot check in after surgery yesterdya, she is feeling well, sore but much improved today. No acute issues. To call with any.    Feliz Beam, M.D. Attending Center for [employer] Fish farm manager)

## 2018-08-10 ENCOUNTER — Encounter: Payer: Self-pay | Admitting: Obstetrics and Gynecology

## 2018-08-10 ENCOUNTER — Ambulatory Visit (INDEPENDENT_AMBULATORY_CARE_PROVIDER_SITE_OTHER): Payer: Medicaid Other | Admitting: Obstetrics and Gynecology

## 2018-08-10 VITALS — BP 127/85 | HR 99 | Wt 262.0 lb

## 2018-08-10 DIAGNOSIS — Z9889 Other specified postprocedural states: Secondary | ICD-10-CM

## 2018-08-10 DIAGNOSIS — Z3009 Encounter for other general counseling and advice on contraception: Secondary | ICD-10-CM

## 2018-08-10 DIAGNOSIS — R87610 Atypical squamous cells of undetermined significance on cytologic smear of cervix (ASC-US): Secondary | ICD-10-CM

## 2018-08-10 DIAGNOSIS — R87810 Cervical high risk human papillomavirus (HPV) DNA test positive: Secondary | ICD-10-CM

## 2018-08-10 NOTE — Progress Notes (Signed)
   GYNECOLOGY OFFICE FOLLOW UP NOTE  History:  33 y.o. [id number] here today for follow up for laparoscopic BTL on 08/02/18. She is doing well, reports minimal pain that is mostly gone now. Reports no issues with eating, voiding, stooling. Otherwise feeling very well.     Past Medical History:  Diagnosis Date  . Anemia   . Pre-eclampsia 2018    Past Surgical History:  Procedure Laterality Date  . CESAREAN SECTION    . LAPAROSCOPIC TUBAL LIGATION Bilateral 08/02/2018   Procedure: LAPAROSCOPIC TUBAL LIGATION WITH FILSHIE CLIPS;  Surgeon: Sloan Leiter, MD;  Location: [place] [facility];  Service: Gynecology;  Laterality: Bilateral;     Current Outpatient Medications:  .  oxyCODONE-acetaminophen (PERCOCET/ROXICET) 5-325 MG tablet, Take 1 tablet by mouth every 6 (six) hours as needed., Disp: 30 tablet, Rfl: 0 .  ferrous sulfate 325 (65 FE) MG tablet, Take 1 tablet (325 mg total) by mouth daily., Disp: 30 tablet, Rfl: 0 .  ibuprofen (ADVIL) 600 MG tablet, Take 1 tablet (600 mg total) by mouth every 6 (six) hours., Disp: 30 tablet, Rfl: 0 .  NIFEdipine (PROCARDIA-XL/NIFEDICAL-XL) 30 MG 24 hr tablet, Take 1 tablet (30 mg total) by mouth daily., Disp: 30 tablet, Rfl: 2 .  Prenatal Vit-Fe Fumarate-FA (PRENATAL MULTIVITAMIN) TABS tablet, Take 1 tablet by mouth daily at 12 noon., Disp: , Rfl:   The following portions of the patient's history were reviewed and updated as appropriate: allergies, current medications, past family history, past medical history, past social history, past surgical history and problem list.   Review of Systems:  Pertinent items noted in HPI and remainder of comprehensive ROS otherwise negative.   Objective:  Physical Exam BP 127/85   Pulse 99   Wt 262 lb (118.8 kg)   LMP 07/25/2018 (Exact Date) Comment: breastfeedinig  BMI 43.60 kg/m  CONSTITUTIONAL: Well-developed, well-nourished female in no acute distress.  HENT:  Normocephalic, atraumatic. External right  and left ear normal. Oropharynx is clear and moist EYES: Conjunctivae and EOM are normal. Pupils are equal, round, and reactive to light. No scleral icterus.  NECK: Normal range of motion, supple, no masses SKIN: Skin is warm and dry. No rash noted. Not diaphoretic. No erythema. No pallor. NEUROLOGIC: Alert and oriented to person, place, and time. Normal reflexes, muscle tone coordination. No cranial nerve deficit noted. PSYCHIATRIC: Normal mood and affect. Normal behavior. Normal judgment and thought content. CARDIOVASCULAR: Normal heart rate noted RESPIRATORY: Effort normal, no problems with respiration noted ABDOMEN: Soft, no distention noted.  Incision sites clean, dry, well approximated and healing, suture removed from umbilical and LLQ site PELVIC: deferred MUSCULOSKELETAL: Normal range of motion. No edema noted.  Assessment & Plan:  1. Unwanted fertility S/p BTL Suture removed today  2. ASCUS with positive high risk HPV cervical Return for colpo   Routine preventative health maintenance measures emphasized. Please refer to After Visit Summary for other counseling recommendations.   Return in about 4 weeks (around 09/07/2018) for colpo.   Feliz Beam, M.D. Attending Center for [employer] Fish farm manager)

## 2018-09-11 ENCOUNTER — Encounter: Payer: Medicaid Other | Admitting: Obstetrics and Gynecology

## 2018-09-27 ENCOUNTER — Ambulatory Visit: Payer: Medicaid Other | Admitting: Obstetrics and Gynecology

## 2018-09-27 ENCOUNTER — Encounter: Payer: Self-pay | Admitting: Obstetrics and Gynecology

## 2018-09-27 ENCOUNTER — Other Ambulatory Visit (HOSPITAL_COMMUNITY)
Admission: RE | Admit: 2018-09-27 | Discharge: 2018-09-27 | Disposition: A | Discharge status: Home / Self Care | Payer: Medicaid Other | Source: Ambulatory Visit | Attending: Obstetrics and Gynecology | Admitting: Obstetrics and Gynecology

## 2018-09-27 ENCOUNTER — Other Ambulatory Visit: Payer: Self-pay

## 2018-09-27 VITALS — BP 129/88 | HR 85 | Ht 65.0 in | Wt 279.8 lb

## 2018-09-27 DIAGNOSIS — N870 Mild cervical dysplasia: Secondary | ICD-10-CM | POA: Diagnosis not present

## 2018-09-27 DIAGNOSIS — R87610 Atypical squamous cells of undetermined significance on cytologic smear of cervix (ASC-US): Secondary | ICD-10-CM

## 2018-09-27 DIAGNOSIS — R87810 Cervical high risk human papillomavirus (HPV) DNA test positive: Secondary | ICD-10-CM | POA: Diagnosis present

## 2018-09-27 NOTE — Progress Notes (Signed)
Colposcopy Procedure Note  Jennifer Booth is a 33 y.o. [id number] here for colposcopy.  Indications:  Pap 04/2018: ASCUS, positive high risk HPV (neg 16/18/45)  Procedure Details  LMP 09/19/2018 ; UPT negative.    The risks (including infection, bleeding, pain) and benefits of the procedure were explained to the patient and written informed consent was obtained.  The patient was placed in the dorsal lithotomy position. A Graves was speculum inserted in the vagina, and the cervix was visualized.  The cervix was stained with acetic acid and visualized using the colposcope under magnification as well as with a green filter. Findings as below. Cervical biopsies were taken at 12, 6, 9 o'clock. Endocervical curettage then performed in all four quadrants. Small amount of bleeding noted that improved with pressure. Silver nitrate applied with good hemostasis noted. Patient tolerating procedure well.  Findings: acetowhite changes at external os and squamocolumnar junction, punctate vascularity @ 12  Impression: CIN1, low grade  Adequate: yes  Specimens: cervical biopsy (12, 6, 9 o'clock), endocervical curettage  Condition: Stable  Complications: None  Plan: The patient was advised to call for any fever or for prolonged or severe pain or bleeding. She was advised to use OTC analgesics as needed for mild to moderate pain. She was advised to avoid vaginal intercourse for 48 hours or until the bleeding has completely stopped.  Will base further management on results of biopsy.   Feliz Beam, M.D. Attending Center for [employer] Fish farm manager)

## 2019-03-07 DIAGNOSIS — Z03818 Encounter for observation for suspected exposure to other biological agents ruled out: Secondary | ICD-10-CM | POA: Diagnosis not present

## 2019-03-07 DIAGNOSIS — Z20828 Contact with and (suspected) exposure to other viral communicable diseases: Secondary | ICD-10-CM | POA: Diagnosis not present

## 2019-04-27 DIAGNOSIS — R112 Nausea with vomiting, unspecified: Secondary | ICD-10-CM | POA: Diagnosis not present

## 2019-04-27 DIAGNOSIS — R197 Diarrhea, unspecified: Secondary | ICD-10-CM | POA: Diagnosis not present

## 2019-11-06 ENCOUNTER — Emergency Department
Admission: EM | Admit: 2019-11-06 | Discharge: 2019-11-06 | Disposition: A | Discharge status: Home / Self Care | Payer: Medicaid Other | Attending: Emergency Medicine | Admitting: Emergency Medicine

## 2019-11-06 ENCOUNTER — Emergency Department: Payer: Medicaid Other

## 2019-11-06 ENCOUNTER — Other Ambulatory Visit: Payer: Self-pay

## 2019-11-06 DIAGNOSIS — R0789 Other chest pain: Secondary | ICD-10-CM | POA: Diagnosis not present

## 2019-11-06 DIAGNOSIS — R079 Chest pain, unspecified: Secondary | ICD-10-CM | POA: Diagnosis not present

## 2019-11-06 DIAGNOSIS — J9811 Atelectasis: Secondary | ICD-10-CM | POA: Diagnosis not present

## 2019-11-06 LAB — CBC
HCT: 34.5 % — ABNORMAL LOW (ref 36.0–46.0)
Hemoglobin: 11.5 g/dL — ABNORMAL LOW (ref 12.0–15.0)
MCH: 31.8 pg (ref 26.0–34.0)
MCHC: 33.3 g/dL (ref 30.0–36.0)
MCV: 95.3 fL (ref 80.0–100.0)
Platelets: 307 10*3/uL (ref 150–400)
RBC: 3.62 MIL/uL — ABNORMAL LOW (ref 3.87–5.11)
RDW: 14.6 % (ref 11.5–15.5)
WBC: 7.5 10*3/uL (ref 4.0–10.5)
nRBC: 0 % (ref 0.0–0.2)

## 2019-11-06 LAB — BASIC METABOLIC PANEL
Anion gap: 7 (ref 5–15)
BUN: 12 mg/dL (ref 6–20)
CO2: 26 mmol/L (ref 22–32)
Calcium: 8.8 mg/dL — ABNORMAL LOW (ref 8.9–10.3)
Chloride: 104 mmol/L (ref 98–111)
Creatinine, Ser: 1.03 mg/dL — ABNORMAL HIGH (ref 0.44–1.00)
GFR calc Af Amer: >60 mL/min (ref >60)
GFR calc non Af Amer: >60 mL/min (ref >60)
Glucose, Bld: 90 mg/dL (ref 70–99)
Potassium: 3.6 mmol/L (ref 3.5–5.1)
Sodium: 137 mmol/L (ref 135–145)

## 2019-11-06 LAB — POCT PREGNANCY, URINE: Preg Test, Ur: NEGATIVE

## 2019-11-06 LAB — TROPONIN I (HIGH SENSITIVITY): Troponin I (High Sensitivity): 4 ng/L (ref <18)

## 2019-11-06 MED ORDER — LIDOCAINE VISCOUS HCL 2 % MT SOLN
15.0000 mL | Freq: Once | OROMUCOSAL | Status: AC
  Administered 2019-11-06: 15 mL via ORAL
  Filled 2019-11-06: qty 15

## 2019-11-06 MED ORDER — PANTOPRAZOLE SODIUM 20 MG PO TBEC: 20.0000 mg | DELAYED_RELEASE_TABLET | Freq: Every day | ORAL | 1 refills | Status: DC

## 2019-11-06 MED ORDER — ALUM & MAG HYDROXIDE-SIMETH 200-200-20 MG/5ML PO SUSP
30.0000 mL | Freq: Once | ORAL | Status: AC
  Administered 2019-11-06: 30 mL via ORAL
  Filled 2019-11-06: qty 30

## 2019-11-06 NOTE — ED Provider Notes (Signed)
[facility] Emergency Department Provider Note   ____________________________________________    I have reviewed the triage vital signs and the nursing notes.   HISTORY  Chief Complaint Chest Pain     HPI Jennifer Booth is a 34 y.o. female with history as noted below who presents with complaints of chest discomfort.  Patient describes a burning/tightness discomfort in her central chest which occurred this morning while she was not exerting herself.  Denies shortness of breath.  No calf pain or swelling.  No history of DVT.  Not on hormones.  No fevers chills or cough.  No sick contacts.  Has not take anything for this.  Reports it is improved somewhat.  No radiation.  Past Medical History:  Diagnosis Date  . Anemia   . Pre-eclampsia 2018    Patient Active Problem List   Diagnosis Date Noted  . ASCUS with positive high risk HPV cervical 07/13/2018  . Anemia of pregnancy 06/02/2018  . Mild pre-eclampsia 06/01/2018  . VBAC, delivered 06/01/2018  . H/O pre-eclampsia in prior pregnancy, currently pregnant 05/03/2018  . Unwanted fertility 05/03/2018  . History of C-section 04/24/2018  . Late prenatal care affecting pregnancy in third trimester 04/19/2018  . History of preterm delivery 04/19/2018  . History of twin pregnancy in prior pregnancy 04/19/2018  . Supervision of other normal pregnancy, antepartum 04/18/2018    Past Surgical History:  Procedure Laterality Date  . CESAREAN SECTION    . LAPAROSCOPIC TUBAL LIGATION Bilateral 08/02/2018   Procedure: LAPAROSCOPIC TUBAL LIGATION WITH FILSHIE CLIPS;  Surgeon: Sloan Leiter, MD;  Location: [facility];  Service: Gynecology;  Laterality: Bilateral;    Prior to Admission medications   Medication Sig Start Date End Date Taking? Authorizing Provider  ferrous sulfate 325 (65 FE) MG tablet Take 1 tablet (325 mg total) by mouth daily. 03/07/18   Lajean Manes, CNM  ibuprofen  (ADVIL) 600 MG tablet Take 1 tablet (600 mg total) by mouth every 6 (six) hours. Patient not taking: Reported on 09/27/2018 06/03/18   Seabron Spates, CNM  NIFEdipine (PROCARDIA-XL/NIFEDICAL-XL) 30 MG 24 hr tablet Take 1 tablet (30 mg total) by mouth daily. Patient not taking: Reported on 09/27/2018 07/13/18   Sloan Leiter, MD  oxyCODONE-acetaminophen (PERCOCET/ROXICET) 5-325 MG tablet Take 1 tablet by mouth every 6 (six) hours as needed. Patient not taking: Reported on 09/27/2018 08/02/18   Sloan Leiter, MD  pantoprazole (PROTONIX) 20 MG tablet Take 1 tablet (20 mg total) by mouth daily. 11/06/19 11/05/20  Lavonia Drafts, MD  Prenatal Vit-Fe Fumarate-FA (PRENATAL MULTIVITAMIN) TABS tablet Take 1 tablet by mouth daily at 12 noon.    [provider]     Allergies Ciprofloxacin and Zofran [ondansetron hcl]  Family History  Problem Relation Age of Onset  . Hypertension Mother   . Diabetes Mother   . Hypercholesterolemia Father   . Hypertension Father   . Heart disease Maternal Grandmother   . Diabetes Maternal Grandmother     Social History Social History   Tobacco Use  . Smoking status: Never Smoker  . Smokeless tobacco: Never Used  Vaping Use  . Vaping Use: Never used  Substance Use Topics  . Alcohol use: Not Currently  . Drug use: Never    Review of Systems  Constitutional: No fever/chills Eyes: No visual changes.  ENT: No sore throat. Cardiovascular: As above Respiratory: Denies shortness of breath. Gastrointestinal: No abdominal pain.  No nausea, no vomiting.  Genitourinary: Negative for dysuria. Musculoskeletal: Negative for back pain. Skin: Negative for rash. Neurological: Negative for headaches or weakness   ____________________________________________   PHYSICAL EXAM:  VITAL SIGNS: ED Triage Vitals [11/06/19 1030]  Enc Vitals Group     BP (!) 173/113     Pulse Rate 80     Resp 17     Temp 98.8 F (37.1 C)     Temp Source Oral     SpO2 97 %      Weight 117.9 kg (260 lb)     Height 1.651 m (5\' 5" )     Head Circumference      Peak Flow      Pain Score 7     Pain Loc      Pain Edu?      Excl. in [place]?     Constitutional: Alert and oriented.   Nose: No congestion/rhinnorhea. Mouth/Throat: Mucous membranes are moist.    Cardiovascular: Normal rate, regular rhythm. Grossly normal heart sounds.  Good peripheral circulation. Respiratory: Normal respiratory effort.  No retractions. Lungs CTAB. Gastrointestinal: Soft and nontender. No distention.    Musculoskeletal: No lower extremity tenderness nor edema.  Warm and well perfused Neurologic:  Normal speech and language. No gross focal neurologic deficits are appreciated.  Skin:  Skin is warm, dry and intact. No rash noted. Psychiatric: Mood and affect are normal. Speech and behavior are normal.  ____________________________________________   LABS (all labs ordered are listed, but only abnormal results are displayed)  Labs Reviewed  BASIC METABOLIC PANEL - Abnormal; Notable for the following components:      Result Value   Creatinine, Ser 1.03 (*)    Calcium 8.8 (*)    All other components within normal limits  CBC - Abnormal; Notable for the following components:   RBC 3.62 (*)    Hemoglobin 11.5 (*)    HCT 34.5 (*)    All other components within normal limits  POCT PREGNANCY, URINE  POC URINE PREG, ED  TROPONIN I (HIGH SENSITIVITY)   ____________________________________________  EKG  ED ECG REPORT I, Lavonia Drafts, the attending physician, personally viewed and interpreted this ECG.  Date: 11/06/2019  Rhythm: normal sinus rhythm QRS Axis: normal Intervals: normal ST/T Wave abnormalities: normal Narrative Interpretation: no evidence of acute ischemia  ____________________________________________  RADIOLOGY  Chest x-ray reviewed by me, no infiltrate effusion or pneumothorax ____________________________________________   PROCEDURES  Procedure(s)  performed: No  Procedures   Critical Care performed: No ____________________________________________   INITIAL IMPRESSION / ASSESSMENT AND PLAN / ED COURSE  Pertinent labs & imaging results that were available during my care of the patient were reviewed by me and considered in my medical decision making (see chart for details).  Patient well-appearing and in no acute distress.  Chest discomfort as noted above, now improving.  Differential includes GERD, less likely myocarditis/pericarditis, less likely ACS or arrhythmia  EKG today is quite reassuring.  Chest x-ray is unremarkable as noted above.  Troponin is normal.  Lab work is otherwise normal.  Patient treated with GI cocktail with resolution of pain.  We did discuss doing further testing however given that patient has had significant provement after GI cocktail do suspect GERD as a cause of her discomfort, will Rx PPI, return precautions discussed    ____________________________________________   FINAL CLINICAL IMPRESSION(S) / ED DIAGNOSES  Final diagnoses:  Atypical chest pain        Note:  This document was prepared using Dragon voice recognition software  and may include unintentional dictation errors.   Lavonia Drafts, MD 11/06/19 [id number]

## 2019-11-06 NOTE — ED Triage Notes (Signed)
Pt c/o central chest pain with SOB and dizziness since this morning. Denies N/V/diaphoresis or other sx.

## 2019-11-19 ENCOUNTER — Ambulatory Visit: Payer: Medicaid Other | Admitting: Obstetrics

## 2019-12-17 ENCOUNTER — Other Ambulatory Visit: Payer: Self-pay

## 2019-12-17 ENCOUNTER — Encounter (HOSPITAL_COMMUNITY): Payer: Self-pay | Admitting: Emergency Medicine

## 2019-12-17 ENCOUNTER — Emergency Department (HOSPITAL_COMMUNITY): Payer: Medicaid Other

## 2019-12-17 ENCOUNTER — Emergency Department (HOSPITAL_COMMUNITY)
Admission: EM | Admit: 2019-12-17 | Discharge: 2019-12-18 | Disposition: A | Discharge status: Home / Self Care | Payer: Medicaid Other | Attending: Emergency Medicine | Admitting: Emergency Medicine

## 2019-12-17 DIAGNOSIS — R0602 Shortness of breath: Secondary | ICD-10-CM | POA: Insufficient documentation

## 2019-12-17 DIAGNOSIS — R42 Dizziness and giddiness: Secondary | ICD-10-CM | POA: Diagnosis not present

## 2019-12-17 DIAGNOSIS — H538 Other visual disturbances: Secondary | ICD-10-CM | POA: Insufficient documentation

## 2019-12-17 DIAGNOSIS — R110 Nausea: Secondary | ICD-10-CM | POA: Diagnosis not present

## 2019-12-17 DIAGNOSIS — R079 Chest pain, unspecified: Secondary | ICD-10-CM | POA: Insufficient documentation

## 2019-12-17 DIAGNOSIS — Z5321 Procedure and treatment not carried out due to patient leaving prior to being seen by health care provider: Secondary | ICD-10-CM | POA: Insufficient documentation

## 2019-12-17 DIAGNOSIS — R55 Syncope and collapse: Secondary | ICD-10-CM | POA: Diagnosis not present

## 2019-12-17 DIAGNOSIS — R21 Rash and other nonspecific skin eruption: Secondary | ICD-10-CM | POA: Diagnosis not present

## 2019-12-17 LAB — BASIC METABOLIC PANEL
Anion gap: 11 (ref 5–15)
BUN: 13 mg/dL (ref 6–20)
CO2: 25 mmol/L (ref 22–32)
Calcium: 9.3 mg/dL (ref 8.9–10.3)
Chloride: 102 mmol/L (ref 98–111)
Creatinine, Ser: 1.11 mg/dL — ABNORMAL HIGH (ref 0.44–1.00)
GFR, Estimated: >60 mL/min (ref >60)
Glucose, Bld: 104 mg/dL — ABNORMAL HIGH (ref 70–99)
Potassium: 3.5 mmol/L (ref 3.5–5.1)
Sodium: 138 mmol/L (ref 135–145)

## 2019-12-17 LAB — TROPONIN I (HIGH SENSITIVITY)
Troponin I (High Sensitivity): 3 ng/L (ref <18)
Troponin I (High Sensitivity): 3 ng/L (ref <18)

## 2019-12-17 LAB — CBC
HCT: 35.9 % — ABNORMAL LOW (ref 36.0–46.0)
Hemoglobin: 11.6 g/dL — ABNORMAL LOW (ref 12.0–15.0)
MCH: 31.5 pg (ref 26.0–34.0)
MCHC: 32.3 g/dL (ref 30.0–36.0)
MCV: 97.6 fL (ref 80.0–100.0)
Platelets: 326 10*3/uL (ref 150–400)
RBC: 3.68 MIL/uL — ABNORMAL LOW (ref 3.87–5.11)
RDW: 14.4 % (ref 11.5–15.5)
WBC: 6 10*3/uL (ref 4.0–10.5)
nRBC: 0 % (ref 0.0–0.2)

## 2019-12-17 LAB — I-STAT BETA HCG BLOOD, ED (MC, WL, AP ONLY): I-stat hCG, quantitative: <5 m[IU]/mL (ref <5)

## 2019-12-17 NOTE — ED Triage Notes (Signed)
Pt c/o mid cp with sob, and nausea since this morning at 9 am.

## 2019-12-18 ENCOUNTER — Ambulatory Visit (HOSPITAL_COMMUNITY)
Admission: EM | Admit: 2019-12-18 | Discharge: 2019-12-18 | Disposition: A | Discharge status: Nursing Home (Includes ICF) | Payer: Medicaid Other | Attending: Urgent Care | Admitting: Urgent Care

## 2019-12-18 ENCOUNTER — Emergency Department (HOSPITAL_COMMUNITY)
Admission: EM | Admit: 2019-12-18 | Discharge: 2019-12-19 | Disposition: A | Discharge status: Home / Self Care | Payer: Medicaid Other | Source: Home / Self Care

## 2019-12-18 ENCOUNTER — Encounter (HOSPITAL_COMMUNITY): Payer: Self-pay

## 2019-12-18 DIAGNOSIS — R42 Dizziness and giddiness: Secondary | ICD-10-CM | POA: Insufficient documentation

## 2019-12-18 DIAGNOSIS — H538 Other visual disturbances: Secondary | ICD-10-CM | POA: Insufficient documentation

## 2019-12-18 DIAGNOSIS — Z5321 Procedure and treatment not carried out due to patient leaving prior to being seen by health care provider: Secondary | ICD-10-CM | POA: Insufficient documentation

## 2019-12-18 DIAGNOSIS — R0602 Shortness of breath: Secondary | ICD-10-CM | POA: Insufficient documentation

## 2019-12-18 DIAGNOSIS — R079 Chest pain, unspecified: Secondary | ICD-10-CM

## 2019-12-18 DIAGNOSIS — R1110 Vomiting, unspecified: Secondary | ICD-10-CM | POA: Insufficient documentation

## 2019-12-18 DIAGNOSIS — R55 Syncope and collapse: Secondary | ICD-10-CM | POA: Insufficient documentation

## 2019-12-18 LAB — CBG MONITORING, ED: Glucose-Capillary: 116 mg/dL — ABNORMAL HIGH (ref 70–99)

## 2019-12-18 NOTE — ED Notes (Signed)
Pt has not returned to the waiting room. This tech also attempted to contact pt on mobile w/no response. Charge RN, [place], notified.

## 2019-12-18 NOTE — ED Triage Notes (Signed)
Onset yesterday pt got chest pain,dizziness, shortness of breath, syncopal episode and hit head on floor, came to ED waited 4 hours but LWBS.  Pt still having mid constant chest pain, worse with deep breathing, nothing makes better.  Took tylenol and aspirin with no relief. Onset today blurred vision.  Wears contacts and has them in now, but still with blurred vision and sensitive to light.   Has vomited x 1 since hitting head last night.

## 2019-12-18 NOTE — ED Triage Notes (Signed)
Pt reports starting with left upper chest pain, dizziness, SOB constantly since yesterday.  Reports syncopal episode (witnessed) at home yesterday.  States was in ED x 4 hrs last night, but LWBS.  Continues with sxs.  Denies n/v.  C/O bilat blurred vision.  Chest pain worse with deep breathing.  Skin warm, dry.  Pt alert and oriented.

## 2019-12-18 NOTE — ED Notes (Signed)
Pt not in waiting room at this time. Will check again in 10 min.

## 2019-12-18 NOTE — ED Notes (Signed)
EKG shown to Dr. Meda Coffee; report provided.  Pt to go to ED for further eval.  Pt verbalized understanding.  Patient is being discharged from the Urgent [place] and sent to the Emergency Department via private vehicle with mother. Per Dr. Meda Coffee, patient is stable but in need of higher level of care due to syncopal episode, chest pain, dizziness, SOB. Patient is aware and verbalizes understanding of plan of care.  Vitals:   12/18/19 1853  BP: (!) 151/85  Pulse: 99  Resp: 16  Temp: 98.3 F (36.8 C)  SpO2: 100%

## 2019-12-18 NOTE — ED Notes (Signed)
Pt left due to not being seen quick enough 

## 2020-01-16 ENCOUNTER — Ambulatory Visit (HOSPITAL_COMMUNITY)
Admission: EM | Admit: 2020-01-16 | Discharge: 2020-01-16 | Disposition: A | Discharge status: Home / Self Care | Payer: Medicaid Other | Attending: Family Medicine | Admitting: Family Medicine

## 2020-01-16 ENCOUNTER — Encounter (HOSPITAL_COMMUNITY): Payer: Self-pay

## 2020-01-16 ENCOUNTER — Other Ambulatory Visit: Payer: Self-pay

## 2020-01-16 DIAGNOSIS — R1032 Left lower quadrant pain: Secondary | ICD-10-CM | POA: Diagnosis not present

## 2020-01-16 DIAGNOSIS — R040 Epistaxis: Secondary | ICD-10-CM

## 2020-01-16 DIAGNOSIS — R519 Headache, unspecified: Secondary | ICD-10-CM

## 2020-01-16 LAB — POCT URINALYSIS DIPSTICK, ED / UC
Glucose, UA: NEGATIVE mg/dL
Hgb urine dipstick: NEGATIVE
Ketones, ur: 15 mg/dL — AB
Leukocytes,Ua: NEGATIVE
Nitrite: NEGATIVE
Protein, ur: 30 mg/dL — AB
Specific Gravity, Urine: >=1.03 (ref 1.005–1.030)
Urobilinogen, UA: 0.2 mg/dL (ref 0.0–1.0)
pH: 6 (ref 5.0–8.0)

## 2020-01-16 LAB — POC URINE PREG, ED: Preg Test, Ur: NEGATIVE

## 2020-01-16 MED ORDER — KETOROLAC TROMETHAMINE 60 MG/2ML IM SOLN
60.0000 mg | Freq: Once | INTRAMUSCULAR | Status: AC
  Administered 2020-01-16: 60 mg via INTRAMUSCULAR

## 2020-01-16 MED ORDER — KETOROLAC TROMETHAMINE 60 MG/2ML IM SOLN
INTRAMUSCULAR | Status: AC
  Filled 2020-01-16: qty 2

## 2020-01-16 NOTE — ED Provider Notes (Signed)
Jennifer Booth    CSN: [id number] Arrival date & time: 01/16/20  1611      History   Chief Complaint Chief Complaint  Patient presents with  . Abdominal Pain    lower left    HPI Jennifer Booth is a 34 y.o. female.   Here today with 2 days of worsening constant sharp LLQ abdominal/pelvic pain that is worst with movement. She also today developed a frontal headache and about an hour ago had a nosebleed that was not provoked. She denies any fever, chills, cough, congestion, CP, SOB, N/V/D, flank pain, vaginal sxs, change in BM or urinary habits. Has tried OTC pain relievers without benefit. Does have known hx of allergic rhinitis, on antihistamines daily. Had tubal ligation procedure but otherwise no abdominal surgical hx.      Past Medical History:  Diagnosis Date  . Anemia   . Pre-eclampsia 2018    Patient Active Problem List   Diagnosis Date Noted  . ASCUS with positive high risk HPV cervical 07/13/2018  . Anemia of pregnancy 06/02/2018  . Mild pre-eclampsia 06/01/2018  . VBAC, delivered 06/01/2018  . H/O pre-eclampsia in prior pregnancy, currently pregnant 05/03/2018  . Unwanted fertility 05/03/2018  . History of C-section 04/24/2018  . Late prenatal care affecting pregnancy in third trimester 04/19/2018  . History of preterm delivery 04/19/2018  . History of twin pregnancy in prior pregnancy 04/19/2018  . Supervision of other normal pregnancy, antepartum 04/18/2018    Past Surgical History:  Procedure Laterality Date  . CESAREAN SECTION    . LAPAROSCOPIC TUBAL LIGATION Bilateral 08/02/2018   Procedure: LAPAROSCOPIC TUBAL LIGATION WITH FILSHIE CLIPS;  Surgeon: Sloan Leiter, MD;  Location: [facility];  Service: Gynecology;  Laterality: Bilateral;    OB History    Gravida  4   Para  4   Term  3   Preterm  1   AB  0   Living  5     SAB  0   TAB  0   Ectopic  0   Multiple  1   Live Births  5            Home  Medications    Prior to Admission medications   Medication Sig Start Date End Date Taking? Authorizing Provider  ferrous sulfate 325 (65 FE) MG tablet Take 1 tablet (325 mg total) by mouth daily. 03/07/18   Lajean Manes, CNM  ibuprofen (ADVIL) 600 MG tablet Take 1 tablet (600 mg total) by mouth every 6 (six) hours. Patient not taking: Reported on 09/27/2018 06/03/18   Seabron Spates, CNM  NIFEdipine (PROCARDIA-XL/NIFEDICAL-XL) 30 MG 24 hr tablet Take 1 tablet (30 mg total) by mouth daily. Patient not taking: Reported on 09/27/2018 07/13/18   Sloan Leiter, MD  oxyCODONE-acetaminophen (PERCOCET/ROXICET) 5-325 MG tablet Take 1 tablet by mouth every 6 (six) hours as needed. Patient not taking: Reported on 09/27/2018 08/02/18   Sloan Leiter, MD  pantoprazole (PROTONIX) 20 MG tablet Take 1 tablet (20 mg total) by mouth daily. 11/06/19 11/05/20  Lavonia Drafts, MD  Prenatal Vit-Fe Fumarate-FA (PRENATAL MULTIVITAMIN) TABS tablet Take 1 tablet by mouth daily at 12 noon.    [provider]    Family History Family History  Problem Relation Age of Onset  . Hypertension Mother   . Diabetes Mother   . Hypercholesterolemia Father   . Hypertension Father   . Heart disease Maternal Grandmother   . Diabetes Maternal  Grandmother     Social History Social History   Tobacco Use  . Smoking status: Never Smoker  . Smokeless tobacco: Never Used  Vaping Use  . Vaping Use: Never used  Substance Use Topics  . Alcohol use: Not Currently  . Drug use: Never     Allergies   Ciprofloxacin and Zofran [ondansetron hcl]   Review of Systems Review of Systems PER HPI    Physical Exam Triage Vital Signs ED Triage Vitals  Enc Vitals Group     BP 01/16/20 1723 (!) 153/114     Pulse Rate 01/16/20 1723 74     Resp 01/16/20 1723 16     Temp 01/16/20 1723 98.6 F (37 C)     Temp Source 01/16/20 1723 Oral     SpO2 01/16/20 1723 100 %     Weight --      Height --      Head Circumference  --      Peak Flow --      Pain Score 01/16/20 1720 8     Pain Loc --      Pain Edu? --      Excl. in [place]? --    No data found.  Updated Vital Signs BP (!) 153/114 (BP Location: Left Arm)   Pulse 74   Temp 98.6 F (37 C) (Oral)   Resp 16   LMP 01/08/2020 (Exact Date)   SpO2 100%   Visual Acuity Right Eye Distance:   Left Eye Distance:   Bilateral Distance:    Right Eye Near:   Left Eye Near:    Bilateral Near:     Physical Exam Vitals and nursing note reviewed.  Constitutional:      Appearance: Normal appearance. She is not ill-appearing.  HENT:     Head: Atraumatic.     Mouth/Throat:     Mouth: Mucous membranes are moist.     Pharynx: Oropharynx is clear.  Eyes:     Extraocular Movements: Extraocular movements intact.     Conjunctiva/sclera: Conjunctivae normal.  Cardiovascular:     Rate and Rhythm: Normal rate and regular rhythm.     Heart sounds: Normal heart sounds.  Pulmonary:     Effort: Pulmonary effort is normal.     Breath sounds: Normal breath sounds.  Abdominal:     General: Bowel sounds are normal. There is no distension.     Palpations: Abdomen is soft.     Tenderness: There is abdominal tenderness (LLQ, pelvic). There is no right CVA tenderness, left CVA tenderness, guarding or rebound.  Genitourinary:    Comments: Declines GU exam Musculoskeletal:        General: Normal range of motion.     Cervical back: Normal range of motion and neck supple.  Skin:    General: Skin is warm and dry.  Neurological:     Mental Status: She is alert and oriented to person, place, and time.  Psychiatric:        Mood and Affect: Mood normal.        Thought Content: Thought content normal.        Judgment: Judgment normal.     UC Treatments / Results  Labs (all labs ordered are listed, but only abnormal results are displayed) Labs Reviewed  POCT URINALYSIS DIPSTICK, ED / UC - Abnormal; Notable for the following components:      Result Value   Bilirubin  Urine SMALL (*)    Ketones, ur 15 (*)  Protein, ur 30 (*)    All other components within normal limits  POC URINE PREG, ED    EKG   Radiology No results found.  Procedures Procedures (including critical care time)  Medications Ordered in UC Medications  ketorolac (TORADOL) injection 60 mg (60 mg Intramuscular Given 01/16/20 1806)    Initial Impression / Assessment and Plan / UC Course  I have reviewed the triage vital signs and the nursing notes.  Pertinent labs & imaging results that were available during my care of the patient were reviewed by me and considered in my medical decision making (see chart for details).     Hypertensive, but otherwise stable vital signs. LLQ ttp on exam, otherwise exam reassuring with no neurologic deficits, no active nosebleeds. Suspect these sxs are more allergy related, low suspicion her sxs are connected to the abdominal pain. Reviewed with patient limitations of working up her severe LLQ/pelvic pain without CT or u/s imaging in this setting. Recommended she go to ED for further evaluation. She refuses and accepting IM toradol and to be discharged home. Strict ED precautions given.   Final Clinical Impressions(s) / UC Diagnoses   Final diagnoses:  Left lower quadrant abdominal pain  Frontal headache  Nosebleed   Discharge Instructions   None    ED Prescriptions    None     PDMP not reviewed this encounter.   Volney American, [place] 01/16/20 1809

## 2020-01-16 NOTE — ED Triage Notes (Signed)
Pt presents with lower abdominal pain on left side, headaches and nose bleeds that started today.  Pt denies having irregular bowel movements and problems urinating.

## 2020-01-23 DIAGNOSIS — R1032 Left lower quadrant pain: Secondary | ICD-10-CM | POA: Diagnosis not present

## 2020-01-23 DIAGNOSIS — D649 Anemia, unspecified: Secondary | ICD-10-CM | POA: Diagnosis not present

## 2020-01-23 DIAGNOSIS — Z23 Encounter for immunization: Secondary | ICD-10-CM | POA: Diagnosis not present

## 2020-01-23 DIAGNOSIS — I10 Essential (primary) hypertension: Secondary | ICD-10-CM | POA: Diagnosis not present

## 2020-01-24 DIAGNOSIS — I10 Essential (primary) hypertension: Secondary | ICD-10-CM | POA: Diagnosis not present

## 2020-01-24 DIAGNOSIS — G4730 Sleep apnea, unspecified: Secondary | ICD-10-CM | POA: Diagnosis not present

## 2020-01-24 DIAGNOSIS — R7303 Prediabetes: Secondary | ICD-10-CM | POA: Diagnosis not present

## 2020-01-24 DIAGNOSIS — Z6841 Body Mass Index (BMI) 40.0 and over, adult: Secondary | ICD-10-CM | POA: Diagnosis not present

## 2020-02-20 DIAGNOSIS — E669 Obesity, unspecified: Secondary | ICD-10-CM | POA: Diagnosis not present

## 2020-02-20 DIAGNOSIS — I10 Essential (primary) hypertension: Secondary | ICD-10-CM | POA: Diagnosis not present

## 2020-02-20 DIAGNOSIS — E1165 Type 2 diabetes mellitus with hyperglycemia: Secondary | ICD-10-CM | POA: Diagnosis not present

## 2020-02-20 DIAGNOSIS — M545 Low back pain, unspecified: Secondary | ICD-10-CM | POA: Diagnosis not present

## 2020-02-20 DIAGNOSIS — K5900 Constipation, unspecified: Secondary | ICD-10-CM | POA: Diagnosis not present

## 2020-02-20 DIAGNOSIS — G8929 Other chronic pain: Secondary | ICD-10-CM | POA: Diagnosis not present

## 2020-04-23 DIAGNOSIS — R7303 Prediabetes: Secondary | ICD-10-CM | POA: Diagnosis not present

## 2020-04-23 DIAGNOSIS — I10 Essential (primary) hypertension: Secondary | ICD-10-CM | POA: Diagnosis not present

## 2020-04-23 DIAGNOSIS — G4730 Sleep apnea, unspecified: Secondary | ICD-10-CM | POA: Diagnosis not present

## 2020-04-23 DIAGNOSIS — Z6841 Body Mass Index (BMI) 40.0 and over, adult: Secondary | ICD-10-CM | POA: Diagnosis not present

## 2020-05-20 DIAGNOSIS — R7303 Prediabetes: Secondary | ICD-10-CM | POA: Diagnosis not present

## 2020-05-20 DIAGNOSIS — I10 Essential (primary) hypertension: Secondary | ICD-10-CM | POA: Diagnosis not present

## 2020-05-20 DIAGNOSIS — D649 Anemia, unspecified: Secondary | ICD-10-CM | POA: Diagnosis not present

## 2020-06-02 ENCOUNTER — Ambulatory Visit: Payer: Medicaid Other | Admitting: Obstetrics and Gynecology

## 2020-06-17 DIAGNOSIS — G8929 Other chronic pain: Secondary | ICD-10-CM | POA: Diagnosis not present

## 2020-06-17 DIAGNOSIS — Z0000 Encounter for general adult medical examination without abnormal findings: Secondary | ICD-10-CM | POA: Diagnosis not present

## 2020-06-17 DIAGNOSIS — I10 Essential (primary) hypertension: Secondary | ICD-10-CM | POA: Diagnosis not present

## 2020-06-17 DIAGNOSIS — M545 Low back pain, unspecified: Secondary | ICD-10-CM | POA: Diagnosis not present

## 2020-06-17 DIAGNOSIS — R7303 Prediabetes: Secondary | ICD-10-CM | POA: Diagnosis not present

## 2020-06-17 DIAGNOSIS — H53149 Visual discomfort, unspecified: Secondary | ICD-10-CM | POA: Diagnosis not present

## 2020-06-17 DIAGNOSIS — E669 Obesity, unspecified: Secondary | ICD-10-CM | POA: Diagnosis not present

## 2020-06-17 DIAGNOSIS — Z1211 Encounter for screening for malignant neoplasm of colon: Secondary | ICD-10-CM | POA: Diagnosis not present

## 2020-06-17 DIAGNOSIS — E1165 Type 2 diabetes mellitus with hyperglycemia: Secondary | ICD-10-CM | POA: Diagnosis not present

## 2020-06-18 ENCOUNTER — Ambulatory Visit: Payer: Medicaid Other | Admitting: Obstetrics

## 2020-06-18 DIAGNOSIS — Z713 Dietary counseling and surveillance: Secondary | ICD-10-CM | POA: Diagnosis not present

## 2020-07-14 ENCOUNTER — Encounter (HOSPITAL_COMMUNITY): Payer: Self-pay

## 2020-07-14 ENCOUNTER — Emergency Department (HOSPITAL_COMMUNITY): Payer: Medicaid Other

## 2020-07-14 ENCOUNTER — Emergency Department (HOSPITAL_COMMUNITY)
Admission: EM | Admit: 2020-07-14 | Discharge: 2020-07-14 | Discharge status: Against Medical Advice | Payer: Medicaid Other | Attending: Emergency Medicine | Admitting: Emergency Medicine

## 2020-07-14 DIAGNOSIS — R0602 Shortness of breath: Secondary | ICD-10-CM | POA: Diagnosis not present

## 2020-07-14 DIAGNOSIS — Z5321 Procedure and treatment not carried out due to patient leaving prior to being seen by health care provider: Secondary | ICD-10-CM | POA: Insufficient documentation

## 2020-07-14 DIAGNOSIS — R079 Chest pain, unspecified: Secondary | ICD-10-CM | POA: Insufficient documentation

## 2020-07-14 DIAGNOSIS — R110 Nausea: Secondary | ICD-10-CM | POA: Diagnosis not present

## 2020-07-14 DIAGNOSIS — R0789 Other chest pain: Secondary | ICD-10-CM | POA: Diagnosis not present

## 2020-07-14 LAB — BASIC METABOLIC PANEL
Anion gap: 4 — ABNORMAL LOW (ref 5–15)
BUN: 11 mg/dL (ref 6–20)
CO2: 26 mmol/L (ref 22–32)
Calcium: 9.1 mg/dL (ref 8.9–10.3)
Chloride: 105 mmol/L (ref 98–111)
Creatinine, Ser: 1.09 mg/dL — ABNORMAL HIGH (ref 0.44–1.00)
GFR, Estimated: >60 mL/min (ref >60)
Glucose, Bld: 95 mg/dL (ref 70–99)
Potassium: 3.7 mmol/L (ref 3.5–5.1)
Sodium: 135 mmol/L (ref 135–145)

## 2020-07-14 LAB — CBC
HCT: 34.1 % — ABNORMAL LOW (ref 36.0–46.0)
Hemoglobin: 11.1 g/dL — ABNORMAL LOW (ref 12.0–15.0)
MCH: 31.4 pg (ref 26.0–34.0)
MCHC: 32.6 g/dL (ref 30.0–36.0)
MCV: 96.3 fL (ref 80.0–100.0)
Platelets: 297 10*3/uL (ref 150–400)
RBC: 3.54 MIL/uL — ABNORMAL LOW (ref 3.87–5.11)
RDW: 14.1 % (ref 11.5–15.5)
WBC: 5.7 10*3/uL (ref 4.0–10.5)
nRBC: 0 % (ref 0.0–0.2)

## 2020-07-14 LAB — TROPONIN I (HIGH SENSITIVITY): Troponin I (High Sensitivity): 4 ng/L (ref <18)

## 2020-07-14 NOTE — ED Triage Notes (Signed)
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

## 2020-07-14 NOTE — ED Provider Notes (Signed)
Emergency Medicine Provider Triage Evaluation Note  Jennifer Booth , a 35 y.o. female  was evaluated in triage.  Pt complains of  Sudden onset chest tightness, nausea, sob, near syncope.  Review of Systems  Positive: Chest discomfort Negative: cough  Physical Exam  There were no vitals taken for this visit. Gen:   Awake, no distress   Resp:  Normal effort  MSK:   Moves extremities without difficulty  Other:    Medical Decision Making  Medically screening exam initiated at 2:25 PM.  Appropriate orders placed.  Jennifer Booth was informed that the remainder of the evaluation will be completed by another provider, this initial triage assessment does not replace that evaluation, and the importance of remaining in the ED until their evaluation is complete.  Work up started   Jennifer Mail, PA-C 07/14/20 1434    Jennifer Shanks, MD 07/22/20 2131

## 2020-07-15 DIAGNOSIS — I10 Essential (primary) hypertension: Secondary | ICD-10-CM | POA: Diagnosis not present

## 2020-07-15 DIAGNOSIS — M25562 Pain in left knee: Secondary | ICD-10-CM | POA: Diagnosis not present

## 2020-07-21 ENCOUNTER — Ambulatory Visit: Payer: Medicaid Other | Admitting: Obstetrics

## 2020-07-24 DIAGNOSIS — I10 Essential (primary) hypertension: Secondary | ICD-10-CM | POA: Diagnosis not present

## 2020-07-24 DIAGNOSIS — Z6841 Body Mass Index (BMI) 40.0 and over, adult: Secondary | ICD-10-CM | POA: Diagnosis not present

## 2020-07-24 DIAGNOSIS — R7303 Prediabetes: Secondary | ICD-10-CM | POA: Diagnosis not present

## 2020-08-11 ENCOUNTER — Emergency Department (HOSPITAL_COMMUNITY): Payer: Medicaid Other

## 2020-08-11 ENCOUNTER — Encounter (HOSPITAL_COMMUNITY): Payer: Self-pay

## 2020-08-11 ENCOUNTER — Emergency Department (HOSPITAL_COMMUNITY)
Admission: EM | Admit: 2020-08-11 | Discharge: 2020-08-11 | Disposition: A | Discharge status: Home / Self Care | Payer: Medicaid Other | Source: Home / Self Care | Attending: Emergency Medicine | Admitting: Emergency Medicine

## 2020-08-11 ENCOUNTER — Inpatient Hospital Stay (HOSPITAL_COMMUNITY)
Admission: EM | Admit: 2020-08-11 | Discharge: 2020-08-14 | DRG: 418 | Disposition: A | Discharge status: Home / Self Care | Payer: Medicaid Other | Attending: Internal Medicine | Admitting: Internal Medicine

## 2020-08-11 ENCOUNTER — Other Ambulatory Visit: Payer: Self-pay

## 2020-08-11 DIAGNOSIS — R1013 Epigastric pain: Secondary | ICD-10-CM

## 2020-08-11 DIAGNOSIS — K8020 Calculus of gallbladder without cholecystitis without obstruction: Secondary | ICD-10-CM | POA: Insufficient documentation

## 2020-08-11 DIAGNOSIS — K8010 Calculus of gallbladder with chronic cholecystitis without obstruction: Principal | ICD-10-CM | POA: Diagnosis present

## 2020-08-11 DIAGNOSIS — Z79899 Other long term (current) drug therapy: Secondary | ICD-10-CM

## 2020-08-11 DIAGNOSIS — I10 Essential (primary) hypertension: Secondary | ICD-10-CM | POA: Diagnosis present

## 2020-08-11 DIAGNOSIS — Z8249 Family history of ischemic heart disease and other diseases of the circulatory system: Secondary | ICD-10-CM

## 2020-08-11 DIAGNOSIS — Z6841 Body Mass Index (BMI) 40.0 and over, adult: Secondary | ICD-10-CM

## 2020-08-11 DIAGNOSIS — K760 Fatty (change of) liver, not elsewhere classified: Secondary | ICD-10-CM | POA: Diagnosis not present

## 2020-08-11 DIAGNOSIS — O1490 Unspecified pre-eclampsia, unspecified trimester: Secondary | ICD-10-CM | POA: Insufficient documentation

## 2020-08-11 DIAGNOSIS — E876 Hypokalemia: Secondary | ICD-10-CM | POA: Diagnosis present

## 2020-08-11 DIAGNOSIS — R112 Nausea with vomiting, unspecified: Secondary | ICD-10-CM

## 2020-08-11 DIAGNOSIS — Z20822 Contact with and (suspected) exposure to covid-19: Secondary | ICD-10-CM | POA: Diagnosis present

## 2020-08-11 DIAGNOSIS — Z881 Allergy status to other antibiotic agents status: Secondary | ICD-10-CM

## 2020-08-11 DIAGNOSIS — Z419 Encounter for procedure for purposes other than remedying health state, unspecified: Secondary | ICD-10-CM

## 2020-08-11 DIAGNOSIS — Z888 Allergy status to other drugs, medicaments and biological substances status: Secondary | ICD-10-CM

## 2020-08-11 DIAGNOSIS — K8050 Calculus of bile duct without cholangitis or cholecystitis without obstruction: Secondary | ICD-10-CM

## 2020-08-11 DIAGNOSIS — Z9049 Acquired absence of other specified parts of digestive tract: Secondary | ICD-10-CM

## 2020-08-11 LAB — CBC WITH DIFFERENTIAL/PLATELET
Abs Immature Granulocytes: 0.02 10*3/uL (ref 0.00–0.07)
Basophils Absolute: 0 10*3/uL (ref 0.0–0.1)
Basophils Relative: 0 %
Eosinophils Absolute: 0.3 10*3/uL (ref 0.0–0.5)
Eosinophils Relative: 4 %
HCT: 32.3 % — ABNORMAL LOW (ref 36.0–46.0)
Hemoglobin: 10.6 g/dL — ABNORMAL LOW (ref 12.0–15.0)
Immature Granulocytes: 0 %
Lymphocytes Relative: 49 %
Lymphs Abs: 3.9 10*3/uL (ref 0.7–4.0)
MCH: 31.5 pg (ref 26.0–34.0)
MCHC: 32.8 g/dL (ref 30.0–36.0)
MCV: 95.8 fL (ref 80.0–100.0)
Monocytes Absolute: 0.6 10*3/uL (ref 0.1–1.0)
Monocytes Relative: 8 %
Neutro Abs: 3 10*3/uL (ref 1.7–7.7)
Neutrophils Relative %: 39 %
Platelets: 330 10*3/uL (ref 150–400)
RBC: 3.37 MIL/uL — ABNORMAL LOW (ref 3.87–5.11)
RDW: 14.7 % (ref 11.5–15.5)
WBC: 7.9 10*3/uL (ref 4.0–10.5)
nRBC: 0 % (ref 0.0–0.2)

## 2020-08-11 LAB — URINALYSIS, ROUTINE W REFLEX MICROSCOPIC
Bilirubin Urine: NEGATIVE
Glucose, UA: NEGATIVE mg/dL
Hgb urine dipstick: NEGATIVE
Ketones, ur: NEGATIVE mg/dL
Leukocytes,Ua: NEGATIVE
Nitrite: NEGATIVE
Protein, ur: NEGATIVE mg/dL
Specific Gravity, Urine: 1.02 (ref 1.005–1.030)
pH: 5 (ref 5.0–8.0)

## 2020-08-11 LAB — COMPREHENSIVE METABOLIC PANEL
ALT: 19 U/L (ref 0–44)
ALT: 15 U/L (ref 0–44)
AST: 22 U/L (ref 15–41)
AST: 19 U/L (ref 15–41)
Albumin: 3.2 g/dL — ABNORMAL LOW (ref 3.5–5.0)
Albumin: 3.3 g/dL — ABNORMAL LOW (ref 3.5–5.0)
Alkaline Phosphatase: 72 U/L (ref 38–126)
Alkaline Phosphatase: 74 U/L (ref 38–126)
Anion gap: 8 (ref 5–15)
Anion gap: 8 (ref 5–15)
BUN: 12 mg/dL (ref 6–20)
BUN: 16 mg/dL (ref 6–20)
CO2: 24 mmol/L (ref 22–32)
CO2: 29 mmol/L (ref 22–32)
Calcium: 8.6 mg/dL — ABNORMAL LOW (ref 8.9–10.3)
Calcium: 8.8 mg/dL — ABNORMAL LOW (ref 8.9–10.3)
Chloride: 105 mmol/L (ref 98–111)
Chloride: 103 mmol/L (ref 98–111)
Creatinine, Ser: 1.11 mg/dL — ABNORMAL HIGH (ref 0.44–1.00)
Creatinine, Ser: 1.22 mg/dL — ABNORMAL HIGH (ref 0.44–1.00)
GFR, Estimated: >60 mL/min (ref >60)
GFR, Estimated: >60 mL/min (ref >60)
Glucose, Bld: 114 mg/dL — ABNORMAL HIGH (ref 70–99)
Glucose, Bld: 89 mg/dL (ref 70–99)
Potassium: 3.3 mmol/L — ABNORMAL LOW (ref 3.5–5.1)
Potassium: 3.5 mmol/L (ref 3.5–5.1)
Sodium: 137 mmol/L (ref 135–145)
Sodium: 140 mmol/L (ref 135–145)
Total Bilirubin: 0.7 mg/dL (ref 0.3–1.2)
Total Bilirubin: 0.2 mg/dL — ABNORMAL LOW (ref 0.3–1.2)
Total Protein: 5.9 g/dL — ABNORMAL LOW (ref 6.5–8.1)
Total Protein: 6.2 g/dL — ABNORMAL LOW (ref 6.5–8.1)

## 2020-08-11 LAB — CBC
HCT: 34.9 % — ABNORMAL LOW (ref 36.0–46.0)
Hemoglobin: 11.6 g/dL — ABNORMAL LOW (ref 12.0–15.0)
MCH: 31.5 pg (ref 26.0–34.0)
MCHC: 33.2 g/dL (ref 30.0–36.0)
MCV: 94.8 fL (ref 80.0–100.0)
Platelets: 328 10*3/uL (ref 150–400)
RBC: 3.68 MIL/uL — ABNORMAL LOW (ref 3.87–5.11)
RDW: 14.6 % (ref 11.5–15.5)
WBC: 6.3 10*3/uL (ref 4.0–10.5)
nRBC: 0 % (ref 0.0–0.2)

## 2020-08-11 LAB — LIPASE, BLOOD
Lipase: 29 U/L (ref 11–51)
Lipase: 35 U/L (ref 11–51)

## 2020-08-11 LAB — I-STAT BETA HCG BLOOD, ED (MC, WL, AP ONLY): I-stat hCG, quantitative: <5 m[IU]/mL (ref <5)

## 2020-08-11 MED ORDER — SODIUM CHLORIDE 0.9 % IV SOLN
12.5000 mg | Freq: Once | INTRAVENOUS | Status: AC
  Administered 2020-08-11: 12.5 mg via INTRAVENOUS
  Filled 2020-08-11 (×2): qty 0.5

## 2020-08-11 MED ORDER — SODIUM CHLORIDE 0.9 % IV BOLUS
1000.0000 mL | Freq: Once | INTRAVENOUS | Status: AC
  Administered 2020-08-11: 1000 mL via INTRAVENOUS

## 2020-08-11 MED ORDER — SODIUM CHLORIDE 0.9 % IV BOLUS
500.0000 mL | Freq: Once | INTRAVENOUS | Status: AC
  Administered 2020-08-11: 500 mL via INTRAVENOUS

## 2020-08-11 MED ORDER — FAMOTIDINE IN NACL 20-0.9 MG/50ML-% IV SOLN
20.0000 mg | Freq: Once | INTRAVENOUS | Status: AC
  Administered 2020-08-11: 20 mg via INTRAVENOUS
  Filled 2020-08-11: qty 50

## 2020-08-11 MED ORDER — SODIUM CHLORIDE 0.9 % IV SOLN
25.0000 mg | Freq: Once | INTRAVENOUS | Status: AC
  Administered 2020-08-11: 25 mg via INTRAVENOUS
  Filled 2020-08-11: qty 25

## 2020-08-11 MED ORDER — FENTANYL CITRATE (PF) 100 MCG/2ML IJ SOLN
50.0000 ug | Freq: Once | INTRAMUSCULAR | Status: AC
  Administered 2020-08-11: 50 ug via INTRAVENOUS
  Filled 2020-08-11: qty 2

## 2020-08-11 MED ORDER — MORPHINE SULFATE (PF) 4 MG/ML IV SOLN
4.0000 mg | Freq: Once | INTRAVENOUS | Status: AC
Start: 2020-08-11 — End: 2020-08-11
  Administered 2020-08-11: 4 mg via INTRAVENOUS
  Filled 2020-08-11: qty 1

## 2020-08-11 MED ORDER — HYDROMORPHONE HCL 1 MG/ML IJ SOLN
1.0000 mg | Freq: Once | INTRAMUSCULAR | Status: AC
  Administered 2020-08-11: 1 mg via INTRAVENOUS
  Filled 2020-08-11: qty 1

## 2020-08-11 MED ORDER — MORPHINE SULFATE (PF) 4 MG/ML IV SOLN
4.0000 mg | Freq: Once | INTRAVENOUS | Status: AC
  Administered 2020-08-11: 4 mg via INTRAVENOUS
  Filled 2020-08-11: qty 1

## 2020-08-11 MED ORDER — KETOROLAC TROMETHAMINE 30 MG/ML IJ SOLN
30.0000 mg | Freq: Once | INTRAMUSCULAR | Status: AC
  Administered 2020-08-11: 30 mg via INTRAVENOUS
  Filled 2020-08-11: qty 1

## 2020-08-11 MED ORDER — PROMETHAZINE HCL 25 MG PO TABS: 25.0000 mg | ORAL_TABLET | Freq: Four times a day (QID) | ORAL | 0 refills | Status: DC | PRN

## 2020-08-11 NOTE — ED Triage Notes (Signed)
Pt reports RUQ pain and N/V x2 days. Pt was seen at [facility] ED earlier today and was told she had gallstones. Pt reports coming back because the pain is more severe.

## 2020-08-11 NOTE — ED Provider Notes (Signed)
[facility] DEPT Provider Note   CSN: [id number] Arrival date & time: 08/11/20  2157     History Chief Complaint  Patient presents with   Abdominal Pain    Jennifer Booth is a 35 y.o. female.  She has a history of hypertension and anemia.  She had 2 days of right upper quadrant pain nausea and vomiting.  She went to [facility] earlier today and had labs that were fairly unremarkable and a right upper quadrant ultrasound that showed cholelithiasis but not cholecystitis.  Her symptoms were controlled with morphine and Phenergan.  She was discharged with Phenergan and told to take Tylenol and ibuprofen for pain and follow-up outpatient with general surgery.  Patient states the pain returned and she has been vomiting ever since.  No prior history of same.  No fevers or chills.  The history is provided by the patient.  Abdominal Pain Pain location:  RUQ Pain quality: stabbing   Pain radiates to:  Back Pain severity:  Severe Onset quality:  Sudden Duration:  1 hour Timing:  Constant Progression:  Unchanged Chronicity:  Recurrent Context: not trauma   Relieved by:  Nothing Worsened by:  Nothing Ineffective treatments:  None tried Associated symptoms: nausea and vomiting   Associated symptoms: no chest pain, no cough, no diarrhea, no dysuria, no fever, no shortness of breath and no sore throat       Past Medical History:  Diagnosis Date   Anemia    Pre-eclampsia 2018    Patient Active Problem List   Diagnosis Date Noted   ASCUS with positive high risk HPV cervical 07/13/2018   Anemia of pregnancy 06/02/2018   Mild pre-eclampsia 06/01/2018   VBAC, delivered 06/01/2018   H/O pre-eclampsia in prior pregnancy, currently pregnant 05/03/2018   Unwanted fertility 05/03/2018   History of C-section 04/24/2018   Late prenatal care affecting pregnancy in third trimester 04/19/2018   History of preterm delivery 04/19/2018   History of twin pregnancy in  prior pregnancy 04/19/2018   Supervision of other normal pregnancy, antepartum 04/18/2018    Past Surgical History:  Procedure Laterality Date   CESAREAN SECTION     LAPAROSCOPIC TUBAL LIGATION Bilateral 08/02/2018   Procedure: LAPAROSCOPIC TUBAL LIGATION WITH FILSHIE CLIPS;  Surgeon: Sloan Leiter, MD;  Location: [facility];  Service: Gynecology;  Laterality: Bilateral;     OB History     Gravida  4   Para  4   Term  3   Preterm  1   AB  0   Living  5      SAB  0   IAB  0   Ectopic  0   Multiple  1   Live Births  5           Family History  Problem Relation Age of Onset   Hypertension Mother    Diabetes Mother    Hypercholesterolemia Father    Hypertension Father    Heart disease Maternal Grandmother    Diabetes Maternal Grandmother     Social History   Tobacco Use   Smoking status: Never   Smokeless tobacco: Never  Vaping Use   Vaping Use: Never used  Substance Use Topics   Alcohol use: Not Currently   Drug use: Never    Home Medications Prior to Admission medications   Medication Sig Start Date End Date Taking? Authorizing Provider  ferrous sulfate 325 (65 FE) MG tablet Take 1 tablet (325 mg total)  by mouth daily. 03/07/18   Lajean Manes, CNM  ibuprofen (ADVIL) 600 MG tablet Take 1 tablet (600 mg total) by mouth every 6 (six) hours. Patient not taking: Reported on 09/27/2018 06/03/18   Seabron Spates, CNM  NIFEdipine (PROCARDIA-XL/NIFEDICAL-XL) 30 MG 24 hr tablet Take 1 tablet (30 mg total) by mouth daily. Patient not taking: Reported on 09/27/2018 07/13/18   Sloan Leiter, MD  oxyCODONE-acetaminophen (PERCOCET/ROXICET) 5-325 MG tablet Take 1 tablet by mouth every 6 (six) hours as needed. Patient not taking: Reported on 09/27/2018 08/02/18   Sloan Leiter, MD  pantoprazole (PROTONIX) 20 MG tablet Take 1 tablet (20 mg total) by mouth daily. 11/06/19 11/05/20  Lavonia Drafts, MD  Prenatal Vit-Fe Fumarate-FA (PRENATAL  MULTIVITAMIN) TABS tablet Take 1 tablet by mouth daily at 12 noon.    [provider]  promethazine (PHENERGAN) 25 MG tablet Take 1 tablet (25 mg total) by mouth every 6 (six) hours as needed for nausea or vomiting. 08/11/20   Barrie Folk, PA-C    Allergies    Ciprofloxacin and Zofran [ondansetron hcl]  Review of Systems   Review of Systems  Constitutional:  Negative for fever.  HENT:  Negative for sore throat.   Eyes:  Negative for visual disturbance.  Respiratory:  Negative for cough and shortness of breath.   Cardiovascular:  Negative for chest pain.  Gastrointestinal:  Positive for abdominal pain, nausea and vomiting. Negative for diarrhea.  Genitourinary:  Negative for dysuria.  Musculoskeletal:  Positive for back pain.  Skin:  Negative for rash.  Neurological:  Negative for headaches.   Physical Exam Updated Vital Signs BP (!) 144/104 (BP Location: Left Wrist)   Pulse 83   Temp 98.3 F (36.8 C) (Oral)   Resp 16   Ht 5\' 3"  (1.6 m)   Wt 122.5 kg   SpO2 99%   BMI 47.83 kg/m   Physical Exam Vitals and nursing note reviewed.  Constitutional:      General: She is not in acute distress.    Appearance: Normal appearance. She is well-developed.  HENT:     Head: Normocephalic and atraumatic.  Eyes:     Conjunctiva/sclera: Conjunctivae normal.  Cardiovascular:     Rate and Rhythm: Normal rate and regular rhythm.     Heart sounds: No murmur heard. Pulmonary:     Effort: Pulmonary effort is normal. No respiratory distress.     Breath sounds: Normal breath sounds.  Abdominal:     Palpations: Abdomen is soft.     Tenderness: There is abdominal tenderness in the right upper quadrant. There is no guarding or rebound.  Musculoskeletal:        General: No deformity or signs of injury. Normal range of motion.     Cervical back: Neck supple.  Skin:    General: Skin is warm and dry.  Neurological:     General: No focal deficit present.     Mental Status:  She is alert.    ED Results / Procedures / Treatments   Labs (all labs ordered are listed, but only abnormal results are displayed) Labs Reviewed  CBC WITH DIFFERENTIAL/PLATELET - Abnormal; Notable for the following components:      Result Value   RBC 3.37 (*)    Hemoglobin 10.6 (*)    HCT 32.3 (*)    All other components within normal limits  COMPREHENSIVE METABOLIC PANEL - Abnormal; Notable for the following components:   Creatinine, Ser 1.22 (*)  Calcium 8.8 (*)    Total Protein 6.2 (*)    Albumin 3.3 (*)    Total Bilirubin 0.2 (*)    All other components within normal limits  LIPASE, BLOOD    EKG None  Radiology US Abdomen Limited RUQ (LIVER/GB)  Result Date: 08/11/2020 CLINICAL DATA:  Epigastric pain and nausea and vomiting for 2 days. EXAM: ULTRASOUND ABDOMEN LIMITED RIGHT UPPER QUADRANT COMPARISON:  None. FINDINGS: Gallbladder: Distended, but with a thin wall and no pericholecystic fluid or sonographic Murphy's sign. There are dependent stones, largest measuring 1 cm. Common bile duct: Diameter: 3 mm Liver: Coarse, heterogeneous echotexture with diffusely increased echogenicity. No mass or focal lesion. Normal size. Portal vein is patent on color Doppler imaging with normal direction of blood flow towards the liver. Other: None. IMPRESSION: 1. No acute findings. 2. Cholelithiasis without evidence of acute cholecystitis. 3. Hepatic steatosis. Electronically Signed   By: Lajean Manes M.D.   On: 08/11/2020 10:22    Procedures Procedures   Medications Ordered in ED Medications  famotidine (PEPCID) IVPB 20 mg premix (20 mg Intravenous New Bag/Given 08/11/20 2349)  ketorolac (TORADOL) 30 MG/ML injection 30 mg (30 mg Intravenous Given 08/11/20 2238)  morphine 4 MG/ML injection 4 mg (4 mg Intravenous Given 08/11/20 2239)  promethazine (PHENERGAN) 25 mg in sodium chloride 0.9 % 50 mL IVPB (0 mg Intravenous Stopped 08/11/20 2254)  sodium chloride 0.9 % bolus 500 mL (0 mLs Intravenous  Stopped 08/11/20 2352)  HYDROmorphone (DILAUDID) injection 1 mg (1 mg Intravenous Given 08/11/20 2348)    ED Course  I have reviewed the triage vital signs and the nursing notes.  Pertinent labs & imaging results that were available during my care of the patient were reviewed by me and considered in my medical decision making (see chart for details).  Clinical Course as of 08/11/20 2357  Mon Aug 11, 2020  2246 Discussed with Dr. Dema Severin general surgery.  He said with normal labs he would not admit her to the hospital for an elective surgery. [MB]  2246 Reassessed patient after initial pain medications.  She says there is no change in her pain. [MB]    Clinical Course User Index [MB] Hayden Rasmussen, MD   MDM Rules/Calculators/A&P                         This patient complains of upper abdominal pain nausea vomiting; this involves an extensive number of treatment Options and is a complaint that carries with it a high risk of complications and Morbidity. The differential includes biliary colic, cholecystitis, peptic ulcer disease  I ordered, reviewed and interpreted labs, which included a white count, hemoglobin low stable from priors, chemistries with mild elevation in creatinine, fairly unremarkable LFTs, normal lipase I ordered medication IV fluids, IV pain medication and nausea medication, IV Pepcid Previous records obtained and reviewed in epic including prior ED visit today. I consulted Dr. Dema Severin general surgery and discussed lab and imaging findings  Critical Interventions: None  After the interventions stated above, I reevaluated the patient and found patient still to be in significant discomfort.  Her care is signed out to oncoming provider to reassess after next dose of pain medication.  If can p.o. trial and pain is controlled he appropriate for outpatient follow-up with general surgery.  If not may be needs a medical admission   Final Clinical Impression(s) / ED  Diagnoses Final diagnoses:  Biliary colic    Rx /  DC Orders ED Discharge Orders     None        Hayden Rasmussen, MD 08/11/20 2359

## 2020-08-11 NOTE — ED Provider Notes (Signed)
[facility] EMERGENCY DEPARTMENT Provider Note   CSN: [id number] Arrival date & time: 08/11/20  9678     History Chief Complaint  Patient presents with   Abdominal Pain    Jennifer Booth is a 35 y.o. female with anemia and pre-eclampsia. Abdominal surgical history includes cesarean section and laparoscopic tubal ligation.   HPI  Patient presents to emergency department today with chief complaint of epigastirc pain and vomiting x 2 days. Pain is constant and waxes and wanes in severity. Pain is a burning sensation and doe snot radiate. She tried to drink hot tea and take tylenol with minimal pain improvement. Rates pain currently 8/10 in severity. She admits associated chills, nausea, emesis, and diarrhea. She has had 4 episode of NBNB emesis  and 1 episode of green loose stool in the last 24 hours. LMP was last month. She denies alcohol consumption or drug use. Denies any recent ravel or antibiotic use. No sick contacts or suspicious food intake.  States she has been eating healthy recently, has not had any fatty or fried foods.  Denies fever, chest pain, urinary symptoms, pelvic pain, abnormal vaginal bleeding, vaginal discharge, back pain.     Past Medical History:  Diagnosis Date   Anemia    Pre-eclampsia 2018    Patient Active Problem List   Diagnosis Date Noted   ASCUS with positive high risk HPV cervical 07/13/2018   Anemia of pregnancy 06/02/2018   Mild pre-eclampsia 06/01/2018   VBAC, delivered 06/01/2018   H/O pre-eclampsia in prior pregnancy, currently pregnant 05/03/2018   Unwanted fertility 05/03/2018   History of C-section 04/24/2018   Late prenatal care affecting pregnancy in third trimester 04/19/2018   History of preterm delivery 04/19/2018   History of twin pregnancy in prior pregnancy 04/19/2018   Supervision of other normal pregnancy, antepartum 04/18/2018    Past Surgical History:  Procedure Laterality Date   CESAREAN SECTION      LAPAROSCOPIC TUBAL LIGATION Bilateral 08/02/2018   Procedure: LAPAROSCOPIC TUBAL LIGATION WITH FILSHIE CLIPS;  Surgeon: Sloan Leiter, MD;  Location: [facility];  Service: Gynecology;  Laterality: Bilateral;     OB History     Gravida  4   Para  4   Term  3   Preterm  1   AB  0   Living  5      SAB  0   IAB  0   Ectopic  0   Multiple  1   Live Births  5           Family History  Problem Relation Age of Onset   Hypertension Mother    Diabetes Mother    Hypercholesterolemia Father    Hypertension Father    Heart disease Maternal Grandmother    Diabetes Maternal Grandmother     Social History   Tobacco Use   Smoking status: Never   Smokeless tobacco: Never  Vaping Use   Vaping Use: Never used  Substance Use Topics   Alcohol use: Not Currently   Drug use: Never    Home Medications Prior to Admission medications   Medication Sig Start Date End Date Taking? Authorizing Provider  promethazine (PHENERGAN) 25 MG tablet Take 1 tablet (25 mg total) by mouth every 6 (six) hours as needed for nausea or vomiting. 08/11/20  Yes Walisiewicz, Mylo Choi E, PA-C  ferrous sulfate 325 (65 FE) MG tablet Take 1 tablet (325 mg total) by mouth daily. 03/07/18   Stann Mainland,  Katheran James, CNM  ibuprofen (ADVIL) 600 MG tablet Take 1 tablet (600 mg total) by mouth every 6 (six) hours. Patient not taking: Reported on 09/27/2018 06/03/18   Seabron Spates, CNM  NIFEdipine (PROCARDIA-XL/NIFEDICAL-XL) 30 MG 24 hr tablet Take 1 tablet (30 mg total) by mouth daily. Patient not taking: Reported on 09/27/2018 07/13/18   Sloan Leiter, MD  oxyCODONE-acetaminophen (PERCOCET/ROXICET) 5-325 MG tablet Take 1 tablet by mouth every 6 (six) hours as needed. Patient not taking: Reported on 09/27/2018 08/02/18   Sloan Leiter, MD  pantoprazole (PROTONIX) 20 MG tablet Take 1 tablet (20 mg total) by mouth daily. 11/06/19 11/05/20  Lavonia Drafts, MD  Prenatal Vit-Fe Fumarate-FA (PRENATAL  MULTIVITAMIN) TABS tablet Take 1 tablet by mouth daily at 12 noon.    [provider]    Allergies    Ciprofloxacin and Zofran [ondansetron hcl]  Review of Systems   Review of Systems All other systems are reviewed and are negative for acute change except as noted in the HPI.  Physical Exam Updated Vital Signs BP (!) 154/103 (BP Location: Left Arm)   Pulse 84   Temp 97.9 F (36.6 C) (Oral)   Resp 14   SpO2 100%   Physical Exam Vitals and nursing note reviewed.  Constitutional:      General: She is not in acute distress.    Appearance: She is not ill-appearing.  HENT:     Head: Normocephalic and atraumatic.     Right Ear: Tympanic membrane and external ear normal.     Left Ear: Tympanic membrane and external ear normal.     Nose: Nose normal.     Mouth/Throat:     Mouth: Mucous membranes are moist.     Pharynx: Oropharynx is clear.  Eyes:     General: No scleral icterus.       Right eye: No discharge.        Left eye: No discharge.     Extraocular Movements: Extraocular movements intact.     Conjunctiva/sclera: Conjunctivae normal.     Pupils: Pupils are equal, round, and reactive to light.  Neck:     Vascular: No JVD.  Cardiovascular:     Rate and Rhythm: Normal rate and regular rhythm.     Pulses: Normal pulses.          Radial pulses are 2+ on the right side and 2+ on the left side.     Heart sounds: Normal heart sounds.  Pulmonary:     Comments: Lungs clear to auscultation in all fields. Symmetric chest rise. No wheezing, rales, or rhonchi. Abdominal:     Tenderness: There is abdominal tenderness in the right upper quadrant and epigastric area.     Comments: Abdomen is soft, non-distended. No rigidity, no guarding. No peritoneal signs.  Musculoskeletal:        General: Normal range of motion.     Cervical back: Normal range of motion.  Skin:    General: Skin is warm and dry.     Capillary Refill: Capillary refill takes less than 2 seconds.   Neurological:     Mental Status: She is oriented to person, place, and time.     GCS: GCS eye subscore is 4. GCS verbal subscore is 5. GCS motor subscore is 6.     Comments: Fluent speech, no facial droop.  Psychiatric:        Behavior: Behavior normal.    ED Results / Procedures / Treatments  Labs (all labs ordered are listed, but only abnormal results are displayed) Labs Reviewed  COMPREHENSIVE METABOLIC PANEL - Abnormal; Notable for the following components:      Result Value   Potassium 3.3 (*)    Glucose, Bld 114 (*)    Creatinine, Ser 1.11 (*)    Calcium 8.6 (*)    Total Protein 5.9 (*)    Albumin 3.2 (*)    All other components within normal limits  CBC - Abnormal; Notable for the following components:   RBC 3.68 (*)    Hemoglobin 11.6 (*)    HCT 34.9 (*)    All other components within normal limits  LIPASE, BLOOD  URINALYSIS, ROUTINE W REFLEX MICROSCOPIC  I-STAT BETA HCG BLOOD, ED ([facility], [facility], [facility] ONLY)    EKG None  Radiology US Abdomen Limited RUQ (LIVER/GB)  Result Date: 08/11/2020 CLINICAL DATA:  Epigastric pain and nausea and vomiting for 2 days. EXAM: ULTRASOUND ABDOMEN LIMITED RIGHT UPPER QUADRANT COMPARISON:  None. FINDINGS: Gallbladder: Distended, but with a thin wall and no pericholecystic fluid or sonographic Murphy's sign. There are dependent stones, largest measuring 1 cm. Common bile duct: Diameter: 3 mm Liver: Coarse, heterogeneous echotexture with diffusely increased echogenicity. No mass or focal lesion. Normal size. Portal vein is patent on color Doppler imaging with normal direction of blood flow towards the liver. Other: None. IMPRESSION: 1. No acute findings. 2. Cholelithiasis without evidence of acute cholecystitis. 3. Hepatic steatosis. Electronically Signed   By: Lajean Manes M.D.   On: 08/11/2020 10:22    Procedures Procedures   Medications Ordered in ED Medications  morphine 4 MG/ML injection 4 mg (4 mg Intravenous Given 08/11/20 1023)   promethazine (PHENERGAN) 12.5 mg in sodium chloride 0.9 % 50 mL IVPB (0 mg Intravenous Stopped 08/11/20 1047)  sodium chloride 0.9 % bolus 1,000 mL (1,000 mLs Intravenous New Bag/Given 08/11/20 1020)  fentaNYL (SUBLIMAZE) injection 50 mcg (50 mcg Intravenous Given 08/11/20 1148)    ED Course  I have reviewed the triage vital signs and the nursing notes.  Pertinent labs & imaging results that were available during my care of the patient were reviewed by me and considered in my medical decision making (see chart for details).    MDM Rules/Calculators/A&P                          History provided by patient with additional history obtained from chart review.    Patient presents to the ED with complaints of abdominal pain. Patient nontoxic appearing, in no apparent distress. BP slightly elevated in triage 154/103 although cuff is on lower arm 2/2 body habitus. On exam patient tender to RUQ and epigastric areas, no peritoneal signs. Will evaluate with labs and [place]. Analgesics, anti-emetics, and fluids administered.   Labs reviewed and grossly unremarkable. No leukocytosis, EMEA consistent with baseline.  CMP also shows creatinine consistent with baseline, mild hypokalemia 3.3, no other significant electrolyte derangement, no renal insufficiency.  Lipase within normal range.  Urinalysis without obvious infection. Pregnancy test is negative.  Imaging shows cholelithiasis without evidence of acute cholecystitis.  Updated patient on results.  Pain has improved after medications.  She is tolerating p.o. intake.  Patient will need to follow-up outpatient with surgery clinic.  No indications for hospital admission at this time.  Prescription for Phenergan sent to the pharmacy as she has allergy to Zofran with reaction of anaphylaxis.  Serial abdominal exams without peritoneal signs.I discussed return  precautions with the patient. Provided opportunity for questions, patient confirmed understanding and is in  agreement with plan.   Portions of this note were generated with [employer]. Dictation errors may occur despite best attempts at proofreading.  Final Clinical Impression(s) / ED Diagnoses Final diagnoses:  Epigastric abdominal pain  Calculus of gallbladder without cholecystitis without obstruction    Rx / DC Orders ED Discharge Orders          Ordered    promethazine (PHENERGAN) 25 MG tablet  Every 6 hours PRN        08/11/20 1221             Barrie Folk, PA-C 08/11/20 1223    Varney Biles, MD 08/12/20 [phone]

## 2020-08-11 NOTE — Discharge Instructions (Addendum)
Your ultrasound shows you have stones in your gall bladder. Thankfully there are no signs of infection.  If you continue to have pain you will need to follow up with the surgery clinic to talk about having your gall bladder removed. Call their office to schedule follow up appointment.  Try to avoid eating fatty and fried foods as it can make your pain worse.  -Prescription sent to the pharmacy for Phenergan.  This is to treat your nausea.  Take as prescribed if needed.  It can make you drowsy so do not take it if you are going to be driving or working.

## 2020-08-11 NOTE — ED Notes (Signed)
Patient transported to Ultrasound 

## 2020-08-11 NOTE — ED Notes (Signed)
Pt d/c home per MD order , discharge summary reviewed with pt, pt verbalizes understanding. Reports visitor is discharge ride home. Ambulatory off unit. No s/s of acute distress noted at discharge.

## 2020-08-11 NOTE — Discharge Instructions (Addendum)
[facility] LAPAROSCOPIC SURGERY: POST OP INSTRUCTIONS Always review your discharge instruction sheet given to you by the facility where your surgery was performed. IF YOU HAVE DISABILITY OR FAMILY LEAVE FORMS, YOU MUST BRING THEM TO THE OFFICE FOR PROCESSING.   DO NOT GIVE THEM TO YOUR DOCTOR.  PAIN CONTROL  First take acetaminophen (Tylenol) AND/or ibuprofen (Advil) to control your pain after surgery.  Follow directions on package.  Taking acetaminophen (Tylenol) and/or ibuprofen (Advil) regularly after surgery will help to control your pain and lower the amount of prescription pain medication you may need.  You should not take more than 3,000 mg (3 grams) of acetaminophen (Tylenol) in 24 hours.  You should not take ibuprofen (Advil), aleve, motrin, naprosyn or other NSAIDS if you have a history of stomach ulcers or chronic kidney disease.  A prescription for pain medication may be given to you upon discharge.  Take your pain medication as prescribed, if you still have uncontrolled pain after taking acetaminophen (Tylenol) or ibuprofen (Advil). Use ice packs to help control pain. If you need a refill on your pain medication, please contact your pharmacy.  They will contact our office to request authorization. Prescriptions will not be filled after 5pm or on week-ends.  HOME MEDICATIONS Take your usually prescribed medications unless otherwise directed.  DIET You should follow a light diet the first few days after arrival home.  Be sure to include lots of fluids daily. Avoid fatty, fried foods.   CONSTIPATION It is common to experience some constipation after surgery and if you are taking pain medication.  Increasing fluid intake and taking a stool softener (such as Colace) will usually help or prevent this problem from occurring.  A mild laxative (Milk of Magnesia or Miralax) should be taken according to package instructions if there are no bowel movements after 48  hours.  WOUND/INCISION CARE Most patients will experience some swelling and bruising in the area of the incisions.  Ice packs will help.  Swelling and bruising can take several days to resolve.  Unless discharge instructions indicate otherwise, follow guidelines below  STERI-STRIPS - you may remove your outer bandages 48 hours after surgery, and you may shower at that time.  You have steri-strips (small skin tapes) in place directly over the incision.  These strips should be left on the skin for 7-10 days.   DERMABOND/SKIN GLUE - you may shower in 24 hours.  The glue will flake off over the next 2-3 weeks. Any sutures or staples will be removed at the office during your follow-up visit.  ACTIVITIES You may resume regular (light) daily activities beginning the next day--such as daily self-care, walking, climbing stairs--gradually increasing activities as tolerated.  You may have sexual intercourse when it is comfortable.  Refrain from any heavy lifting or straining until approved by your doctor. You may drive when you are no longer taking prescription pain medication, you can comfortably wear a seatbelt, and you can safely maneuver your car and apply brakes.  FOLLOW-UP You should see your doctor in the office for a follow-up appointment approximately 2-3 weeks after your surgery.  You should have been given your post-op/follow-up appointment when your surgery was scheduled.  If you did not receive a post-op/follow-up appointment, make sure that you call for this appointment within a day or two after you arrive home to insure a convenient appointment time.   WHEN TO CALL YOUR DOCTOR: Fever over 101.0 Inability to urinate Continued bleeding from incision.   Increased pain, redness, or drainage from the incision. Increasing abdominal pain  The clinic staff is available to answer your questions during regular business hours.  Please don't hesitate to call and ask to speak to one of the nurses for  clinical concerns.  If you have a medical emergency, go to the nearest emergency room or call 911.  A surgeon from [facility] is always on call at the hospital. [address], [address], [place], [place]  [address] ? [address] [address], [place],    [address] [phone] ? [phone] ? FAX [phone] Web site: [email or web address]      Managing Your Pain After Surgery Without Opioids    Thank you for participating in our program to help patients manage their pain after surgery without opioids. This is part of our effort to provide you with the best care possible, without exposing you or your family to the risk that opioids pose.  What pain can I expect after surgery? You can expect to have some pain after surgery. This is normal. The pain is typically worse the day after surgery, and quickly begins to get better. Many studies have found that many patients are able to manage their pain after surgery with Over-the-Counter (OTC) medications such as Tylenol and Motrin. If you have a condition that does not allow you to take Tylenol or Motrin, notify your surgical team.  How will I manage my pain? The best strategy for controlling your pain after surgery is around the clock pain control with Tylenol (acetaminophen) and Motrin (ibuprofen or Advil). Alternating these medications with each other allows you to maximize your pain control. In addition to Tylenol and Motrin, you can use heating pads or ice packs on your incisions to help reduce your pain.  How will I alternate your regular strength over-the-counter pain medication? You will take a dose of pain medication every three hours. Start by taking 650 mg of Tylenol (2 pills of 325 mg) 3 hours later take 600 mg of Motrin (3 pills of 200 mg) 3 hours after taking the Motrin take 650 mg of Tylenol 3 hours after that take 600 mg of Motrin.   - 1 -  See example - if your first dose of Tylenol is at 12:00  PM   12:00 PM Tylenol 650 mg (2 pills of 325 mg)  3:00 PM Motrin 600 mg (3 pills of 200 mg)  6:00 PM Tylenol 650 mg (2 pills of 325 mg)  9:00 PM Motrin 600 mg (3 pills of 200 mg)  Continue alternating every 3 hours   We recommend that you follow this schedule around-the-clock for at least 3 days after surgery, or until you feel that it is no longer needed. Use the table on the last page of this handout to keep track of the medications you are taking. Important: Do not take more than 3000mg of Tylenol or 3200mg of Motrin in a 24-hour period. Do not take ibuprofen/Motrin if you have a history of bleeding stomach ulcers, severe kidney disease, &/or actively taking a blood thinner  What if I still have pain? If you have pain that is not controlled with the over-the-counter pain medications (Tylenol and Motrin or Advil) you might have what we call "breakthrough" pain. You will receive a prescription for a small amount of an opioid pain medication such as Oxycodone, Tramadol, or Tylenol with Codeine. Use these opioid pills in the first 24 hours after surgery if you have breakthrough pain. Do   not take more than 1 pill every 4-6 hours.  If you still have uncontrolled pain after using all opioid pills, don't hesitate to call our staff using the number provided. We will help make sure you are managing your pain in the best way possible, and if necessary, we can provide a prescription for additional pain medication.   Day 1    Time  Name of Medication Number of pills taken  Amount of Acetaminophen  Pain Level   Comments  AM PM       AM PM       AM PM       AM PM       AM PM       AM PM       AM PM       AM PM       Total Daily amount of Acetaminophen Do not take more than  3,000 mg per day      Day 2    Time  Name of Medication Number of pills taken  Amount of Acetaminophen  Pain Level   Comments  AM PM       AM PM       AM PM       AM PM       AM PM       AM PM       AM  PM       AM PM       Total Daily amount of Acetaminophen Do not take more than  3,000 mg per day      Day 3    Time  Name of Medication Number of pills taken  Amount of Acetaminophen  Pain Level   Comments  AM PM       AM PM       AM PM       AM PM          AM PM       AM PM       AM PM       AM PM       Total Daily amount of Acetaminophen Do not take more than  3,000 mg per day      Day 4    Time  Name of Medication Number of pills taken  Amount of Acetaminophen  Pain Level   Comments  AM PM       AM PM       AM PM       AM PM       AM PM       AM PM       AM PM       AM PM       Total Daily amount of Acetaminophen Do not take more than  3,000 mg per day      Day 5    Time  Name of Medication Number of pills taken  Amount of Acetaminophen  Pain Level   Comments  AM PM       AM PM       AM PM       AM PM       AM PM       AM PM       AM PM       AM PM       Total Daily amount of Acetaminophen Do not take more than    3,000 mg per day       Day 6    Time  Name of Medication Number of pills taken  Amount of Acetaminophen  Pain Level  Comments  AM PM       AM PM       AM PM       AM PM       AM PM       AM PM       AM PM       AM PM       Total Daily amount of Acetaminophen Do not take more than  3,000 mg per day      Day 7    Time  Name of Medication Number of pills taken  Amount of Acetaminophen  Pain Level   Comments  AM PM       AM PM       AM PM       AM PM       AM PM       AM PM       AM PM       AM PM       Total Daily amount of Acetaminophen Do not take more than  3,000 mg per day        For additional information about how and where to safely dispose of unused opioid medications - https://www.morepowerful[employer].org  Disclaimer: This document contains information and/or instructional materials adapted from [facility] for the typical patient with your condition. It does not replace medical advice  from your health care provider because your experience may differ from that of the typical patient. Talk to your health care provider if you have any questions about this document, your condition or your treatment plan. Adapted from [facility]  

## 2020-08-11 NOTE — ED Triage Notes (Signed)
Patient complains of epigastric pain with vomiting x 2 days. States that everything she eats comes back up. Patient denies diarrhea. Alert and oriented, NAD

## 2020-08-12 ENCOUNTER — Encounter (HOSPITAL_COMMUNITY): Payer: Self-pay | Admitting: Family Medicine

## 2020-08-12 ENCOUNTER — Observation Stay (HOSPITAL_COMMUNITY): Payer: Medicaid Other

## 2020-08-12 DIAGNOSIS — K8050 Calculus of bile duct without cholangitis or cholecystitis without obstruction: Secondary | ICD-10-CM | POA: Diagnosis not present

## 2020-08-12 DIAGNOSIS — Z79899 Other long term (current) drug therapy: Secondary | ICD-10-CM | POA: Diagnosis not present

## 2020-08-12 DIAGNOSIS — Z20822 Contact with and (suspected) exposure to covid-19: Secondary | ICD-10-CM | POA: Diagnosis not present

## 2020-08-12 DIAGNOSIS — Z888 Allergy status to other drugs, medicaments and biological substances status: Secondary | ICD-10-CM | POA: Diagnosis not present

## 2020-08-12 DIAGNOSIS — I10 Essential (primary) hypertension: Secondary | ICD-10-CM | POA: Diagnosis not present

## 2020-08-12 DIAGNOSIS — E876 Hypokalemia: Secondary | ICD-10-CM | POA: Diagnosis not present

## 2020-08-12 DIAGNOSIS — K8010 Calculus of gallbladder with chronic cholecystitis without obstruction: Secondary | ICD-10-CM | POA: Diagnosis not present

## 2020-08-12 DIAGNOSIS — R112 Nausea with vomiting, unspecified: Secondary | ICD-10-CM | POA: Diagnosis not present

## 2020-08-12 DIAGNOSIS — K760 Fatty (change of) liver, not elsewhere classified: Secondary | ICD-10-CM | POA: Diagnosis not present

## 2020-08-12 DIAGNOSIS — K8020 Calculus of gallbladder without cholecystitis without obstruction: Secondary | ICD-10-CM | POA: Diagnosis not present

## 2020-08-12 DIAGNOSIS — R1013 Epigastric pain: Secondary | ICD-10-CM | POA: Diagnosis not present

## 2020-08-12 DIAGNOSIS — K8066 Calculus of gallbladder and bile duct with acute and chronic cholecystitis without obstruction: Secondary | ICD-10-CM | POA: Diagnosis not present

## 2020-08-12 DIAGNOSIS — Z6841 Body Mass Index (BMI) 40.0 and over, adult: Secondary | ICD-10-CM | POA: Diagnosis not present

## 2020-08-12 DIAGNOSIS — Z8249 Family history of ischemic heart disease and other diseases of the circulatory system: Secondary | ICD-10-CM | POA: Diagnosis not present

## 2020-08-12 DIAGNOSIS — Z881 Allergy status to other antibiotic agents status: Secondary | ICD-10-CM | POA: Diagnosis not present

## 2020-08-12 LAB — RESP PANEL BY RT-PCR (FLU A&B, COVID) ARPGX2
Influenza A by PCR: NEGATIVE
Influenza B by PCR: NEGATIVE
SARS Coronavirus 2 by RT PCR: NEGATIVE

## 2020-08-12 LAB — BASIC METABOLIC PANEL
Anion gap: 8 (ref 5–15)
BUN: 12 mg/dL (ref 6–20)
CO2: 28 mmol/L (ref 22–32)
Calcium: 8.4 mg/dL — ABNORMAL LOW (ref 8.9–10.3)
Chloride: 104 mmol/L (ref 98–111)
Creatinine, Ser: 1.05 mg/dL — ABNORMAL HIGH (ref 0.44–1.00)
GFR, Estimated: >60 mL/min (ref >60)
Glucose, Bld: 85 mg/dL (ref 70–99)
Potassium: 3.8 mmol/L (ref 3.5–5.1)
Sodium: 140 mmol/L (ref 135–145)

## 2020-08-12 LAB — CBC
HCT: 34 % — ABNORMAL LOW (ref 36.0–46.0)
Hemoglobin: 11.1 g/dL — ABNORMAL LOW (ref 12.0–15.0)
MCH: 31.6 pg (ref 26.0–34.0)
MCHC: 32.6 g/dL (ref 30.0–36.0)
MCV: 96.9 fL (ref 80.0–100.0)
Platelets: 326 10*3/uL (ref 150–400)
RBC: 3.51 MIL/uL — ABNORMAL LOW (ref 3.87–5.11)
RDW: 15 % (ref 11.5–15.5)
WBC: 7.1 10*3/uL (ref 4.0–10.5)
nRBC: 0 % (ref 0.0–0.2)

## 2020-08-12 LAB — CREATININE, SERUM
Creatinine, Ser: 1.08 mg/dL — ABNORMAL HIGH (ref 0.44–1.00)
GFR, Estimated: >60 mL/min (ref >60)

## 2020-08-12 LAB — HIV ANTIBODY (ROUTINE TESTING W REFLEX): HIV Screen 4th Generation wRfx: NONREACTIVE

## 2020-08-12 MED ORDER — FENTANYL CITRATE (PF) 100 MCG/2ML IJ SOLN
100.0000 ug | Freq: Once | INTRAMUSCULAR | Status: AC
  Administered 2020-08-12: 100 ug via INTRAVENOUS
  Filled 2020-08-12: qty 2

## 2020-08-12 MED ORDER — TECHNETIUM TC 99M MEBROFENIN IV KIT
5.5000 | PACK | Freq: Once | INTRAVENOUS | Status: AC | PRN
  Administered 2020-08-12: 5.5 via INTRAVENOUS

## 2020-08-12 MED ORDER — SODIUM CHLORIDE 0.9 % IV SOLN
12.5000 mg | Freq: Four times a day (QID) | INTRAVENOUS | Status: DC | PRN
  Filled 2020-08-12: qty 0.5

## 2020-08-12 MED ORDER — SODIUM CHLORIDE 0.9 % IV SOLN: INTRAVENOUS | Status: DC

## 2020-08-12 MED ORDER — DICYCLOMINE HCL 10 MG PO CAPS
10.0000 mg | ORAL_CAPSULE | Freq: Once | ORAL | Status: AC
  Administered 2020-08-12: 10 mg via ORAL
  Filled 2020-08-12: qty 1

## 2020-08-12 MED ORDER — DIPHENHYDRAMINE HCL 50 MG/ML IJ SOLN
12.5000 mg | Freq: Four times a day (QID) | INTRAMUSCULAR | Status: DC | PRN
  Administered 2020-08-12 – 2020-08-13 (×3): 12.5 mg via INTRAVENOUS
  Filled 2020-08-12 (×3): qty 1

## 2020-08-12 MED ORDER — AMLODIPINE BESYLATE 10 MG PO TABS
10.0000 mg | ORAL_TABLET | Freq: Every day | ORAL | Status: DC
  Administered 2020-08-12: 10 mg via ORAL
  Filled 2020-08-12: qty 1

## 2020-08-12 MED ORDER — ACETAMINOPHEN 325 MG PO TABS
650.0000 mg | ORAL_TABLET | Freq: Four times a day (QID) | ORAL | Status: DC | PRN
  Administered 2020-08-12 (×2): 650 mg via ORAL
  Filled 2020-08-12 (×2): qty 2

## 2020-08-12 MED ORDER — ENOXAPARIN SODIUM 60 MG/0.6ML IJ SOSY
60.0000 mg | PREFILLED_SYRINGE | INTRAMUSCULAR | Status: DC
  Administered 2020-08-12: 60 mg via SUBCUTANEOUS
  Filled 2020-08-12 (×3): qty 0.6

## 2020-08-12 MED ORDER — HYDROMORPHONE HCL 1 MG/ML IJ SOLN
1.0000 mg | INTRAMUSCULAR | Status: AC | PRN
  Administered 2020-08-12 – 2020-08-13 (×4): 1 mg via INTRAVENOUS
  Filled 2020-08-12 (×4): qty 1

## 2020-08-12 MED ORDER — SODIUM CHLORIDE 0.9 % IV SOLN
2.0000 g | INTRAVENOUS | Status: DC
  Administered 2020-08-12: 2 g via INTRAVENOUS
  Filled 2020-08-12 (×3): qty 20

## 2020-08-12 MED ORDER — ACETAMINOPHEN 650 MG RE SUPP: 650.0000 mg | Freq: Four times a day (QID) | RECTAL | Status: DC | PRN

## 2020-08-12 MED ORDER — MORPHINE SULFATE (PF) 2 MG/ML IV SOLN
1.0000 mg | Freq: Once | INTRAVENOUS | Status: AC
  Administered 2020-08-12: 1 mg via INTRAVENOUS
  Filled 2020-08-12: qty 1

## 2020-08-12 MED ORDER — HYDROCHLOROTHIAZIDE 12.5 MG PO CAPS
12.5000 mg | ORAL_CAPSULE | Freq: Every morning | ORAL | Status: DC
  Administered 2020-08-12: 12.5 mg via ORAL
  Filled 2020-08-12: qty 1

## 2020-08-12 MED ORDER — LACTATED RINGERS IV SOLN: INTRAVENOUS | Status: DC

## 2020-08-12 NOTE — ED Provider Notes (Signed)
35 year old female received in signout from Dr. Melina Copa, pending pain medication and reevaluation.  Per his HPI:  "Jennifer Booth is a 35 y.o. female.  She has a history of hypertension and anemia.  She had 2 days of right upper quadrant pain nausea and vomiting.  She went to [facility] earlier today and had labs that were fairly unremarkable and a right upper quadrant ultrasound that showed cholelithiasis but not cholecystitis.  Her symptoms were controlled with morphine and Phenergan.  She was discharged with Phenergan and told to take Tylenol and ibuprofen for pain and follow-up outpatient with general surgery.  Patient states the pain returned and she has been vomiting ever since.  No prior history of same.  No fevers or chills."   Physical Exam  BP 122/80   Pulse 78   Temp 98.3 F (36.8 C) (Oral)   Resp 16   Ht 5\' 3"  (1.6 m)   Wt 122.5 kg   SpO2 100%   BMI 47.83 kg/m   Physical Exam Vitals and nursing note reviewed.  Constitutional:      General: She is not in acute distress.    Appearance: She is obese. She is not ill-appearing, toxic-appearing or diaphoretic.  HENT:     Head: Normocephalic.  Eyes:     Conjunctiva/sclera: Conjunctivae normal.  Cardiovascular:     Rate and Rhythm: Normal rate and regular rhythm.     Heart sounds: No murmur heard.   No friction rub. No gallop.  Pulmonary:     Effort: Pulmonary effort is normal. No respiratory distress.  Abdominal:     General: There is no distension.     Palpations: Abdomen is soft. There is no mass.     Tenderness: There is abdominal tenderness. There is no right CVA tenderness, left CVA tenderness, guarding or rebound.     Hernia: No hernia is present.     Comments: Tender to palpation of the right upper quadrant.  Negative Murphy sign.  No rebound or guarding.  No CVA tenderness bilaterally.  No tenderness over McBurney's point.  Abdomen is soft and nondistended.  Musculoskeletal:     Cervical back: Neck supple.   Skin:    General: Skin is warm.     Findings: No rash.  Neurological:     Mental Status: She is alert.  Psychiatric:        Behavior: Behavior normal.    ED Course/Procedures   Clinical Course as of 08/12/20 0208  Mon Aug 11, 2020  2246 Discussed with Dr. Dema Severin general surgery.  He said with normal labs he would not admit her to the hospital for an elective surgery. [MB]  2246 Reassessed patient after initial pain medications.  She says there is no change in her pain. [MB]    Clinical Course User Index [MB] Hayden Rasmussen, MD    Procedures  MDM   35 year old female received at signout from Dr. Melina Copa pending reevaluation after pain medication.  Please see his note for further work-up and medical decision making.  In brief, this is a patient who was seen for the second time in the ER today for likely biliary colic.  Transaminases and bilirubin are normal.  She had a right upper quadrant ultrasound that demonstrated cholelithiasis without evidence of acute cholecystitis.  She has been treated with multiple pain and antiemetic medications in the emergency department and continues to endorse severe pain.  General surgery was consulted by Dr. Melina Copa.  He spoke with Dr. Dema Severin  who was not agreeable to admitting the patient for elective cholecystectomy and if patient's pain was not well controlled she would require medical admission.  Given no improvement in her pain, she will require medical admission.  Discussed the patient with Dr. Tonie Griffith, hospitalist, who will accept the patient for admission. The patient appears reasonably stabilized for admission considering the current resources, flow, and capabilities available in the ED at this time, and I doubt any other [facility] requiring further screening and/or treatment in the ED prior to admission.        Joline Maxcy A, PA-C 08/12/20 0211    Orpah Greek, MD 08/12/20 [id number]

## 2020-08-12 NOTE — H&P (View-Only) (Signed)
Consult Note  Jennifer Booth [birth year]  [id number].    Requesting MD: Dessa Phi Chief Complaint/Reason for Consult: Acute cholecystitis  HPI:  Patient is a 35 year old female who presented today to [facility] with persistent RUQ abdominal pain and n/v. Patient was seen for the same yesterday at [facility] and RUQ [place] was equivocal and patient was without any lab abnormalities. She was discharged from ED but came back when symptoms persisted. She reports symptoms started 2 days ago. Pain is in RUQ and radiating around R flank, sharp in nature and constant. She denies fever, chills, chest pain, SOB, urinary symptoms. PMH otherwise significant for HTN and class III obesity. Past abdominal surgery includes cesarean section and laparoscopic tubal ligation. No blood thinning medications. Patient allergic to cipro and zofran.   ROS: Review of Systems  Constitutional:  Negative for chills and fever.  Respiratory:  Negative for shortness of breath and wheezing.   Cardiovascular:  Negative for chest pain and palpitations.  Gastrointestinal:  Positive for abdominal pain, nausea and vomiting. Negative for diarrhea.  Genitourinary:  Negative for dysuria, frequency and urgency.  All other systems reviewed and are negative.  Family History  Problem Relation Age of Onset   Hypertension Mother    Diabetes Mother    Hypercholesterolemia Father    Hypertension Father    Heart disease Maternal Grandmother    Diabetes Maternal Grandmother     Past Medical History:  Diagnosis Date   Anemia    Pre-eclampsia 2018    Past Surgical History:  Procedure Laterality Date   CESAREAN SECTION     LAPAROSCOPIC TUBAL LIGATION Bilateral 08/02/2018   Procedure: LAPAROSCOPIC TUBAL LIGATION WITH FILSHIE CLIPS;  Surgeon: Sloan Leiter, MD;  Location: [place];  Service: Gynecology;  Laterality: Bilateral;    Social History:  reports that she has never smoked. She has never used smokeless  tobacco. She reports previous alcohol use. She reports that she does not use drugs.  Allergies:  Allergies  Allergen Reactions   Ciprofloxacin Anaphylaxis   Zofran [Ondansetron Hcl] Anaphylaxis    Medications Prior to Admission  Medication Sig Dispense Refill   amLODipine (NORVASC) 10 MG tablet Take 10 mg by mouth daily.     hydrochlorothiazide (HYDRODIURIL) 12.5 MG tablet Take 12.5 mg by mouth every morning.     naproxen (NAPROSYN) 500 MG tablet Take 500 mg by mouth 2 (two) times daily as needed.     promethazine (PHENERGAN) 25 MG tablet Take 1 tablet (25 mg total) by mouth every 6 (six) hours as needed for nausea or vomiting. 12 tablet 0    Blood pressure (!) 135/94, pulse 82, temperature 98.8 F (37.1 C), temperature source Oral, resp. rate 18, height 5\' 3"  (1.6 m), weight 122.5 kg, SpO2 100 %, currently breastfeeding. Physical Exam:  General: pleasant, WD, obese female who is laying in bed in NAD HEENT: head is normocephalic, atraumatic.  Sclera are anicteric.  PERRL.  Ears and nose without any masses or lesions.  Mouth is pink and moist Heart: regular, rate, and rhythm.  Normal s1,s2. No obvious murmurs, gallops, or rubs noted.  Palpable radial and pedal pulses bilaterally Lungs: CTAB, no wheezes, rhonchi, or rales noted.  Respiratory effort nonlabored Abd: soft, ttp in RUQ with positive murphy sign, ND, +BS, no masses, hernias, or organomegaly MS: all 4 extremities are symmetrical with no cyanosis, clubbing, or edema. Skin: warm and dry with no masses, lesions, or rashes Neuro: Cranial nerves  2-12 grossly intact, sensation is normal throughout Psych: A&Ox3 with an appropriate affect.   Results for orders placed or performed during the hospital encounter of 08/11/20 (from the past 48 hour(s))  CBC with Differential     Status: Abnormal   Collection Time: 08/11/20 11:00 PM  Result Value Ref Range   WBC 7.9 4.0 - 10.5 K/uL   RBC 3.37 (L) 3.87 - 5.11 MIL/uL   Hemoglobin 10.6  (L) 12.0 - 15.0 g/dL   HCT 32.3 (L) 36.0 - 46.0 %   MCV 95.8 80.0 - 100.0 fL   MCH 31.5 26.0 - 34.0 pg   MCHC 32.8 30.0 - 36.0 g/dL   RDW 14.7 11.5 - 15.5 %   Platelets 330 150 - 400 K/uL   nRBC 0.0 0.0 - 0.2 %   Neutrophils Relative % 39 %   Neutro Abs 3.0 1.7 - 7.7 K/uL   Lymphocytes Relative 49 %   Lymphs Abs 3.9 0.7 - 4.0 K/uL   Monocytes Relative 8 %   Monocytes Absolute 0.6 0.1 - 1.0 K/uL   Eosinophils Relative 4 %   Eosinophils Absolute 0.3 0.0 - 0.5 K/uL   Basophils Relative 0 %   Basophils Absolute 0.0 0.0 - 0.1 K/uL   Immature Granulocytes 0 %   Abs Immature Granulocytes 0.02 0.00 - 0.07 K/uL    Comment: Performed at [facility], [place] [address] [place], [place] [address]  Comprehensive metabolic panel     Status: Abnormal   Collection Time: 08/11/20 11:00 PM  Result Value Ref Range   Sodium 140 135 - 145 mmol/L   Potassium 3.5 3.5 - 5.1 mmol/L   Chloride 103 98 - 111 mmol/L   CO2 29 22 - 32 mmol/L   Glucose, Bld 89 70 - 99 mg/dL    Comment: Glucose reference range applies only to samples taken after fasting for at least 8 hours.   BUN 16 6 - 20 mg/dL   Creatinine, Ser 1.22 (H) 0.44 - 1.00 mg/dL   Calcium 8.8 (L) 8.9 - 10.3 mg/dL   Total Protein 6.2 (L) 6.5 - 8.1 g/dL   Albumin 3.3 (L) 3.5 - 5.0 g/dL   AST 19 15 - 41 U/L   ALT 15 0 - 44 U/L   Alkaline Phosphatase 74 38 - 126 U/L   Total Bilirubin 0.2 (L) 0.3 - 1.2 mg/dL   GFR, Estimated >60 >60 mL/min    Comment: (NOTE) Calculated using the CKD-EPI Creatinine Equation (2021)    Anion gap 8 5 - 15    Comment: Performed at [facility], [place] [address], [place], [place] [address]  Lipase, blood     Status: None   Collection Time: 08/11/20 11:00 PM  Result Value Ref Range   Lipase 35 11 - 51 U/L    Comment: Performed at [facility], [place] [address], [place], [place] [address]  Resp Panel by RT-PCR (Flu A&B, Covid) Nasopharyngeal Swab     Status: None    Collection Time: 08/12/20  1:11 AM   Specimen: Nasopharyngeal Swab; Nasopharyngeal(NP) swabs in vial transport medium  Result Value Ref Range   SARS Coronavirus 2 by RT PCR NEGATIVE NEGATIVE    Comment: (NOTE) SARS-CoV-2 target nucleic acids are NOT DETECTED.  The SARS-CoV-2 RNA is generally detectable in upper respiratory specimens during the acute phase of infection. The lowest concentration of SARS-CoV-2 viral copies this assay can detect is 138 copies/mL. A negative result does not  preclude SARS-Cov-2 infection and should not be used as the sole basis for treatment or other patient management decisions. A negative result may occur with  improper specimen collection/handling, submission of specimen other than nasopharyngeal swab, presence of viral mutation(s) within the areas targeted by this assay, and inadequate number of viral copies(<138 copies/mL). A negative result must be combined with clinical observations, patient history, and epidemiological information. The expected result is Negative.  Fact Sheet for Patients:  EntrepreneurPulse.com.au  Fact Sheet for Healthcare Providers:  IncredibleEmployment.be  This test is no t yet approved or cleared by the [place] FDA and  has been authorized for detection and/or diagnosis of SARS-CoV-2 by FDA under an Emergency Use Authorization (EUA). This EUA will remain  in effect (meaning this test can be used) for the duration of the COVID-19 declaration under Section 564(b)(1) of the Act, 21 U.S.C.section 360bbb-3(b)(1), unless the authorization is terminated  or revoked sooner.       Influenza A by PCR NEGATIVE NEGATIVE   Influenza B by PCR NEGATIVE NEGATIVE    Comment: (NOTE) The Xpert Xpress SARS-CoV-2/FLU/RSV plus assay is intended as an aid in the diagnosis of influenza from Nasopharyngeal swab specimens and should not be used as a sole basis for treatment. Nasal washings  and aspirates are unacceptable for Xpert Xpress SARS-CoV-2/FLU/RSV testing.  Fact Sheet for Patients: EntrepreneurPulse.com.au  Fact Sheet for Healthcare Providers: IncredibleEmployment.be  This test is not yet approved or cleared by the [place] FDA and has been authorized for detection and/or diagnosis of SARS-CoV-2 by FDA under an Emergency Use Authorization (EUA). This EUA will remain in effect (meaning this test can be used) for the duration of the COVID-19 declaration under Section 564(b)(1) of the Act, 21 U.S.C. section 360bbb-3(b)(1), unless the authorization is terminated or revoked.  Performed at [facility], [place] [address], [place], [place] [address]   HIV Antibody (routine testing w rflx)     Status: None   Collection Time: 08/12/20  6:00 AM  Result Value Ref Range   HIV Screen 4th Generation wRfx Non Reactive Non Reactive    Comment: Performed at [facility], [place] [address] [place], [place] [address]  CBC     Status: Abnormal   Collection Time: 08/12/20  6:00 AM  Result Value Ref Range   WBC 7.1 4.0 - 10.5 K/uL   RBC 3.51 (L) 3.87 - 5.11 MIL/uL   Hemoglobin 11.1 (L) 12.0 - 15.0 g/dL   HCT 34.0 (L) 36.0 - 46.0 %   MCV 96.9 80.0 - 100.0 fL   MCH 31.6 26.0 - 34.0 pg   MCHC 32.6 30.0 - 36.0 g/dL   RDW 15.0 11.5 - 15.5 %   Platelets 326 150 - 400 K/uL   nRBC 0.0 0.0 - 0.2 %    Comment: Performed at [facility], [place] [address] [place], [place] [address]  Creatinine, serum     Status: Abnormal   Collection Time: 08/12/20  6:00 AM  Result Value Ref Range   Creatinine, Ser 1.08 (H) 0.44 - 1.00 mg/dL   GFR, Estimated >60 >60 mL/min    Comment: (NOTE) Calculated using the CKD-EPI Creatinine Equation (2021) Performed at [facility], [place] [address], [place], [place] [address]   Basic metabolic panel     Status: Abnormal   Collection Time: 08/12/20  6:00 AM   Result Value Ref Range   Sodium 140 135 - 145 mmol/L   Potassium 3.8  3.5 - 5.1 mmol/L   Chloride 104 98 - 111 mmol/L   CO2 28 22 - 32 mmol/L   Glucose, Bld 85 70 - 99 mg/dL    Comment: Glucose reference range applies only to samples taken after fasting for at least 8 hours.   BUN 12 6 - 20 mg/dL   Creatinine, Ser 1.05 (H) 0.44 - 1.00 mg/dL   Calcium 8.4 (L) 8.9 - 10.3 mg/dL   GFR, Estimated >60 >60 mL/min    Comment: (NOTE) Calculated using the CKD-EPI Creatinine Equation (2021)    Anion gap 8 5 - 15    Comment: Performed at [facility], [place] [address] [place], [place] [address]   NM Hepatobiliary Liver Func  Result Date: 08/12/2020 CLINICAL DATA:  Recurrent biliary colic, cholelithiasis EXAM: NUCLEAR MEDICINE HEPATOBILIARY IMAGING TECHNIQUE: Sequential images of the abdomen were obtained out to 60 minutes following intravenous administration of radiopharmaceutical. RADIOPHARMACEUTICALS:  5.5 mCi Tc-60m  Choletec IV COMPARISON:  None. FINDINGS: Prompt uptake and biliary excretion of activity by the liver is seen. Biliary activity passes into small bowel, consistent with patent common bile duct. The gallbladder is not visualized through 60 minutes. IMPRESSION: 1.  The common bile duct is patent. 2. The gallbladder is not visualized through 60 minutes, concerning for cystic duct obstruction and acute cholecystitis. Note that due to technologist error, the patient was not imaged through 120 minutes to definitively prove cystic duct obstruction. Technical repeat examination could be performed if desired based on equivocal clinical presentation. Electronically Signed   By: Eddie Candle M.D.   On: 08/12/2020 14:29   US Abdomen Limited RUQ (LIVER/GB)  Result Date: 08/11/2020 CLINICAL DATA:  Epigastric pain and nausea and vomiting for 2 days. EXAM: ULTRASOUND ABDOMEN LIMITED RIGHT UPPER QUADRANT COMPARISON:  None. FINDINGS: Gallbladder: Distended, but with a thin wall and no  pericholecystic fluid or sonographic Murphy's sign. There are dependent stones, largest measuring 1 cm. Common bile duct: Diameter: 3 mm Liver: Coarse, heterogeneous echotexture with diffusely increased echogenicity. No mass or focal lesion. Normal size. Portal vein is patent on color Doppler imaging with normal direction of blood flow towards the liver. Other: None. IMPRESSION: 1. No acute findings. 2. Cholelithiasis without evidence of acute cholecystitis. 3. Hepatic steatosis. Electronically Signed   By: Lajean Manes M.D.   On: 08/11/2020 10:22      Assessment/Plan Acute cholecystitis  - RUQ equivocal and no leukocytosis  - HIDA today positive - repeat labs in AM - start IV rocephin and will plan lap chole tomorrow   FEN: HH diet, NPO after MN VTE: SCDs ID: rocephin 7/5>>  HTN Class III Obesity - BMI 47.83  Norm Parcel, PA-C [facility] 08/12/2020, 4:06 PM Please see Amion for pager number during day hours 7:00am-4:30pm

## 2020-08-12 NOTE — Plan of Care (Signed)
  Problem: Education: Goal: Knowledge of General Education information will improve Description Including pain rating scale, medication(s)/side effects and non-pharmacologic comfort measures Outcome: Progressing   

## 2020-08-12 NOTE — Consult Note (Signed)
Consult Note  Jennifer Booth 11-08-1985  [id number].    Requesting MD: Dessa Phi Chief Complaint/Reason for Consult: Acute cholecystitis  HPI:  Patient is a 35 year old female who presented today to [facility] with persistent RUQ abdominal pain and n/v. Patient was seen for the same yesterday at [facility] and RUQ [place] was equivocal and patient was without any lab abnormalities. She was discharged from ED but came back when symptoms persisted. She reports symptoms started 2 days ago. Pain is in RUQ and radiating around R flank, sharp in nature and constant. She denies fever, chills, chest pain, SOB, urinary symptoms. PMH otherwise significant for HTN and class III obesity. Past abdominal surgery includes cesarean section and laparoscopic tubal ligation. No blood thinning medications. Patient allergic to cipro and zofran.   ROS: Review of Systems  Constitutional:  Negative for chills and fever.  Respiratory:  Negative for shortness of breath and wheezing.   Cardiovascular:  Negative for chest pain and palpitations.  Gastrointestinal:  Positive for abdominal pain, nausea and vomiting. Negative for diarrhea.  Genitourinary:  Negative for dysuria, frequency and urgency.  All other systems reviewed and are negative.  Family History  Problem Relation Age of Onset   Hypertension Mother    Diabetes Mother    Hypercholesterolemia Father    Hypertension Father    Heart disease Maternal Grandmother    Diabetes Maternal Grandmother     Past Medical History:  Diagnosis Date   Anemia    Pre-eclampsia 2018    Past Surgical History:  Procedure Laterality Date   CESAREAN SECTION     LAPAROSCOPIC TUBAL LIGATION Bilateral 08/02/2018   Procedure: LAPAROSCOPIC TUBAL LIGATION WITH FILSHIE CLIPS;  Surgeon: Sloan Leiter, MD;  Location: [place];  Service: Gynecology;  Laterality: Bilateral;    Social History:  reports that she has never smoked. She has never used smokeless  tobacco. She reports previous alcohol use. She reports that she does not use drugs.  Allergies:  Allergies  Allergen Reactions   Ciprofloxacin Anaphylaxis   Zofran [Ondansetron Hcl] Anaphylaxis    Medications Prior to Admission  Medication Sig Dispense Refill   amLODipine (NORVASC) 10 MG tablet Take 10 mg by mouth daily.     hydrochlorothiazide (HYDRODIURIL) 12.5 MG tablet Take 12.5 mg by mouth every morning.     naproxen (NAPROSYN) 500 MG tablet Take 500 mg by mouth 2 (two) times daily as needed.     promethazine (PHENERGAN) 25 MG tablet Take 1 tablet (25 mg total) by mouth every 6 (six) hours as needed for nausea or vomiting. 12 tablet 0    Blood pressure (!) 135/94, pulse 82, temperature 98.8 F (37.1 C), temperature source Oral, resp. rate 18, height 5\' 3"  (1.6 m), weight 122.5 kg, SpO2 100 %, currently breastfeeding. Physical Exam:  General: pleasant, WD, obese female who is laying in bed in NAD HEENT: head is normocephalic, atraumatic.  Sclera are anicteric.  PERRL.  Ears and nose without any masses or lesions.  Mouth is pink and moist Heart: regular, rate, and rhythm.  Normal s1,s2. No obvious murmurs, gallops, or rubs noted.  Palpable radial and pedal pulses bilaterally Lungs: CTAB, no wheezes, rhonchi, or rales noted.  Respiratory effort nonlabored Abd: soft, ttp in RUQ with positive murphy sign, ND, +BS, no masses, hernias, or organomegaly MS: all 4 extremities are symmetrical with no cyanosis, clubbing, or edema. Skin: warm and dry with no masses, lesions, or rashes Neuro: Cranial nerves  2-12 grossly intact, sensation is normal throughout Psych: A&Ox3 with an appropriate affect.   Results for orders placed or performed during the hospital encounter of 08/11/20 (from the past 48 hour(s))  CBC with Differential     Status: Abnormal   Collection Time: 08/11/20 11:00 PM  Result Value Ref Range   WBC 7.9 4.0 - 10.5 K/uL   RBC 3.37 (L) 3.87 - 5.11 MIL/uL   Hemoglobin 10.6  (L) 12.0 - 15.0 g/dL   HCT 32.3 (L) 36.0 - 46.0 %   MCV 95.8 80.0 - 100.0 fL   MCH 31.5 26.0 - 34.0 pg   MCHC 32.8 30.0 - 36.0 g/dL   RDW 14.7 11.5 - 15.5 %   Platelets 330 150 - 400 K/uL   nRBC 0.0 0.0 - 0.2 %   Neutrophils Relative % 39 %   Neutro Abs 3.0 1.7 - 7.7 K/uL   Lymphocytes Relative 49 %   Lymphs Abs 3.9 0.7 - 4.0 K/uL   Monocytes Relative 8 %   Monocytes Absolute 0.6 0.1 - 1.0 K/uL   Eosinophils Relative 4 %   Eosinophils Absolute 0.3 0.0 - 0.5 K/uL   Basophils Relative 0 %   Basophils Absolute 0.0 0.0 - 0.1 K/uL   Immature Granulocytes 0 %   Abs Immature Granulocytes 0.02 0.00 - 0.07 K/uL    Comment: Performed at [facility], [place] [address] [place], [place] [address]  Comprehensive metabolic panel     Status: Abnormal   Collection Time: 08/11/20 11:00 PM  Result Value Ref Range   Sodium 140 135 - 145 mmol/L   Potassium 3.5 3.5 - 5.1 mmol/L   Chloride 103 98 - 111 mmol/L   CO2 29 22 - 32 mmol/L   Glucose, Bld 89 70 - 99 mg/dL    Comment: Glucose reference range applies only to samples taken after fasting for at least 8 hours.   BUN 16 6 - 20 mg/dL   Creatinine, Ser 1.22 (H) 0.44 - 1.00 mg/dL   Calcium 8.8 (L) 8.9 - 10.3 mg/dL   Total Protein 6.2 (L) 6.5 - 8.1 g/dL   Albumin 3.3 (L) 3.5 - 5.0 g/dL   AST 19 15 - 41 U/L   ALT 15 0 - 44 U/L   Alkaline Phosphatase 74 38 - 126 U/L   Total Bilirubin 0.2 (L) 0.3 - 1.2 mg/dL   GFR, Estimated >60 >60 mL/min    Comment: (NOTE) Calculated using the CKD-EPI Creatinine Equation (2021)    Anion gap 8 5 - 15    Comment: Performed at [facility], [place] [address], [place], [place] [address]  Lipase, blood     Status: None   Collection Time: 08/11/20 11:00 PM  Result Value Ref Range   Lipase 35 11 - 51 U/L    Comment: Performed at [facility], [place] [address], [place], [place] [address]  Resp Panel by RT-PCR (Flu A&B, Covid) Nasopharyngeal Swab     Status: None    Collection Time: 08/12/20  1:11 AM   Specimen: Nasopharyngeal Swab; Nasopharyngeal(NP) swabs in vial transport medium  Result Value Ref Range   SARS Coronavirus 2 by RT PCR NEGATIVE NEGATIVE    Comment: (NOTE) SARS-CoV-2 target nucleic acids are NOT DETECTED.  The SARS-CoV-2 RNA is generally detectable in upper respiratory specimens during the acute phase of infection. The lowest concentration of SARS-CoV-2 viral copies this assay can detect is 138 copies/mL. A negative result does not  preclude SARS-Cov-2 infection and should not be used as the sole basis for treatment or other patient management decisions. A negative result may occur with  improper specimen collection/handling, submission of specimen other than nasopharyngeal swab, presence of viral mutation(s) within the areas targeted by this assay, and inadequate number of viral copies(<138 copies/mL). A negative result must be combined with clinical observations, patient history, and epidemiological information. The expected result is Negative.  Fact Sheet for Patients:  EntrepreneurPulse.com.au  Fact Sheet for Healthcare Providers:  IncredibleEmployment.be  This test is no t yet approved or cleared by the [place] FDA and  has been authorized for detection and/or diagnosis of SARS-CoV-2 by FDA under an Emergency Use Authorization (EUA). This EUA will remain  in effect (meaning this test can be used) for the duration of the COVID-19 declaration under Section 564(b)(1) of the Act, 21 U.S.C.section 360bbb-3(b)(1), unless the authorization is terminated  or revoked sooner.       Influenza A by PCR NEGATIVE NEGATIVE   Influenza B by PCR NEGATIVE NEGATIVE    Comment: (NOTE) The Xpert Xpress SARS-CoV-2/FLU/RSV plus assay is intended as an aid in the diagnosis of influenza from Nasopharyngeal swab specimens and should not be used as a sole basis for treatment. Nasal washings  and aspirates are unacceptable for Xpert Xpress SARS-CoV-2/FLU/RSV testing.  Fact Sheet for Patients: EntrepreneurPulse.com.au  Fact Sheet for Healthcare Providers: IncredibleEmployment.be  This test is not yet approved or cleared by the [place] FDA and has been authorized for detection and/or diagnosis of SARS-CoV-2 by FDA under an Emergency Use Authorization (EUA). This EUA will remain in effect (meaning this test can be used) for the duration of the COVID-19 declaration under Section 564(b)(1) of the Act, 21 U.S.C. section 360bbb-3(b)(1), unless the authorization is terminated or revoked.  Performed at [facility], [place] [address] [place], [place] [address]   HIV Antibody (routine testing w rflx)     Status: None   Collection Time: 08/12/20  6:00 AM  Result Value Ref Range   HIV Screen 4th Generation wRfx Non Reactive Non Reactive    Comment: Performed at [facility], [place] [address], [place], [place] [address]  CBC     Status: Abnormal   Collection Time: 08/12/20  6:00 AM  Result Value Ref Range   WBC 7.1 4.0 - 10.5 K/uL   RBC 3.51 (L) 3.87 - 5.11 MIL/uL   Hemoglobin 11.1 (L) 12.0 - 15.0 g/dL   HCT 34.0 (L) 36.0 - 46.0 %   MCV 96.9 80.0 - 100.0 fL   MCH 31.6 26.0 - 34.0 pg   MCHC 32.6 30.0 - 36.0 g/dL   RDW 15.0 11.5 - 15.5 %   Platelets 326 150 - 400 K/uL   nRBC 0.0 0.0 - 0.2 %    Comment: Performed at [facility], [place] [address] [place], [place] [address]  Creatinine, serum     Status: Abnormal   Collection Time: 08/12/20  6:00 AM  Result Value Ref Range   Creatinine, Ser 1.08 (H) 0.44 - 1.00 mg/dL   GFR, Estimated >60 >60 mL/min    Comment: (NOTE) Calculated using the CKD-EPI Creatinine Equation (2021) Performed at [facility], [place] [address] [place], [place] [address]   Basic metabolic panel     Status: Abnormal   Collection Time: 08/12/20  6:00 AM   Result Value Ref Range   Sodium 140 135 - 145 mmol/L   Potassium 3.8  3.5 - 5.1 mmol/L   Chloride 104 98 - 111 mmol/L   CO2 28 22 - 32 mmol/L   Glucose, Bld 85 70 - 99 mg/dL    Comment: Glucose reference range applies only to samples taken after fasting for at least 8 hours.   BUN 12 6 - 20 mg/dL   Creatinine, Ser 1.05 (H) 0.44 - 1.00 mg/dL   Calcium 8.4 (L) 8.9 - 10.3 mg/dL   GFR, Estimated >60 >60 mL/min    Comment: (NOTE) Calculated using the CKD-EPI Creatinine Equation (2021)    Anion gap 8 5 - 15    Comment: Performed at [facility], [place] [address], [place], [place] [address]   NM Hepatobiliary Liver Func  Result Date: 08/12/2020 CLINICAL DATA:  Recurrent biliary colic, cholelithiasis EXAM: NUCLEAR MEDICINE HEPATOBILIARY IMAGING TECHNIQUE: Sequential images of the abdomen were obtained out to 60 minutes following intravenous administration of radiopharmaceutical. RADIOPHARMACEUTICALS:  5.5 mCi Tc-26m  Choletec IV COMPARISON:  None. FINDINGS: Prompt uptake and biliary excretion of activity by the liver is seen. Biliary activity passes into small bowel, consistent with patent common bile duct. The gallbladder is not visualized through 60 minutes. IMPRESSION: 1.  The common bile duct is patent. 2. The gallbladder is not visualized through 60 minutes, concerning for cystic duct obstruction and acute cholecystitis. Note that due to technologist error, the patient was not imaged through 120 minutes to definitively prove cystic duct obstruction. Technical repeat examination could be performed if desired based on equivocal clinical presentation. Electronically Signed   By: Eddie Candle M.D.   On: 08/12/2020 14:29   US Abdomen Limited RUQ (LIVER/GB)  Result Date: 08/11/2020 CLINICAL DATA:  Epigastric pain and nausea and vomiting for 2 days. EXAM: ULTRASOUND ABDOMEN LIMITED RIGHT UPPER QUADRANT COMPARISON:  None. FINDINGS: Gallbladder: Distended, but with a thin wall and no  pericholecystic fluid or sonographic Murphy's sign. There are dependent stones, largest measuring 1 cm. Common bile duct: Diameter: 3 mm Liver: Coarse, heterogeneous echotexture with diffusely increased echogenicity. No mass or focal lesion. Normal size. Portal vein is patent on color Doppler imaging with normal direction of blood flow towards the liver. Other: None. IMPRESSION: 1. No acute findings. 2. Cholelithiasis without evidence of acute cholecystitis. 3. Hepatic steatosis. Electronically Signed   By: Lajean Manes M.D.   On: 08/11/2020 10:22      Assessment/Plan Acute cholecystitis  - RUQ equivocal and no leukocytosis  - HIDA today positive - repeat labs in AM - start IV rocephin and will plan lap chole tomorrow   FEN: HH diet, NPO after MN VTE: SCDs ID: [email or web address] 7/5>>  HTN Class III Obesity - BMI 47.83  Norm Parcel, PA-C [facility] 08/12/2020, 4:06 PM Please see Amion for pager number during day hours 7:00am-4:30pm

## 2020-08-12 NOTE — Progress Notes (Addendum)
  PROGRESS NOTE  Patient admitted earlier this morning. See H&P.   Patient admitted with chief complaint of nausea, vomiting, right upper quadrant abdominal pain.  Right upper quadrant ultrasound revealed cholelithiasis but no acute cholecystitis.  She continues to have symptoms, return to the emergency department.  Pending HIDA scan, may need formal general surgery consultation (case discussed by EDP with Dr. Dema Severin)  Continue pain medication, antiemetic  Addendum: HIDA findings with concern for cystic duct obstruction and acute cholecystitis. General surgery consulted today.  NPO.   Status is: Observation  The patient will require care spanning > 2 midnights and should be moved to inpatient because: Ongoing active pain requiring inpatient pain management  Dispo: The patient is from: Home              Anticipated d/c is to: Home              Patient currently is not medically stable to d/c.   Difficult to place patient No       Dessa Phi, DO [facility] 08/12/2020, 10:15 AM  Available via Epic secure chat 7am-7pm After these hours, please refer to coverage provider listed on [email or web address]

## 2020-08-12 NOTE — H&P (Signed)
History and Physical    Da Michelle QQP:[id number] DOB: [birth year] DOA: 08/11/2020  PCP: Loyola Mast, PA-C   Patient coming from: Home  Chief Complaint: Right upper quadrant pain with nausea and vomiting  HPI: Jennifer Booth is a 35 y.o. female with medical history significant for hypertension who presents with right upper quadrant abdominal pain associated with nausea and vomiting.  She reports symptoms for the last 2 days.  She reports she has severe pain in the right upper quadrant that intermittently waxes and wanes but never completely goes away.  He was seen yesterday morning at [facility] in the emergency room and had an ultrasound that showed cholelithiasis but no acute cholecystitis and he was discharged home.  She required morphine and Phenergan in the emergency room.  She was discharged with p.o. Phenergan and told take Tylenol and ibuprofen as needed.  They discussed with general surgery who stated they would see her as an outpatient for follow-up.  After getting home the pain returned and was worse and she was unable to tolerate any p.o. intake so she returned to the emergency room here at [facility].  She reports she has had no injury or trauma to her abdomen.  She states she is never had symptoms like this in the past.  She denies any fever, chills, diarrhea, cough, shortness of breath. Denies tobacco, alcohol or illicit drug use.  ED Course: The emergency room Ms. Pablo has been hemodynamically stable.  WBC is 7900, hemoglobin 10.6, hematocrit 32.3, platelets 330,000.  CMP shows a sodium of 140, potassium 3.5, chloride 103, bicarb 29, creatinine 1.22, BUN 16, glucose 89, bilirubin 0.2.  Alkaline phosphatase 74, AST 19, ALT 15.  Yesterday morning in the emergency room creatinine was 1.11 and BUN was 12.  He is required multiple doses of pain medication and antiemetic in the emergency room and hospital service been asked to evaluate and manage patient overnight for HIDA scan  in the morning and possible general surgery consult  Review of Systems:  General: Denies fever, chills, weight loss, night sweats. Denies dizziness.  Reports decreased appetite HENT: Denies head trauma, headache, denies change in hearing, tinnitus.  Denies nasal congestion.  Denies sore throat.  Denies difficulty swallowing Eyes: Denies blurry vision, pain in eye, drainage.  Denies discoloration of eyes. Neck: Denies pain.  Denies swelling.  Denies pain with movement. Cardiovascular: Denies chest pain, palpitations.  Denies edema.  Denies orthopnea Respiratory: Denies shortness of breath, cough. Denies wheezing.  Denies sputum production Gastrointestinal: Reports RUQ abdominal pain, nausea, vomiting. Denies diarrhea.  Denies melena.  Denies hematemesis. Musculoskeletal: Denies limitation of movement.  Denies deformity or swelling.  Denies pain.  Denies arthralgias or myalgias. Genitourinary: Denies pelvic pain.  Denies urinary frequency or hesitancy.  Denies dysuria.  Skin: Denies rash.  Denies petechiae, purpura, ecchymosis. Neurological: Denies syncope. Denies seizure activity. Denies paresthesia. Denies slurred speech, drooping face. Denies visual change. Psychiatric: Denies depression, anxiety. Denies hallucinations.  Past Medical History:  Diagnosis Date   Anemia    Pre-eclampsia 2018    Past Surgical History:  Procedure Laterality Date   CESAREAN SECTION     LAPAROSCOPIC TUBAL LIGATION Bilateral 08/02/2018   Procedure: LAPAROSCOPIC TUBAL LIGATION WITH FILSHIE CLIPS;  Surgeon: Sloan Leiter, MD;  Location: [place];  Service: Gynecology;  Laterality: Bilateral;    Social History  reports that she has never smoked. She has never used smokeless tobacco. She reports previous alcohol use. She reports that she  does not use drugs.  Allergies  Allergen Reactions   Ciprofloxacin Anaphylaxis   Zofran [Ondansetron Hcl] Anaphylaxis    Family History  Problem Relation  Age of Onset   Hypertension Mother    Diabetes Mother    Hypercholesterolemia Father    Hypertension Father    Heart disease Maternal Grandmother    Diabetes Maternal Grandmother      Prior to Admission medications   Medication Sig Start Date End Date Taking? Authorizing Provider  amLODipine (NORVASC) 10 MG tablet Take 10 mg by mouth daily. 07/17/20  Yes [provider]  hydrochlorothiazide (HYDRODIURIL) 12.5 MG tablet Take 12.5 mg by mouth every morning. 07/15/20  Yes [provider]  naproxen (NAPROSYN) 500 MG tablet Take 500 mg by mouth 2 (two) times daily as needed. 07/15/20  Yes [provider]  promethazine (PHENERGAN) 25 MG tablet Take 1 tablet (25 mg total) by mouth every 6 (six) hours as needed for nausea or vomiting. 08/11/20  Yes Barrie Folk, PA-C    Physical Exam: Vitals:   08/11/20 2334 08/12/20 0015 08/12/20 0100 08/12/20 0200  BP:  118/86 122/80 120/75  Pulse:  82 78 88  Resp: 18 18 16 18   Temp:      TempSrc:      SpO2:  100% 100% 99%  Weight:      Height:        Constitutional: NAD, calm, comfortable Vitals:   08/11/20 2334 08/12/20 0015 08/12/20 0100 08/12/20 0200  BP:  118/86 122/80 120/75  Pulse:  82 78 88  Resp: 18 18 16 18   Temp:      TempSrc:      SpO2:  100% 100% 99%  Weight:      Height:       General: WDWN, Alert and oriented x3.  Eyes: EOMI, PERRL, conjunctivae normal.  Sclera nonicteric HENT:  [place]/AT, external ears normal.  Nares patent without epistasis.  Mucous membranes are dry Neck: Soft, normal range of motion, supple, no masses,  Trachea midline Respiratory: clear to auscultation bilaterally, no wheezing, no crackles. Normal respiratory effort. No accessory muscle use.  Cardiovascular: Regular rate and rhythm, no murmurs / rubs / gallops. No extremity edema. 2+ pedal pulses.  Abdomen: Soft, RUQ abdominal tenderness, nondistended, no rebound or guarding.  Positive Murphy's sign. No masses palpated. Morbidly  obese. Bowel sounds hyperactive Musculoskeletal: FROM. no cyanosis. No joint deformity upper and lower extremities. Normal muscle tone.  Skin: Warm, dry, intact no rashes, lesions, ulcers. No induration.  Normal turgor Neurologic: CN 2-12 grossly intact.  Normal speech.  Sensation intact to touch. Strength 5/5 in all extremities.   Psychiatric: Normal judgment and insight.  Normal mood.    Labs on Admission: I have personally reviewed following labs and imaging studies  CBC: Recent Labs  Lab 08/11/20 0848 08/11/20 2300  WBC 6.3 7.9  NEUTROABS  --  3.0  HGB 11.6* 10.6*  HCT 34.9* 32.3*  MCV 94.8 95.8  PLT 328 993    Basic Metabolic Panel: Recent Labs  Lab 08/11/20 0848 08/11/20 2300  NA 137 140  K 3.3* 3.5  CL 105 103  CO2 24 29  GLUCOSE 114* 89  BUN 12 16  CREATININE 1.11* 1.22*  CALCIUM 8.6* 8.8*    GFR: Estimated Creatinine Clearance: 83.2 mL/min (A) (by C-G formula based on SCr of 1.22 mg/dL (H)).  Liver Function Tests: Recent Labs  Lab 08/11/20 0848 08/11/20 2300  AST 22 19  ALT 19 15  ALKPHOS 72 74  BILITOT 0.7 0.2*  PROT 5.9* 6.2*  ALBUMIN 3.2* 3.3*    Urine analysis:    Component Value Date/Time   COLORURINE YELLOW 08/11/2020 0845   APPEARANCEUR CLEAR 08/11/2020 0845   LABSPEC 1.020 08/11/2020 0845   PHURINE 5.0 08/11/2020 0845   GLUCOSEU NEGATIVE 08/11/2020 0845   HGBUR NEGATIVE 08/11/2020 0845   BILIRUBINUR NEGATIVE 08/11/2020 0845   KETONESUR NEGATIVE 08/11/2020 0845   PROTEINUR NEGATIVE 08/11/2020 0845   UROBILINOGEN 0.2 01/16/2020 1729   NITRITE NEGATIVE 08/11/2020 0845   LEUKOCYTESUR NEGATIVE 08/11/2020 0845    Radiological Exams on Admission: US Abdomen Limited RUQ (LIVER/GB)  Result Date: 08/11/2020 CLINICAL DATA:  Epigastric pain and nausea and vomiting for 2 days. EXAM: ULTRASOUND ABDOMEN LIMITED RIGHT UPPER QUADRANT COMPARISON:  None. FINDINGS: Gallbladder: Distended, but with a thin wall and no pericholecystic fluid or  sonographic Murphy's sign. There are dependent stones, largest measuring 1 cm. Common bile duct: Diameter: 3 mm Liver: Coarse, heterogeneous echotexture with diffusely increased echogenicity. No mass or focal lesion. Normal size. Portal vein is patent on color Doppler imaging with normal direction of blood flow towards the liver. Other: None. IMPRESSION: 1. No acute findings. 2. Cholelithiasis without evidence of acute cholecystitis. 3. Hepatic steatosis. Electronically Signed   By: Lajean Manes M.D.   On: 08/11/2020 10:22     Assessment/Plan Principal Problem:   Biliary colic Ms. Callies has recurrent right upper quadrant dull pain consistent with biliary colic.  LFTs are normal and bilirubin is not elevated.  She has no fever or signs of a sending cholangitis.  Ultrasound showed cholelithiasis but no acute cholecystitis. Obtain HIDA scan in the morning and if dysfunctional will need general surgery consult for possible cholecystectomy Patient was in the emergency room in the last few days in the ER and sent home but had recurrent symptoms so came back with continued pain nausea and vomiting Hydration with LR at 125 ml/hr  Recheck CBC and BMP in am  Active Problems:   Essential hypertension Continue home medication of Norvasc and HCTZ.  Monitor blood pressure    Nausea and vomiting Phenergan as needed for nausea and vomiting.    Obesity, Class III, BMI 40-49.9 (morbid obesity)  Follow-up with PCP for dietary, lifestyle, pharmacotherapy interventions for long-term weight loss versus possible referral for bariatric surgery     DVT prophylaxis: Lovenox for DVT prophylaxis Code Status:   Full Code  Family Communication:  Diagnosis and plan discussed with patient.  Patient verbalized understanding agrees with plan.  Further recommendations to follow as clinical indicated Disposition Plan:   Patient is from:  Home  Anticipated DC to:  Home  Anticipated DC date:  Anticipate less than 2  midnight stay  Anticipated DC barriers: No barriers to discharge identified this time  Admission status:  Observation   Eben Burow MD [facility]  How to contact the [facility] Attending or Consulting provider [place] or covering provider during after hours [place], for this patient?   Check the care team in [facility] and look for a) attending/consulting [facility] provider listed and b) the [facility] team listed Log into [email or web address] and use [place]'s universal password to access. If you do not have the password, please contact the hospital operator. Locate the [facility] provider you are looking for under [facility] and page to a number that you can be directly reached. If you still have difficulty reaching the provider, please page the [facility] (Director on Call)  for the Hospitalists listed on amion for assistance.  08/12/2020, 2:09 AM

## 2020-08-13 ENCOUNTER — Encounter (HOSPITAL_COMMUNITY)
Admission: EM | Disposition: A | Discharge status: Home / Self Care | Payer: Self-pay | Source: Home / Self Care | Attending: Internal Medicine

## 2020-08-13 ENCOUNTER — Encounter (HOSPITAL_COMMUNITY): Payer: Self-pay | Admitting: Internal Medicine

## 2020-08-13 ENCOUNTER — Inpatient Hospital Stay (HOSPITAL_COMMUNITY): Payer: Medicaid Other

## 2020-08-13 ENCOUNTER — Inpatient Hospital Stay (HOSPITAL_COMMUNITY): Payer: Medicaid Other | Admitting: Anesthesiology

## 2020-08-13 DIAGNOSIS — K8000 Calculus of gallbladder with acute cholecystitis without obstruction: Secondary | ICD-10-CM | POA: Diagnosis not present

## 2020-08-13 DIAGNOSIS — Z48815 Encounter for surgical aftercare following surgery on the digestive system: Secondary | ICD-10-CM | POA: Diagnosis not present

## 2020-08-13 DIAGNOSIS — Z20822 Contact with and (suspected) exposure to covid-19: Secondary | ICD-10-CM | POA: Diagnosis not present

## 2020-08-13 DIAGNOSIS — K8010 Calculus of gallbladder with chronic cholecystitis without obstruction: Secondary | ICD-10-CM | POA: Diagnosis not present

## 2020-08-13 DIAGNOSIS — Z79899 Other long term (current) drug therapy: Secondary | ICD-10-CM | POA: Diagnosis not present

## 2020-08-13 DIAGNOSIS — K8012 Calculus of gallbladder with acute and chronic cholecystitis without obstruction: Secondary | ICD-10-CM | POA: Diagnosis not present

## 2020-08-13 DIAGNOSIS — I10 Essential (primary) hypertension: Secondary | ICD-10-CM | POA: Diagnosis not present

## 2020-08-13 DIAGNOSIS — Z888 Allergy status to other drugs, medicaments and biological substances status: Secondary | ICD-10-CM | POA: Diagnosis not present

## 2020-08-13 DIAGNOSIS — Z6841 Body Mass Index (BMI) 40.0 and over, adult: Secondary | ICD-10-CM | POA: Diagnosis not present

## 2020-08-13 DIAGNOSIS — Z8249 Family history of ischemic heart disease and other diseases of the circulatory system: Secondary | ICD-10-CM | POA: Diagnosis not present

## 2020-08-13 DIAGNOSIS — E876 Hypokalemia: Secondary | ICD-10-CM | POA: Diagnosis not present

## 2020-08-13 DIAGNOSIS — K8066 Calculus of gallbladder and bile duct with acute and chronic cholecystitis without obstruction: Secondary | ICD-10-CM | POA: Diagnosis not present

## 2020-08-13 DIAGNOSIS — K8042 Calculus of bile duct with acute cholecystitis without obstruction: Secondary | ICD-10-CM | POA: Diagnosis not present

## 2020-08-13 DIAGNOSIS — Z881 Allergy status to other antibiotic agents status: Secondary | ICD-10-CM | POA: Diagnosis not present

## 2020-08-13 HISTORY — PX: CHOLECYSTECTOMY: SHX55

## 2020-08-13 LAB — COMPREHENSIVE METABOLIC PANEL
ALT: 19 U/L (ref 0–44)
AST: 21 U/L (ref 15–41)
Albumin: 3.1 g/dL — ABNORMAL LOW (ref 3.5–5.0)
Alkaline Phosphatase: 65 U/L (ref 38–126)
Anion gap: 7 (ref 5–15)
BUN: 15 mg/dL (ref 6–20)
CO2: 30 mmol/L (ref 22–32)
Calcium: 8.6 mg/dL — ABNORMAL LOW (ref 8.9–10.3)
Chloride: 102 mmol/L (ref 98–111)
Creatinine, Ser: 1.04 mg/dL — ABNORMAL HIGH (ref 0.44–1.00)
GFR, Estimated: >60 mL/min (ref >60)
Glucose, Bld: 106 mg/dL — ABNORMAL HIGH (ref 70–99)
Potassium: 3.5 mmol/L (ref 3.5–5.1)
Sodium: 139 mmol/L (ref 135–145)
Total Bilirubin: 0.3 mg/dL (ref 0.3–1.2)
Total Protein: 6 g/dL — ABNORMAL LOW (ref 6.5–8.1)

## 2020-08-13 LAB — CBC
HCT: 31.9 % — ABNORMAL LOW (ref 36.0–46.0)
Hemoglobin: 10.4 g/dL — ABNORMAL LOW (ref 12.0–15.0)
MCH: 31.3 pg (ref 26.0–34.0)
MCHC: 32.6 g/dL (ref 30.0–36.0)
MCV: 96.1 fL (ref 80.0–100.0)
Platelets: 312 10*3/uL (ref 150–400)
RBC: 3.32 MIL/uL — ABNORMAL LOW (ref 3.87–5.11)
RDW: 14.6 % (ref 11.5–15.5)
WBC: 5.3 10*3/uL (ref 4.0–10.5)
nRBC: 0 % (ref 0.0–0.2)

## 2020-08-13 SURGERY — LAPAROSCOPIC CHOLECYSTECTOMY WITH INTRAOPERATIVE CHOLANGIOGRAM
Anesthesia: General

## 2020-08-13 MED ORDER — SODIUM CHLORIDE 0.9 % IV SOLN
12.5000 mg | Freq: Four times a day (QID) | INTRAVENOUS | Status: DC | PRN
  Administered 2020-08-13: 12.5 mg via INTRAVENOUS
  Filled 2020-08-13: qty 0.5
  Filled 2020-08-13: qty 12.5

## 2020-08-13 MED ORDER — MIDAZOLAM HCL 2 MG/2ML IJ SOLN
INTRAMUSCULAR | Status: AC
  Filled 2020-08-13: qty 2

## 2020-08-13 MED ORDER — PHENOL 1.4 % MT LIQD
1.0000 | OROMUCOSAL | Status: DC | PRN
  Filled 2020-08-13: qty 177

## 2020-08-13 MED ORDER — FENTANYL CITRATE (PF) 100 MCG/2ML IJ SOLN
INTRAMUSCULAR | Status: DC | PRN
  Administered 2020-08-13 (×2): 100 ug via INTRAVENOUS
  Administered 2020-08-13: 50 ug via INTRAVENOUS

## 2020-08-13 MED ORDER — SODIUM CHLORIDE 0.45 % IV SOLN: INTRAVENOUS | Status: DC

## 2020-08-13 MED ORDER — LACTATED RINGERS IV SOLN: INTRAVENOUS | Status: DC

## 2020-08-13 MED ORDER — LIDOCAINE 2% (20 MG/ML) 5 ML SYRINGE
INTRAMUSCULAR | Status: DC | PRN
  Administered 2020-08-13: 100 mg via INTRAVENOUS

## 2020-08-13 MED ORDER — TRAMADOL HCL 50 MG PO TABS: 50.0000 mg | ORAL_TABLET | Freq: Four times a day (QID) | ORAL | Status: DC | PRN

## 2020-08-13 MED ORDER — SUGAMMADEX SODIUM 500 MG/5ML IV SOLN
INTRAVENOUS | Status: AC
  Filled 2020-08-13: qty 5

## 2020-08-13 MED ORDER — PROMETHAZINE HCL 25 MG/ML IJ SOLN: 6.2500 mg | INTRAMUSCULAR | Status: DC | PRN

## 2020-08-13 MED ORDER — FENTANYL CITRATE (PF) 250 MCG/5ML IJ SOLN
INTRAMUSCULAR | Status: AC
  Filled 2020-08-13: qty 5

## 2020-08-13 MED ORDER — PHENYLEPHRINE 40 MCG/ML (10ML) SYRINGE FOR IV PUSH (FOR BLOOD PRESSURE SUPPORT)
PREFILLED_SYRINGE | INTRAVENOUS | Status: AC
  Filled 2020-08-13: qty 20

## 2020-08-13 MED ORDER — MIDAZOLAM HCL 5 MG/5ML IJ SOLN
INTRAMUSCULAR | Status: DC | PRN
  Administered 2020-08-13: 2 mg via INTRAVENOUS

## 2020-08-13 MED ORDER — PROPOFOL 10 MG/ML IV BOLUS
INTRAVENOUS | Status: DC | PRN
  Administered 2020-08-13: 200 mg via INTRAVENOUS

## 2020-08-13 MED ORDER — SUGAMMADEX SODIUM 200 MG/2ML IV SOLN
INTRAVENOUS | Status: DC | PRN
  Administered 2020-08-13: 250 mg via INTRAVENOUS

## 2020-08-13 MED ORDER — KETOROLAC TROMETHAMINE 30 MG/ML IJ SOLN: 30.0000 mg | Freq: Once | INTRAMUSCULAR | Status: DC | PRN

## 2020-08-13 MED ORDER — DEXAMETHASONE SODIUM PHOSPHATE 10 MG/ML IJ SOLN
INTRAMUSCULAR | Status: AC
  Filled 2020-08-13: qty 1

## 2020-08-13 MED ORDER — ACETAMINOPHEN 325 MG PO TABS: 650.0000 mg | ORAL_TABLET | Freq: Four times a day (QID) | ORAL | Status: DC | PRN

## 2020-08-13 MED ORDER — ONDANSETRON HCL 4 MG/2ML IJ SOLN
INTRAMUSCULAR | Status: AC
  Filled 2020-08-13: qty 2

## 2020-08-13 MED ORDER — HYDROMORPHONE HCL 1 MG/ML IJ SOLN
INTRAMUSCULAR | Status: AC
  Filled 2020-08-13: qty 1

## 2020-08-13 MED ORDER — MORPHINE SULFATE (PF) 2 MG/ML IV SOLN
1.0000 mg | INTRAVENOUS | Status: DC | PRN
  Administered 2020-08-13: 2 mg via INTRAVENOUS
  Administered 2020-08-13: 1 mg via INTRAVENOUS
  Administered 2020-08-14: 2 mg via INTRAVENOUS
  Administered 2020-08-14: 1 mg via INTRAVENOUS
  Filled 2020-08-13 (×4): qty 1

## 2020-08-13 MED ORDER — BUPIVACAINE-EPINEPHRINE (PF) 0.25% -1:200000 IJ SOLN
INTRAMUSCULAR | Status: AC
  Filled 2020-08-13: qty 30

## 2020-08-13 MED ORDER — HYDROMORPHONE HCL 1 MG/ML IJ SOLN
1.0000 mg | INTRAMUSCULAR | Status: DC | PRN
  Administered 2020-08-13: 1 mg via INTRAVENOUS
  Filled 2020-08-13: qty 1

## 2020-08-13 MED ORDER — ROCURONIUM BROMIDE 10 MG/ML (PF) SYRINGE
PREFILLED_SYRINGE | INTRAVENOUS | Status: DC | PRN
  Administered 2020-08-13: 50 mg via INTRAVENOUS
  Administered 2020-08-13: 10 mg via INTRAVENOUS

## 2020-08-13 MED ORDER — DEXAMETHASONE SODIUM PHOSPHATE 10 MG/ML IJ SOLN
INTRAMUSCULAR | Status: DC | PRN
  Administered 2020-08-13: 10 mg via INTRAVENOUS

## 2020-08-13 MED ORDER — PHENYLEPHRINE 40 MCG/ML (10ML) SYRINGE FOR IV PUSH (FOR BLOOD PRESSURE SUPPORT)
PREFILLED_SYRINGE | INTRAVENOUS | Status: AC
  Filled 2020-08-13: qty 10

## 2020-08-13 MED ORDER — SUCCINYLCHOLINE CHLORIDE 20 MG/ML IJ SOLN
INTRAMUSCULAR | Status: DC | PRN
  Administered 2020-08-13: 160 mg via INTRAVENOUS

## 2020-08-13 MED ORDER — LIDOCAINE 2% (20 MG/ML) 5 ML SYRINGE
INTRAMUSCULAR | Status: AC
  Filled 2020-08-13: qty 10

## 2020-08-13 MED ORDER — PHENYLEPHRINE 40 MCG/ML (10ML) SYRINGE FOR IV PUSH (FOR BLOOD PRESSURE SUPPORT)
PREFILLED_SYRINGE | INTRAVENOUS | Status: DC | PRN
  Administered 2020-08-13 (×2): 80 ug via INTRAVENOUS
  Administered 2020-08-13: 160 ug via INTRAVENOUS
  Administered 2020-08-13: 80 ug via INTRAVENOUS

## 2020-08-13 MED ORDER — ACETAMINOPHEN 650 MG RE SUPP: 650.0000 mg | Freq: Four times a day (QID) | RECTAL | Status: DC | PRN

## 2020-08-13 MED ORDER — ROCURONIUM BROMIDE 10 MG/ML (PF) SYRINGE
PREFILLED_SYRINGE | INTRAVENOUS | Status: AC
  Filled 2020-08-13: qty 20

## 2020-08-13 MED ORDER — HYDROMORPHONE HCL 1 MG/ML IJ SOLN
0.2500 mg | INTRAMUSCULAR | Status: DC | PRN
  Administered 2020-08-13 (×4): 0.5 mg via INTRAVENOUS

## 2020-08-13 MED ORDER — MORPHINE SULFATE (PF) 2 MG/ML IV SOLN
1.0000 mg | Freq: Once | INTRAVENOUS | Status: AC
  Administered 2020-08-13: 1 mg via INTRAVENOUS
  Filled 2020-08-13: qty 1

## 2020-08-13 MED ORDER — BUPIVACAINE-EPINEPHRINE (PF) 0.25% -1:200000 IJ SOLN
INTRAMUSCULAR | Status: DC | PRN
  Administered 2020-08-13: 30 mL via PERINEURAL

## 2020-08-13 MED ORDER — OXYCODONE HCL 5 MG PO TABS
5.0000 mg | ORAL_TABLET | ORAL | Status: DC | PRN
  Administered 2020-08-13: 10 mg via ORAL
  Administered 2020-08-14: 5 mg via ORAL
  Filled 2020-08-13: qty 2
  Filled 2020-08-13: qty 1

## 2020-08-13 MED ORDER — PROPOFOL 10 MG/ML IV BOLUS
INTRAVENOUS | Status: AC
  Filled 2020-08-13: qty 20

## 2020-08-13 MED ORDER — SODIUM CHLORIDE (PF) 0.9 % IJ SOLN
INTRAMUSCULAR | Status: DC | PRN
  Administered 2020-08-13: 17 mL

## 2020-08-13 MED ORDER — MEPERIDINE HCL 50 MG/ML IJ SOLN: 6.2500 mg | INTRAMUSCULAR | Status: DC | PRN

## 2020-08-13 SURGICAL SUPPLY — 34 items
APPLIER CLIP ROT 10 11.4 M/L (STAPLE) ×2
BAG COUNTER SPONGE SURGICOUNT (BAG) IMPLANT
CABLE HIGH FREQUENCY MONO STRZ (ELECTRODE) ×2 IMPLANT
CHLORAPREP W/TINT 26 (MISCELLANEOUS) ×4 IMPLANT
CLIP APPLIE ROT 10 11.4 M/L (STAPLE) ×1 IMPLANT
COVER MAYO STAND STRL (DRAPES) ×2 IMPLANT
COVER SURGICAL LIGHT HANDLE (MISCELLANEOUS) ×2 IMPLANT
DECANTER SPIKE VIAL GLASS SM (MISCELLANEOUS) ×2 IMPLANT
DERMABOND ADVANCED (GAUZE/BANDAGES/DRESSINGS) ×1
DERMABOND ADVANCED .7 DNX12 (GAUZE/BANDAGES/DRESSINGS) IMPLANT
DRAPE C-ARM 42X120 X-RAY (DRAPES) ×2 IMPLANT
ELECT REM PT RETURN 15FT ADLT (MISCELLANEOUS) ×2 IMPLANT
GAUZE SPONGE 2X2 8PLY STRL LF (GAUZE/BANDAGES/DRESSINGS) ×1 IMPLANT
GLOVE SURG ORTHO LTX SZ8 (GLOVE) ×2 IMPLANT
GOWN STRL REUS W/TWL XL LVL3 (GOWN DISPOSABLE) ×4 IMPLANT
HEMOSTAT SURGICEL 4X8 (HEMOSTASIS) IMPLANT
KIT BASIN OR (CUSTOM PROCEDURE TRAY) ×2 IMPLANT
KIT TURNOVER KIT A (KITS) ×2 IMPLANT
PENCIL SMOKE EVACUATOR (MISCELLANEOUS) IMPLANT
POUCH SPECIMEN RETRIEVAL 10MM (ENDOMECHANICALS) ×2 IMPLANT
SCISSORS LAP 5X35 DISP (ENDOMECHANICALS) ×2 IMPLANT
SET CHOLANGIOGRAPH MIX (MISCELLANEOUS) ×2 IMPLANT
SET IRRIG TUBING LAPAROSCOPIC (IRRIGATION / IRRIGATOR) ×2 IMPLANT
SET TUBE SMOKE EVAC HIGH FLOW (TUBING) IMPLANT
SLEEVE XCEL OPT CAN 5 100 (ENDOMECHANICALS) ×2 IMPLANT
SPONGE GAUZE 2X2 STER 10/PKG (GAUZE/BANDAGES/DRESSINGS) ×1
STRIP CLOSURE SKIN 1/2X4 (GAUZE/BANDAGES/DRESSINGS) IMPLANT
SUT MNCRL AB 4-0 PS2 18 (SUTURE) ×2 IMPLANT
TOWEL OR 17X26 10 PK STRL BLUE (TOWEL DISPOSABLE) ×2 IMPLANT
TOWEL OR NON WOVEN STRL DISP B (DISPOSABLE) ×2 IMPLANT
TRAY LAPAROSCOPIC (CUSTOM PROCEDURE TRAY) ×2 IMPLANT
TROCAR BLADELESS OPT 5 100 (ENDOMECHANICALS) ×2 IMPLANT
TROCAR XCEL BLUNT TIP 100MML (ENDOMECHANICALS) ×2 IMPLANT
TROCAR XCEL NON-BLD 11X100MML (ENDOMECHANICALS) ×2 IMPLANT

## 2020-08-13 NOTE — Op Note (Signed)
Procedure Note  Pre-operative Diagnosis:  chronic cholecystitis, cholelithiasis  Post-operative Diagnosis:  same  Surgeon:  Armandina Gemma, MD  Assistant:  none   Procedure:  Laparoscopic cholecystectomy with intra-operative cholangiography  Anesthesia:  General  Estimated Blood Loss:  minimal  Drains: none         Specimen: gallbladder to pathology  Indications:  Patient is a 35 yo female with chronic cholecystitis and cholelithiasis for cholecystectomy.  Procedure Details:  The patient was seen in the pre-op holding area. The risks, benefits, complications, treatment options, and expected outcomes were previously discussed with the patient. The patient agreed with the proposed plan and has signed the informed consent form.  The patient was transported to operating room #4 at the [facility]. The patient was placed in the supine position on the operating room table. Following induction of general anesthesia, the abdomen was prepped and draped in the usual aseptic fashion.  An incision was made in the skin near the umbilicus. The midline fascia was incised and the peritoneal cavity was entered and a Hasson cannula was introduced under direct vision. The cannula was secured with a 0-Vicryl pursestring suture. Pneumoperitoneum was established with carbon dioxide. Additional cannulae were introduced under direct vision along the right costal margin in the midline, mid-clavicular line, and anterior axillary line.   The gallbladder was identified and the fundus grasped and retracted cephalad. Adhesions were taken down bluntly and the electrocautery was utilized as needed, taking care not to involve any adjacent structures. The infundibulum was grasped and retracted laterally, exposing the peritoneum overlying the triangle of Calot. The peritoneum was incised and structures exposed with blunt dissection. The cystic duct was clearly identified, bluntly dissected circumferentially, and  clipped at the neck of the gallbladder.  An incision was made in the cystic duct and the cholangiogram catheter introduced. The catheter was secured using an ligaclip. Initial attempts at irrigation indicated obstruction in the cystic duct. Attempts at "milking" material out of the cystic duct were unsuccessful. Catheter was re-positioned and real-time cholangiography was performed using C-arm fluoroscopy.  There was some leakage of contrast but there was filling of a normal caliber common bile duct.  There was reflux of contrast into the left and right hepatic ductal systems.  There was free flow distally into the duodenum without filling defect or obstruction.  The catheter was removed from the peritoneal cavity.  The cystic duct was then ligated with ligaclips and divided. The cystic artery was identified, dissected circumferentially, ligated with ligaclips, and divided.  The gallbladder was dissected away from the gallbladder bed using the electrocautery for hemostasis. The gallbladder was completely removed from the liver and placed into an endocatch bag. The gallbladder was removed in the endocatch bag through the umbilical port site and submitted to pathology for review.  The right upper quadrant was irrigated and the gallbladder bed was inspected. Hemostasis was achieved with the electrocautery.  Cannulae were removed under direct vision and good hemostasis was noted. Pneumoperitoneum was released and the majority of the carbon dioxide evacuated. The umbilical wound was irrigated and the fascia was then closed with the pursestring suture.  Local anesthetic was infiltrated at all port sites. Skin incisions were closed with 4-0 Monocril subcuticular sutures and Dermabond was applied.  Instrument, sponge, and needle counts were correct at the conclusion of the case.  The patient was awakened from anesthesia and brought to the recovery room in stable condition.  The patient tolerated the procedure  well.  Armandina Gemma, MD [facility] Office: [phone]

## 2020-08-13 NOTE — Progress Notes (Signed)
PROGRESS NOTE    Jennifer Booth  AOZ:[id number] DOB: 22-Jul-1985 DOA: 08/11/2020 PCP: Loyola Mast, PA-C   Chief Complain: Right upper quadrant pain, nausea ,vomiting  Brief Narrative:  Patient is a 35 year old female with history of morbid obesity, hypertension who presented with complaints of right upper quadrant pain, nausea, vomiting.  Symptoms lasted for 2 days before admission.  She initially presented at [facility] emergency department and was discharged to home with plan for outpatient follow-up with general surgery.  After getting home, she again developed abdominal pain and presented to the emergency department at [facility].  On presentation she was hemodynamically stable.  General surgery consulted.  Assessment & Plan:   Principal Problem:   Cholelithiasis with chronic cholecystitis Active Problems:   Biliary colic   Essential hypertension   Obesity, Class III, BMI 40-49.9 (morbid obesity) (HCC)   Nausea and vomiting   Biliary colic/acute cholecystitis: Presented with right upper abdomen pain, nausea and vomiting.  Ultrasound of the abdomen showed cholelithiasis without signs of cholecystitis.  HIDA scan showed cystic duct obstruction, suspicion for acute cholecystitis. She was started on ceftriaxone.  General surgery consulted and the plan is for laparoscopic cholecystectomy. Will check with general surgery if she needs antibiotics on discharge. Continue IV fluids  Morbid obesity: BMI of 47.8.  Hypertension: On amlodipine and hydrochlorothiazide at home.  Currently blood pressure stable            DVT prophylaxis:Lovenox Code Status: Full Family Communication: None at bedside Status is: Inpatient  Remains inpatient appropriate because:Inpatient level of care appropriate due to severity of illness  Dispo: The patient is from: Home              Anticipated d/c is to: Home              Patient currently is not medically stable to d/c.   Difficult to  place patient No      Consultants: General surgery  Procedures:Plan for cholecystectomy  Antimicrobials:  Anti-infectives (From admission, onward)    Start     Dose/Rate Route Frequency Ordered Stop   08/12/20 1700  [MAR Hold]  cefTRIAXone (ROCEPHIN) 2 g in sodium chloride 0.9 % 100 mL IVPB        (MAR Hold since Wed 08/13/2020 at 0939.Hold Reason: Transfer to a Procedural area)   2 g 200 mL/hr over 30 Minutes Intravenous Every 24 hours 08/12/20 1605         Subjective:  Patient seen and examined the bedside this morning.  She looked comfortable during my evaluation.  Hemodynamically stable.  She is still having mild right upper quadrant discomfort.  No nausea or vomiting  Objective: Vitals:   08/12/20 0846 08/12/20 1429 08/12/20 2216 08/13/20 0601  BP: (!) 132/105 (!) 135/94 123/82 124/87  Pulse: 70 82 88 70  Resp: 18 18 18 20   Temp: 98.5 F (36.9 C) 98.8 F (37.1 C) 98.1 F (36.7 C) 98.3 F (36.8 C)  TempSrc: Oral Oral Oral   SpO2: 100% 100% 99% 100%  Weight:      Height:        Intake/Output Summary (Last 24 hours) at 08/13/2020 1307 Last data filed at 08/13/2020 1227 Gross per 24 hour  Intake 1000 ml  Output 25 ml  Net 975 ml   Filed Weights   08/11/20 2203  Weight: 122.5 kg    Examination:  General exam: Appears calm and comfortable ,Not in distress, morbidly obese HEENT:PERRL,Oral mucosa moist, Ear/Nose normal  on gross exam Respiratory system: Bilateral equal air entry, normal vesicular breath sounds, no wheezes or crackles  Cardiovascular system: S1 & S2 heard, RRR. No JVD, murmurs, rubs, gallops or clicks. No pedal edema. Gastrointestinal system: Abdomen is nondistended, soft .tenderness on the right upper quadrant  Central nervous system: Alert and oriented. No focal neurological deficits. Extremities: No edema, no clubbing ,no cyanosis Skin: No rashes, lesions or ulcers,no icterus ,no pallor    Data Reviewed: I have personally reviewed following  labs and imaging studies  CBC: Recent Labs  Lab 08/11/20 0848 08/11/20 2300 08/12/20 0600 08/13/20 0304  WBC 6.3 7.9 7.1 5.3  NEUTROABS  --  3.0  --   --   HGB 11.6* 10.6* 11.1* 10.4*  HCT 34.9* 32.3* 34.0* 31.9*  MCV 94.8 95.8 96.9 96.1  PLT 328 330 326 469   Basic Metabolic Panel: Recent Labs  Lab 08/11/20 0848 08/11/20 2300 08/12/20 0600 08/13/20 0304  NA 137 140 140 139  K 3.3* 3.5 3.8 3.5  CL 105 103 104 102  CO2 24 29 28 30   GLUCOSE 114* 89 85 106*  BUN 12 16 12 15   CREATININE 1.11* 1.22* 1.05*  1.08* 1.04*  CALCIUM 8.6* 8.8* 8.4* 8.6*   GFR: Estimated Creatinine Clearance: 97.7 mL/min (A) (by C-G formula based on SCr of 1.04 mg/dL (H)). Liver Function Tests: Recent Labs  Lab 08/11/20 0848 08/11/20 2300 08/13/20 0304  AST 22 19 21   ALT 19 15 19   ALKPHOS 72 74 65  BILITOT 0.7 0.2* 0.3  PROT 5.9* 6.2* 6.0*  ALBUMIN 3.2* 3.3* 3.1*   Recent Labs  Lab 08/11/20 0848 08/11/20 2300  LIPASE 29 35   No results for input(s): AMMONIA in the last 168 hours. Coagulation Profile: No results for input(s): INR, PROTIME in the last 168 hours. Cardiac Enzymes: No results for input(s): CKTOTAL, CKMB, CKMBINDEX, TROPONINI in the last 168 hours. BNP (last 3 results) No results for input(s): PROBNP in the last 8760 hours. HbA1C: No results for input(s): HGBA1C in the last 72 hours. CBG: No results for input(s): GLUCAP in the last 168 hours. Lipid Profile: No results for input(s): CHOL, HDL, LDLCALC, TRIG, CHOLHDL, LDLDIRECT in the last 72 hours. Thyroid Function Tests: No results for input(s): TSH, T4TOTAL, FREET4, T3FREE, THYROIDAB in the last 72 hours. Anemia Panel: No results for input(s): VITAMINB12, FOLATE, FERRITIN, TIBC, IRON, RETICCTPCT in the last 72 hours. Sepsis Labs: No results for input(s): PROCALCITON, LATICACIDVEN in the last 168 hours.  Recent Results (from the past 240 hour(s))  Resp Panel by RT-PCR (Flu A&B, Covid) Nasopharyngeal Swab      Status: None   Collection Time: 08/12/20  1:11 AM   Specimen: Nasopharyngeal Swab; Nasopharyngeal(NP) swabs in vial transport medium  Result Value Ref Range Status   SARS Coronavirus 2 by RT PCR NEGATIVE NEGATIVE Final    Comment: (NOTE) SARS-CoV-2 target nucleic acids are NOT DETECTED.  The SARS-CoV-2 RNA is generally detectable in upper respiratory specimens during the acute phase of infection. The lowest concentration of SARS-CoV-2 viral copies this assay can detect is 138 copies/mL. A negative result does not preclude SARS-Cov-2 infection and should not be used as the sole basis for treatment or other patient management decisions. A negative result may occur with  improper specimen collection/handling, submission of specimen other than nasopharyngeal swab, presence of viral mutation(s) within the areas targeted by this assay, and inadequate number of viral copies(<138 copies/mL). A negative result must be combined with clinical observations,  patient history, and epidemiological information. The expected result is Negative.  Fact Sheet for Patients:  EntrepreneurPulse.com.au  Fact Sheet for Healthcare Providers:  IncredibleEmployment.be  This test is no t yet approved or cleared by the [place] FDA and  has been authorized for detection and/or diagnosis of SARS-CoV-2 by FDA under an Emergency Use Authorization (EUA). This EUA will remain  in effect (meaning this test can be used) for the duration of the COVID-19 declaration under Section 564(b)(1) of the Act, 21 U.S.C.section 360bbb-3(b)(1), unless the authorization is terminated  or revoked sooner.       Influenza A by PCR NEGATIVE NEGATIVE Final   Influenza B by PCR NEGATIVE NEGATIVE Final    Comment: (NOTE) The Xpert Xpress SARS-CoV-2/FLU/RSV plus assay is intended as an aid in the diagnosis of influenza from Nasopharyngeal swab specimens and should not be used as a sole basis  for treatment. Nasal washings and aspirates are unacceptable for Xpert Xpress SARS-CoV-2/FLU/RSV testing.  Fact Sheet for Patients: EntrepreneurPulse.com.au  Fact Sheet for Healthcare Providers: IncredibleEmployment.be  This test is not yet approved or cleared by the [place] FDA and has been authorized for detection and/or diagnosis of SARS-CoV-2 by FDA under an Emergency Use Authorization (EUA). This EUA will remain in effect (meaning this test can be used) for the duration of the COVID-19 declaration under Section 564(b)(1) of the Act, 21 U.S.C. section 360bbb-3(b)(1), unless the authorization is terminated or revoked.  Performed at [facility], [place] [address], [place], [place] [address]          Radiology Studies: DG Cholangiogram Operative  Result Date: 08/13/2020 CLINICAL DATA:  34 year old female with history of acute calculus cholecystitis. EXAM: INTRAOPERATIVE CHOLANGIOGRAM TECHNIQUE: Cholangiographic images from the C-arm fluoroscopic device were submitted for interpretation post-operatively. Please see the procedural report for the amount of contrast and the fluoroscopy time utilized. COMPARISON:  None. FINDINGS: Intraoperative antegrade injection via the cystic duct which flows retrograde into the gallbladder fossa and along the inferior hepatic capsule. The remainder of the contrast Opacifies the common bile duct and central portions of the intrahepatic biliary tree. Contrast is visualized flowing freely into the duodenum. There are no filling defects. No significant intra or extrahepatic biliary ductal dilation. No apparent anomalous anatomical configuration of the biliary tree. IMPRESSION: 1. No evidence of choledocholithiasis. 2. There is spillage of contrast material into the gallbladder fossa, likely secondary to suboptimal position/poor seal within the remnant cystic duct of the catheter through which contrast  is injected. If there is clinical concern for biloma, recommend repeat HIDA scan. Ruthann Cancer, MD [facility] Radiology Electronically Signed   By: Ruthann Cancer MD   On: 08/13/2020 12:45   NM Hepatobiliary Liver Func  Result Date: 08/12/2020 CLINICAL DATA:  Recurrent biliary colic, cholelithiasis EXAM: NUCLEAR MEDICINE HEPATOBILIARY IMAGING TECHNIQUE: Sequential images of the abdomen were obtained out to 60 minutes following intravenous administration of radiopharmaceutical. RADIOPHARMACEUTICALS:  5.5 mCi Tc-37m  Choletec IV COMPARISON:  None. FINDINGS: Prompt uptake and biliary excretion of activity by the liver is seen. Biliary activity passes into small bowel, consistent with patent common bile duct. The gallbladder is not visualized through 60 minutes. IMPRESSION: 1.  The common bile duct is patent. 2. The gallbladder is not visualized through 60 minutes, concerning for cystic duct obstruction and acute cholecystitis. Note that due to technologist error, the patient was not imaged through 120 minutes to definitively prove cystic duct obstruction. Technical repeat examination could be  performed if desired based on equivocal clinical presentation. Electronically Signed   By: Eddie Candle M.D.   On: 08/12/2020 14:29        Scheduled Meds:  [MAR Hold] amLODipine  10 mg Oral Daily   [MAR Hold] enoxaparin (LOVENOX) injection  60 mg Subcutaneous Q24H   [MAR Hold] hydrochlorothiazide  12.5 mg Oral q morning   Continuous Infusions:  sodium chloride 100 mL/hr at 08/12/20 1901   [MAR Hold] cefTRIAXone (ROCEPHIN)  IV 2 g (08/12/20 1903)   lactated ringers 50 mL/hr at 08/13/20 1116   [MAR Hold] promethazine (PHENERGAN) injection (IM or IVPB)       LOS: 1 day    Time spent: 25 mins.More than 50% of that time was spent in counseling and/or coordination of care.      Shelly Coss, MD [facility] P7/07/2020, 1:07 PM

## 2020-08-13 NOTE — Interval H&P Note (Signed)
History and Physical Interval Note:  08/13/2020 11:06 AM  Jennifer Booth  has presented today for surgery, with the diagnosis of acute cholecystitis.  The various methods of treatment have been discussed with the patient and family. After consideration of risks, benefits and other options for treatment, the patient has consented to    Procedure(s): LAPAROSCOPIC CHOLECYSTECTOMY WITH POSSIBLE INTRAOPERATIVE CHOLANGIOGRAM (N/A) as a surgical intervention.    The patient's history has been reviewed, patient examined, no change in status, stable for surgery.  I have reviewed the patient's chart and labs.  Questions were answered to the patient's satisfaction.    Armandina Gemma, MD [facility] Office: [place]

## 2020-08-13 NOTE — Anesthesia Procedure Notes (Signed)
Procedure Name: Intubation Date/Time: 08/13/2020 11:46 AM Performed by: Gean Maidens, CRNA Pre-anesthesia Checklist: Patient identified, Emergency Drugs available, Suction available, Patient being monitored and Timeout performed Patient Re-evaluated:Patient Re-evaluated prior to induction Oxygen Delivery Method: Circle system utilized Preoxygenation: Pre-oxygenation with 100% oxygen Induction Type: IV induction Ventilation: Mask ventilation without difficulty Laryngoscope Size: Mac and 4 Grade View: Grade I Tube type: Oral Tube size: 7.5 mm Number of attempts: 1 Airway Equipment and Method: Stylet Placement Confirmation: ETT inserted through vocal cords under direct vision, positive ETCO2 and breath sounds checked- equal and bilateral Secured at: 21 cm Tube secured with: Tape Dental Injury: Teeth and Oropharynx as per pre-operative assessment

## 2020-08-13 NOTE — Anesthesia Preprocedure Evaluation (Addendum)
Anesthesia Evaluation  Patient identified by MRN, date of birth, ID band Patient awake    Reviewed: Allergy & Precautions, NPO status , Patient's Chart, lab work & pertinent test results  Airway Mallampati: III       Dental no notable dental hx.    Pulmonary    Pulmonary exam normal        Cardiovascular hypertension, Pt. on medications Normal cardiovascular exam     Neuro/Psych    GI/Hepatic negative GI ROS, Neg liver ROS,   Endo/Other  Morbid obesity  Renal/GU negative Renal ROS  negative genitourinary   Musculoskeletal negative musculoskeletal ROS (+)   Abdominal (+) + obese,   Peds  Hematology  (+) anemia ,   Anesthesia Other Findings   Reproductive/Obstetrics                             Anesthesia Physical Anesthesia Plan  ASA: 3  Anesthesia Plan: General   Post-op Pain Management:    Induction:   PONV Risk Score and Plan: 4 or greater and Midazolam and Dexamethasone  Airway Management Planned: Oral ETT  Additional Equipment: None  Intra-op Plan:   Post-operative Plan: Extubation in OR  Informed Consent: I have reviewed the patients History and Physical, chart, labs and discussed the procedure including the risks, benefits and alternatives for the proposed anesthesia with the patient or authorized representative who has indicated his/her understanding and acceptance.     Dental advisory given  Plan Discussed with: CRNA  Anesthesia Plan Comments:        Anesthesia Quick Evaluation

## 2020-08-13 NOTE — Transfer of Care (Signed)
Immediate Anesthesia Transfer of Care Note  Patient: Jennifer Booth  Procedure(s) Performed: LAPAROSCOPIC CHOLECYSTECTOMY WITH INTRAOPERATIVE CHOLANGIOGRAM  Patient Location: PACU  Anesthesia Type:General  Level of Consciousness: awake, alert  and oriented  Airway & Oxygen Therapy: Patient Spontanous Breathing and Patient connected to face mask oxygen  Post-op Assessment: Report given to RN and Post -op Vital signs reviewed and stable  Post vital signs: Reviewed and stable  Last Vitals:  Vitals Value Taken Time  BP 147/99 08/13/20 1311  Temp    Pulse 94 08/13/20 1313  Resp 13 08/13/20 1313  SpO2 100 % 08/13/20 1313  Vitals shown include unvalidated device data.  Last Pain:  Vitals:   08/13/20 0850  TempSrc:   PainSc: 2       Patients Stated Pain Goal: 4 (87/56/43 3295)  Complications: No notable events documented.

## 2020-08-14 ENCOUNTER — Encounter (HOSPITAL_COMMUNITY): Payer: Self-pay | Admitting: Surgery

## 2020-08-14 DIAGNOSIS — K8010 Calculus of gallbladder with chronic cholecystitis without obstruction: Secondary | ICD-10-CM | POA: Diagnosis not present

## 2020-08-14 LAB — CBC WITH DIFFERENTIAL/PLATELET
Abs Immature Granulocytes: 0.02 10*3/uL (ref 0.00–0.07)
Basophils Absolute: 0 10*3/uL (ref 0.0–0.1)
Basophils Relative: 0 %
Eosinophils Absolute: 0 10*3/uL (ref 0.0–0.5)
Eosinophils Relative: 0 %
HCT: 31.1 % — ABNORMAL LOW (ref 36.0–46.0)
Hemoglobin: 10.3 g/dL — ABNORMAL LOW (ref 12.0–15.0)
Immature Granulocytes: 0 %
Lymphocytes Relative: 10 %
Lymphs Abs: 0.9 10*3/uL (ref 0.7–4.0)
MCH: 31.5 pg (ref 26.0–34.0)
MCHC: 33.1 g/dL (ref 30.0–36.0)
MCV: 95.1 fL (ref 80.0–100.0)
Monocytes Absolute: 0.5 10*3/uL (ref 0.1–1.0)
Monocytes Relative: 5 %
Neutro Abs: 8.1 10*3/uL — ABNORMAL HIGH (ref 1.7–7.7)
Neutrophils Relative %: 85 %
Platelets: 331 10*3/uL (ref 150–400)
RBC: 3.27 MIL/uL — ABNORMAL LOW (ref 3.87–5.11)
RDW: 14.6 % (ref 11.5–15.5)
WBC: 9.5 10*3/uL (ref 4.0–10.5)
nRBC: 0 % (ref 0.0–0.2)

## 2020-08-14 LAB — COMPREHENSIVE METABOLIC PANEL
ALT: 343 U/L — ABNORMAL HIGH (ref 0–44)
AST: 649 U/L — ABNORMAL HIGH (ref 15–41)
Albumin: 3.3 g/dL — ABNORMAL LOW (ref 3.5–5.0)
Alkaline Phosphatase: 120 U/L (ref 38–126)
Anion gap: 7 (ref 5–15)
BUN: 16 mg/dL (ref 6–20)
CO2: 26 mmol/L (ref 22–32)
Calcium: 8.5 mg/dL — ABNORMAL LOW (ref 8.9–10.3)
Chloride: 104 mmol/L (ref 98–111)
Creatinine, Ser: 0.89 mg/dL (ref 0.44–1.00)
GFR, Estimated: >60 mL/min (ref >60)
Glucose, Bld: 167 mg/dL — ABNORMAL HIGH (ref 70–99)
Potassium: 4.1 mmol/L (ref 3.5–5.1)
Sodium: 137 mmol/L (ref 135–145)
Total Bilirubin: 0.4 mg/dL (ref 0.3–1.2)
Total Protein: 6.4 g/dL — ABNORMAL LOW (ref 6.5–8.1)

## 2020-08-14 LAB — SURGICAL PATHOLOGY

## 2020-08-14 MED ORDER — OXYCODONE HCL 5 MG PO TABS: 5.0000 mg | ORAL_TABLET | ORAL | 0 refills | Status: DC | PRN

## 2020-08-14 MED ORDER — ACETAMINOPHEN 325 MG PO TABS: 650.0000 mg | ORAL_TABLET | Freq: Four times a day (QID) | ORAL | Status: DC | PRN

## 2020-08-14 MED ORDER — DOCUSATE SODIUM 100 MG PO CAPS: 100.0000 mg | ORAL_CAPSULE | Freq: Two times a day (BID) | ORAL | Status: DC | PRN

## 2020-08-14 MED ORDER — DIPHENHYDRAMINE HCL 50 MG/ML IJ SOLN
12.5000 mg | Freq: Once | INTRAMUSCULAR | Status: AC
  Administered 2020-08-14: 12.5 mg via INTRAVENOUS
  Filled 2020-08-14: qty 1

## 2020-08-14 MED ORDER — SODIUM CHLORIDE 0.9 % IV BOLUS
500.0000 mL | Freq: Once | INTRAVENOUS | Status: AC
  Administered 2020-08-14: 500 mL via INTRAVENOUS

## 2020-08-14 MED ORDER — ENOXAPARIN SODIUM 40 MG/0.4ML IJ SOSY
40.0000 mg | PREFILLED_SYRINGE | Freq: Every day | INTRAMUSCULAR | Status: DC
  Administered 2020-08-14: 40 mg via SUBCUTANEOUS
  Filled 2020-08-14: qty 0.4

## 2020-08-14 MED ORDER — DOCUSATE SODIUM 100 MG PO CAPS
100.0000 mg | ORAL_CAPSULE | Freq: Two times a day (BID) | ORAL | Status: DC
  Administered 2020-08-14: 100 mg via ORAL
  Filled 2020-08-14: qty 1

## 2020-08-14 NOTE — Plan of Care (Signed)

## 2020-08-14 NOTE — Progress Notes (Signed)
Progress Note  1 Day Post-Op  Subjective: Patient reports abdomen is sore but pain better than prior to surgery. She denies nausea and is tolerating diet. Passed flatus yesterday, none yet today. No BM since Sunday, declines laxative here.   Objective: Vital signs in last 24 hours: Temp:  [97.6 F (36.4 C)-98.3 F (36.8 C)] 97.7 F (36.5 C) (07/07 0931) Pulse Rate:  [80-106] 94 (07/07 0931) Resp:  [11-18] 18 (07/07 0931) BP: (97-159)/(57-104) 159/90 (07/07 0931) SpO2:  [97 %-100 %] 98 % (07/07 0931) Last BM Date: 08/10/20  Intake/Output from previous day: 07/06 0701 - 07/07 0700 In: 3470 [P.O.:1440; I.V.:1980; IV Piggyback:50] Out: 2625 [Urine:2600; Blood:25] Intake/Output this shift: Total I/O In: 240 [P.O.:240] Out: -   PE: General: pleasant, WD, obese female who is sitting up in chair  HEENT: sclera anicteric  Heart: regular, rate, and rhythm.  Normal s1,s2. No obvious murmurs, gallops, or rubs noted.  Palpable radial and pedal pulses bilaterally Lungs: CTAB, no wheezes, rhonchi, or rales noted.  Respiratory effort nonlabored Abd: soft, appropriately ttp, ND, incisions c/d/I  MS: all 4 extremities are symmetrical with no cyanosis, clubbing, or edema. Skin: warm and dry with no masses, lesions, or rashes Neuro: Cranial nerves 2-12 grossly intact, sensation is normal throughout Psych: A&Ox3 with an appropriate affect.    Lab Results:  Recent Labs    08/13/20 0304 08/14/20 0423  WBC 5.3 9.5  HGB 10.4* 10.3*  HCT 31.9* 31.1*  PLT 312 331   BMET Recent Labs    08/13/20 0304 08/14/20 0423  NA 139 137  K 3.5 4.1  CL 102 104  CO2 30 26  GLUCOSE 106* 167*  BUN 15 16  CREATININE 1.04* 0.89  CALCIUM 8.6* 8.5*   PT/INR No results for input(s): LABPROT, INR in the last 72 hours. CMP     Component Value Date/Time   NA 137 08/14/2020 0423   K 4.1 08/14/2020 0423   CL 104 08/14/2020 0423   CO2 26 08/14/2020 0423   GLUCOSE 167 (H) 08/14/2020 0423   BUN  16 08/14/2020 0423   CREATININE 0.89 08/14/2020 0423   CALCIUM 8.5 (L) 08/14/2020 0423   PROT 6.4 (L) 08/14/2020 0423   ALBUMIN 3.3 (L) 08/14/2020 0423   AST 649 (H) 08/14/2020 0423   ALT 343 (H) 08/14/2020 0423   ALKPHOS 120 08/14/2020 0423   BILITOT 0.4 08/14/2020 0423   GFRNONAA >60 08/14/2020 0423   GFRAA >60 11/06/2019 1033   Lipase     Component Value Date/Time   LIPASE 35 08/11/2020 2300       Studies/Results: DG Cholangiogram Operative  Result Date: 08/13/2020 CLINICAL DATA:  35 year old female with history of acute calculus cholecystitis. EXAM: INTRAOPERATIVE CHOLANGIOGRAM TECHNIQUE: Cholangiographic images from the C-arm fluoroscopic device were submitted for interpretation post-operatively. Please see the procedural report for the amount of contrast and the fluoroscopy time utilized. COMPARISON:  None. FINDINGS: Intraoperative antegrade injection via the cystic duct which flows retrograde into the gallbladder fossa and along the inferior hepatic capsule. The remainder of the contrast Opacifies the common bile duct and central portions of the intrahepatic biliary tree. Contrast is visualized flowing freely into the duodenum. There are no filling defects. No significant intra or extrahepatic biliary ductal dilation. No apparent anomalous anatomical configuration of the biliary tree. IMPRESSION: 1. No evidence of choledocholithiasis. 2. There is spillage of contrast material into the gallbladder fossa, likely secondary to suboptimal position/poor seal within the remnant cystic duct of the catheter  through which contrast is injected. If there is clinical concern for biloma, recommend repeat HIDA scan. Ruthann Cancer, MD [facility] Radiology Electronically Signed   By: Ruthann Cancer MD   On: 08/13/2020 12:45   NM Hepatobiliary Liver Func  Result Date: 08/12/2020 CLINICAL DATA:  Recurrent biliary colic, cholelithiasis EXAM: NUCLEAR  MEDICINE HEPATOBILIARY IMAGING TECHNIQUE: Sequential images of the abdomen were obtained out to 60 minutes following intravenous administration of radiopharmaceutical. RADIOPHARMACEUTICALS:  5.5 mCi Tc-57m  Choletec IV COMPARISON:  None. FINDINGS: Prompt uptake and biliary excretion of activity by the liver is seen. Biliary activity passes into small bowel, consistent with patent common bile duct. The gallbladder is not visualized through 60 minutes. IMPRESSION: 1.  The common bile duct is patent. 2. The gallbladder is not visualized through 60 minutes, concerning for cystic duct obstruction and acute cholecystitis. Note that due to technologist error, the patient was not imaged through 120 minutes to definitively prove cystic duct obstruction. Technical repeat examination could be performed if desired based on equivocal clinical presentation. Electronically Signed   By: Eddie Candle M.D.   On: 08/12/2020 14:29    Anti-infectives: Anti-infectives (From admission, onward)    Start     Dose/Rate Route Frequency Ordered Stop   08/12/20 1700  cefTRIAXone (ROCEPHIN) 2 g in sodium chloride 0.9 % 100 mL IVPB  Status:  Discontinued        2 g 200 mL/hr over 30 Minutes Intravenous Every 24 hours 08/12/20 1605 08/13/20 1453        Assessment/Plan Acute cholecystitis POD1 S/P laparoscopic cholecystectomy w/ IOC - IOC negative for choledocholithiasis - AST/ALT elevated this AM - likely just reactive from cautery, will recheck as an outpatient prior to follow up  - incisions c/d/I  - diet as tolerated - mobilizing well - stable for discharge from a surgical standpoint   FEN: reg diet, IVF @75  cc/h per TRH VTE: SCDs, LMWH ID: rocephin 7/5>7/6   HTN Class III Obesity - BMI 47.83  LOS: 2 days    Norm Parcel, [facility] Surgery 08/14/2020, 10:07 AM Please see Amion for pager number during day hours 7:00am-4:30pm

## 2020-08-14 NOTE — Plan of Care (Signed)

## 2020-08-14 NOTE — Discharge Summary (Signed)
Physician Discharge Summary  Jennifer Booth OIN:[id number] DOB: February 23, 1985 DOA: 08/11/2020  PCP: Loyola Mast, PA-C  Admit date: 08/11/2020 Discharge date: 08/14/2020  Admitted From: Home Disposition: Home  Recommendations for Outpatient Follow-up:  Follow up with PCP in 1-2 weeks Please obtain LFTs in 1 week. Surgery office will schedule follow-up.  Home Health: Not applicable Equipment/Devices: Not applicable  Discharge Condition: Stable CODE STATUS: Full code Diet recommendation: Low-salt diet  Discharge summary: 35 year old female with history of morbid obesity and hypertension presented with right upper quadrant pain and nausea since 2 days.  Found to have symptomatic cholelithiasis and was planned for outpatient follow-up, however patient had severe pain so presented back to the hospital.  Admitted, underwent lap chole and intraoperative cholangiogram on 7/6 with negative for choledocholithiasis.  Today is day 1 postop.  Normal bowel function and pain controlled with oral pain medications.  As per surgery patient is discharged.  Patient had elevated transaminases today which is probably related to manipulation of cystic duct, bilirubin is normal.  Plan is to recheck LFTs in 1 week to ensure stabilization.  Patient will resume her amlodipine and hydrochlorothiazide on discharge.   Discharge Diagnoses:  Principal Problem:   Cholelithiasis with chronic cholecystitis Active Problems:   Biliary colic   Essential hypertension   Obesity, Class III, BMI 40-49.9 (morbid obesity) (HCC)   Nausea and vomiting    Discharge Instructions  Discharge Instructions     Call MD for:  redness, tenderness, or signs of infection (pain, swelling, redness, odor or green/yellow discharge around incision site)   Complete by: As directed    Call MD for:  severe uncontrolled pain   Complete by: As directed    Diet - low sodium heart healthy   Complete by: As directed    If the dressing is  still on your incision site when you go home, remove it on the third day after your surgery date. Remove dressing if it begins to fall off, or if it is dirty or damaged before the third day.   Complete by: As directed    Increase activity slowly   Complete by: As directed       Allergies as of 08/14/2020       Reactions   Ciprofloxacin Anaphylaxis   Zofran [ondansetron Hcl] Anaphylaxis        Medication List     TAKE these medications    acetaminophen 325 MG tablet Commonly known as: TYLENOL Take 2 tablets (650 mg total) by mouth every 6 (six) hours as needed for mild pain (or Fever >/= 101).   amLODipine 10 MG tablet Commonly known as: NORVASC Take 10 mg by mouth daily.   docusate sodium 100 MG capsule Commonly known as: COLACE Take 1 capsule (100 mg total) by mouth 2 (two) times daily as needed for mild constipation.   hydrochlorothiazide 12.5 MG tablet Commonly known as: HYDRODIURIL Take 12.5 mg by mouth every morning.   naproxen 500 MG tablet Commonly known as: NAPROSYN Take 500 mg by mouth 2 (two) times daily as needed.   oxyCODONE 5 MG immediate release tablet Commonly known as: Oxy IR/ROXICODONE Take 1-2 tablets (5-10 mg total) by mouth every 4 (four) hours as needed for moderate pain.   promethazine 25 MG tablet Commonly known as: PHENERGAN Take 1 tablet (25 mg total) by mouth every 6 (six) hours as needed for nausea or vomiting.               Discharge Care  Instructions  (From admission, onward)           Start     Ordered   08/14/20 0000  If the dressing is still on your incision site when you go home, remove it on the third day after your surgery date. Remove dressing if it begins to fall off, or if it is dirty or damaged before the third day.        08/14/20 [facility], [place]. Schedule an appointment as soon as possible for a visit in 3 week(s).   Specialty: General Surgery Why:  Our office is scheduling a follow up appointment in 3-4 weeks. Please call to confirm appointment date/time. Please arrive 30 min prior to appointment time for check in. Contact information: [address] [place] [address] [phone]                Allergies  Allergen Reactions   Ciprofloxacin Anaphylaxis   Zofran [Ondansetron Hcl] Anaphylaxis    Consultations: General surgery   Procedures/Studies: DG Cholangiogram Operative  Result Date: 08/13/2020 CLINICAL DATA:  35 year old female with history of acute calculus cholecystitis. EXAM: INTRAOPERATIVE CHOLANGIOGRAM TECHNIQUE: Cholangiographic images from the C-arm fluoroscopic device were submitted for interpretation post-operatively. Please see the procedural report for the amount of contrast and the fluoroscopy time utilized. COMPARISON:  None. FINDINGS: Intraoperative antegrade injection via the cystic duct which flows retrograde into the gallbladder fossa and along the inferior hepatic capsule. The remainder of the contrast Opacifies the common bile duct and central portions of the intrahepatic biliary tree. Contrast is visualized flowing freely into the duodenum. There are no filling defects. No significant intra or extrahepatic biliary ductal dilation. No apparent anomalous anatomical configuration of the biliary tree. IMPRESSION: 1. No evidence of choledocholithiasis. 2. There is spillage of contrast material into the gallbladder fossa, likely secondary to suboptimal position/poor seal within the remnant cystic duct of the catheter through which contrast is injected. If there is clinical concern for biloma, recommend repeat HIDA scan. Ruthann Cancer, MD [facility] Electronically Signed   By: Ruthann Cancer MD   On: 08/13/2020 12:45   NM Hepatobiliary Liver Func  Result Date: 08/12/2020 CLINICAL DATA:  Recurrent biliary colic, cholelithiasis EXAM: NUCLEAR  MEDICINE HEPATOBILIARY IMAGING TECHNIQUE: Sequential images of the abdomen were obtained out to 60 minutes following intravenous administration of radiopharmaceutical. RADIOPHARMACEUTICALS:  5.5 mCi Tc-69m  Choletec IV COMPARISON:  None. FINDINGS: Prompt uptake and biliary excretion of activity by the liver is seen. Biliary activity passes into small bowel, consistent with patent common bile duct. The gallbladder is not visualized through 60 minutes. IMPRESSION: 1.  The common bile duct is patent. 2. The gallbladder is not visualized through 60 minutes, concerning for cystic duct obstruction and acute cholecystitis. Note that due to technologist error, the patient was not imaged through 120 minutes to definitively prove cystic duct obstruction. Technical repeat examination could be performed if desired based on equivocal clinical presentation. Electronically Signed   By: Eddie Candle M.D.   On: 08/12/2020 14:29   US Abdomen Limited RUQ (LIVER/GB)  Result Date: 08/11/2020 CLINICAL DATA:  Epigastric pain and nausea and vomiting for 2 days. EXAM: ULTRASOUND ABDOMEN LIMITED RIGHT UPPER QUADRANT COMPARISON:  None. FINDINGS: Gallbladder: Distended, but with a thin wall and no pericholecystic fluid or sonographic Murphy's sign. There are dependent stones, largest measuring 1  cm. Common bile duct: Diameter: 3 mm Liver: Coarse, heterogeneous echotexture with diffusely increased echogenicity. No mass or focal lesion. Normal size. Portal vein is patent on color Doppler imaging with normal direction of blood flow towards the liver. Other: None. IMPRESSION: 1. No acute findings. 2. Cholelithiasis without evidence of acute cholecystitis. 3. Hepatic steatosis. Electronically Signed   By: Lajean Manes M.D.   On: 08/11/2020 10:22   (Echo, Carotid, EGD, Colonoscopy, ERCP)    Subjective: Patient seen and examined.  Mild pain persist.  Denies any nausea vomiting.  Ready to go home.   Discharge Exam: Vitals:   08/14/20 0613  08/14/20 0931  BP: (!) 97/57 (!) 159/90  Pulse: 80 94  Resp: 18 18  Temp: 98.2 F (36.8 C) 97.7 F (36.5 C)  SpO2: 100% 98%   Vitals:   08/14/20 0205 08/14/20 0330 08/14/20 0613 08/14/20 0931  BP: (!) 98/59 128/83 (!) 97/57 (!) 159/90  Pulse: 82  80 94  Resp: 18 18 18 18   Temp: 98 F (36.7 C)  98.2 F (36.8 C) 97.7 F (36.5 C)  TempSrc: Oral  Oral Oral  SpO2: 98%  100% 98%  Weight:      Height:        General: Pt is alert, awake, not in acute distress Cardiovascular: RRR, S1/S2 +, no rubs, no gallops Respiratory: CTA bilaterally, no wheezing, no rhonchi Abdominal: Soft, mild tenderness along the ports.  Otherwise abdomen is soft and nontender. Extremities: no edema, no cyanosis    The results of significant diagnostics from this hospitalization (including imaging, microbiology, ancillary and laboratory) are listed below for reference.     Microbiology: Recent Results (from the past 240 hour(s))  Resp Panel by RT-PCR (Flu A&B, Covid) Nasopharyngeal Swab     Status: None   Collection Time: 08/12/20  1:11 AM   Specimen: Nasopharyngeal Swab; Nasopharyngeal(NP) swabs in vial transport medium  Result Value Ref Range Status   SARS Coronavirus 2 by RT PCR NEGATIVE NEGATIVE Final    Comment: (NOTE) SARS-CoV-2 target nucleic acids are NOT DETECTED.  The SARS-CoV-2 RNA is generally detectable in upper respiratory specimens during the acute phase of infection. The lowest concentration of SARS-CoV-2 viral copies this assay can detect is 138 copies/mL. A negative result does not preclude SARS-Cov-2 infection and should not be used as the sole basis for treatment or other patient management decisions. A negative result may occur with  improper specimen collection/handling, submission of specimen other than nasopharyngeal swab, presence of viral mutation(s) within the areas targeted by this assay, and inadequate number of viral copies(<138 copies/mL). A negative result must be  combined with clinical observations, patient history, and epidemiological information. The expected result is Negative.  Fact Sheet for Patients:  EntrepreneurPulse.com.au  Fact Sheet for Healthcare Providers:  IncredibleEmployment.be  This test is no t yet approved or cleared by the [place] FDA and  has been authorized for detection and/or diagnosis of SARS-CoV-2 by FDA under an Emergency Use Authorization (EUA). This EUA will remain  in effect (meaning this test can be used) for the duration of the COVID-19 declaration under Section 564(b)(1) of the Act, 21 U.S.C.section 360bbb-3(b)(1), unless the authorization is terminated  or revoked sooner.       Influenza A by PCR NEGATIVE NEGATIVE Final   Influenza B by PCR NEGATIVE NEGATIVE Final    Comment: (NOTE) The Xpert Xpress SARS-CoV-2/FLU/RSV plus assay is intended as an aid in the diagnosis of influenza from Nasopharyngeal swab specimens  and should not be used as a sole basis for treatment. Nasal washings and aspirates are unacceptable for Xpert Xpress SARS-CoV-2/FLU/RSV testing.  Fact Sheet for Patients: EntrepreneurPulse.com.au  Fact Sheet for Healthcare Providers: IncredibleEmployment.be  This test is not yet approved or cleared by the [place] FDA and has been authorized for detection and/or diagnosis of SARS-CoV-2 by FDA under an Emergency Use Authorization (EUA). This EUA will remain in effect (meaning this test can be used) for the duration of the COVID-19 declaration under Section 564(b)(1) of the Act, 21 U.S.C. section 360bbb-3(b)(1), unless the authorization is terminated or revoked.  Performed at [facility], [place] [address] [place], [place] [address]      Labs: BNP (last 3 results) No results for input(s): BNP in the last 8760 hours. Basic Metabolic Panel: Recent Labs  Lab 08/11/20 0848 08/11/20 2300  08/12/20 0600 08/13/20 0304 08/14/20 0423  NA 137 140 140 139 137  K 3.3* 3.5 3.8 3.5 4.1  CL 105 103 104 102 104  CO2 24 29 28 30 26   GLUCOSE 114* 89 85 106* 167*  BUN 12 16 12 15 16   CREATININE 1.11* 1.22* 1.05*  1.08* 1.04* 0.89  CALCIUM 8.6* 8.8* 8.4* 8.6* 8.5*   Liver Function Tests: Recent Labs  Lab 08/11/20 0848 08/11/20 2300 08/13/20 0304 08/14/20 0423  AST 22 19 21  649*  ALT 19 15 19  343*  ALKPHOS 72 74 65 120  BILITOT 0.7 0.2* 0.3 0.4  PROT 5.9* 6.2* 6.0* 6.4*  ALBUMIN 3.2* 3.3* 3.1* 3.3*   Recent Labs  Lab 08/11/20 0848 08/11/20 2300  LIPASE 29 35   No results for input(s): AMMONIA in the last 168 hours. CBC: Recent Labs  Lab 08/11/20 0848 08/11/20 2300 08/12/20 0600 08/13/20 0304 08/14/20 0423  WBC 6.3 7.9 7.1 5.3 9.5  NEUTROABS  --  3.0  --   --  8.1*  HGB 11.6* 10.6* 11.1* 10.4* 10.3*  HCT 34.9* 32.3* 34.0* 31.9* 31.1*  MCV 94.8 95.8 96.9 96.1 95.1  PLT 328 330 326 312 331   Cardiac Enzymes: No results for input(s): CKTOTAL, CKMB, CKMBINDEX, TROPONINI in the last 168 hours. BNP: Invalid input(s): POCBNP CBG: No results for input(s): GLUCAP in the last 168 hours. D-Dimer No results for input(s): DDIMER in the last 72 hours. Hgb A1c No results for input(s): HGBA1C in the last 72 hours. Lipid Profile No results for input(s): CHOL, HDL, LDLCALC, TRIG, CHOLHDL, LDLDIRECT in the last 72 hours. Thyroid function studies No results for input(s): TSH, T4TOTAL, T3FREE, THYROIDAB in the last 72 hours.  Invalid input(s): FREET3 Anemia work up No results for input(s): VITAMINB12, FOLATE, FERRITIN, TIBC, IRON, RETICCTPCT in the last 72 hours. Urinalysis    Component Value Date/Time   COLORURINE YELLOW 08/11/2020 0845   APPEARANCEUR CLEAR 08/11/2020 0845   LABSPEC 1.020 08/11/2020 0845   PHURINE 5.0 08/11/2020 0845   GLUCOSEU NEGATIVE 08/11/2020 0845   HGBUR NEGATIVE 08/11/2020 0845   BILIRUBINUR NEGATIVE 08/11/2020 0845   KETONESUR NEGATIVE  08/11/2020 0845   PROTEINUR NEGATIVE 08/11/2020 0845   UROBILINOGEN 0.2 01/16/2020 1729   NITRITE NEGATIVE 08/11/2020 0845   LEUKOCYTESUR NEGATIVE 08/11/2020 0845   Sepsis Labs Invalid input(s): PROCALCITONIN,  WBC,  LACTICIDVEN Microbiology Recent Results (from the past 240 hour(s))  Resp Panel by RT-PCR (Flu A&B, Covid) Nasopharyngeal Swab     Status: None   Collection Time: 08/12/20  1:11 AM   Specimen: Nasopharyngeal Swab; Nasopharyngeal(NP) swabs in vial transport medium  Result Value Ref Range Status   SARS Coronavirus 2 by RT PCR NEGATIVE NEGATIVE Final    Comment: (NOTE) SARS-CoV-2 target nucleic acids are NOT DETECTED.  The SARS-CoV-2 RNA is generally detectable in upper respiratory specimens during the acute phase of infection. The lowest concentration of SARS-CoV-2 viral copies this assay can detect is 138 copies/mL. A negative result does not preclude SARS-Cov-2 infection and should not be used as the sole basis for treatment or other patient management decisions. A negative result may occur with  improper specimen collection/handling, submission of specimen other than nasopharyngeal swab, presence of viral mutation(s) within the areas targeted by this assay, and inadequate number of viral copies(<138 copies/mL). A negative result must be combined with clinical observations, patient history, and epidemiological information. The expected result is Negative.  Fact Sheet for Patients:  EntrepreneurPulse.com.au  Fact Sheet for Healthcare Providers:  IncredibleEmployment.be  This test is no t yet approved or cleared by the [place] FDA and  has been authorized for detection and/or diagnosis of SARS-CoV-2 by FDA under an Emergency Use Authorization (EUA). This EUA will remain  in effect (meaning this test can be used) for the duration of the COVID-19 declaration under Section 564(b)(1) of the Act, 21 U.S.C.section 360bbb-3(b)(1),  unless the authorization is terminated  or revoked sooner.       Influenza A by PCR NEGATIVE NEGATIVE Final   Influenza B by PCR NEGATIVE NEGATIVE Final    Comment: (NOTE) The Xpert Xpress SARS-CoV-2/FLU/RSV plus assay is intended as an aid in the diagnosis of influenza from Nasopharyngeal swab specimens and should not be used as a sole basis for treatment. Nasal washings and aspirates are unacceptable for Xpert Xpress SARS-CoV-2/FLU/RSV testing.  Fact Sheet for Patients: EntrepreneurPulse.com.au  Fact Sheet for Healthcare Providers: IncredibleEmployment.be  This test is not yet approved or cleared by the [place] FDA and has been authorized for detection and/or diagnosis of SARS-CoV-2 by FDA under an Emergency Use Authorization (EUA). This EUA will remain in effect (meaning this test can be used) for the duration of the COVID-19 declaration under Section 564(b)(1) of the Act, 21 U.S.C. section 360bbb-3(b)(1), unless the authorization is terminated or revoked.  Performed at [facility], [place] [address], [place],  [address]      Time coordinating discharge: 28 minutes  SIGNED:   Barb Merino, MD  [facility] 08/14/2020, 10:45 AM

## 2020-08-14 NOTE — Progress Notes (Signed)
Patient c/o dizziness. B/p has dropped from 123/83 to 98/59. On call MD contacted, stated to give 500 ml bolus of NS.

## 2020-08-14 NOTE — TOC Transition Note (Signed)
Transition of Care [facility]) - CM/SW Discharge Note   Patient Details  Name: Jennifer Booth MRN: [id number] Date of Birth: 1985/03/01  [facility] CM/SW Contact:  Leeroy Cha, RN Phone Number: 08/14/2020, 2:11 PM   Clinical Narrative:    Dcd to home no toc needs present    Final next level of care: Home/Self Care Barriers to Discharge: No Barriers Identified   Patient Goals and CMS Choice        Discharge Placement                       Discharge Plan and Services                                     Social Determinants of Health (SDOH) Interventions     Readmission Risk Interventions No flowsheet data found.

## 2020-08-15 ENCOUNTER — Telehealth: Payer: Self-pay

## 2020-08-15 NOTE — Telephone Encounter (Signed)
Transition Care Management Follow-up Telephone Call Date of discharge and from where: 08/14/2020- [facility] How have you been since you were released from the hospital? Feel much better Any questions or concerns? No  Items Reviewed: Did the pt receive and understand the discharge instructions provided? Yes  Medications obtained and verified? Yes  Other? No  Any new allergies since your discharge? No  Dietary orders reviewed? No Do you have support at home? Yes   Home Care and Equipment/Supplies: Were home health services ordered? not applicable If so, what is the name of the agency? N/A  Has the agency set up a time to come to the patient's home? not applicable Were any new equipment or medical supplies ordered?  No What is the name of the medical supply agency? N/A Were you able to get the supplies/equipment? not applicable Do you have any questions related to the use of the equipment or supplies? No  Functional Questionnaire: (I = Independent and D = Dependent) ADLs: I  Bathing/Dressing- I  Meal Prep- I  Eating- I  Maintaining continence- I  Transferring/Ambulation- I  Managing Meds- I  Follow up appointments reviewed:  PCP Hospital f/u appt confirmed? Yes  Scheduled to see Dr.Moreira on 08/18/2020 @ 8:45. [facility] f/u appt confirmed? Yes  Scheduled to see Dr. Freda Munro on 08/18/2020 @ 11:45. Are transportation arrangements needed? No  If their condition worsens, is the pt aware to call PCP or go to the Emergency Dept.? Yes Was the patient provided with contact information for the PCP's office or ED? Yes Was to pt encouraged to call back with questions or concerns? Yes

## 2020-08-15 NOTE — Anesthesia Postprocedure Evaluation (Signed)
Anesthesia Post Note  Patient: Jennifer Booth  Procedure(s) Performed: LAPAROSCOPIC CHOLECYSTECTOMY WITH INTRAOPERATIVE CHOLANGIOGRAM     Patient location during evaluation: PACU Anesthesia Type: General Level of consciousness: awake and sedated Pain management: pain level controlled Vital Signs Assessment: post-procedure vital signs reviewed and stable Respiratory status: spontaneous breathing Cardiovascular status: stable Postop Assessment: no apparent nausea or vomiting Anesthetic complications: no   No notable events documented.  Last Vitals:  Vitals:   08/14/20 0613 08/14/20 0931  BP: (!) 97/57 (!) 159/90  Pulse: 80 94  Resp: 18 18  Temp: 36.8 C 36.5 C  SpO2: 100% 98%    Last Pain:  Vitals:   08/14/20 1218  TempSrc:   PainSc: 2                  Huston Foley

## 2020-08-19 DIAGNOSIS — R110 Nausea: Secondary | ICD-10-CM | POA: Diagnosis not present

## 2020-08-19 DIAGNOSIS — Z9049 Acquired absence of other specified parts of digestive tract: Secondary | ICD-10-CM | POA: Diagnosis not present

## 2020-08-19 DIAGNOSIS — K219 Gastro-esophageal reflux disease without esophagitis: Secondary | ICD-10-CM | POA: Diagnosis not present

## 2020-08-22 ENCOUNTER — Encounter: Payer: Self-pay | Admitting: Obstetrics

## 2020-08-22 ENCOUNTER — Other Ambulatory Visit (HOSPITAL_COMMUNITY)
Admission: RE | Admit: 2020-08-22 | Discharge: 2020-08-22 | Disposition: A | Discharge status: Home / Self Care | Payer: Medicaid Other | Source: Ambulatory Visit | Attending: Obstetrics | Admitting: Obstetrics

## 2020-08-22 ENCOUNTER — Other Ambulatory Visit: Payer: Self-pay

## 2020-08-22 ENCOUNTER — Ambulatory Visit (INDEPENDENT_AMBULATORY_CARE_PROVIDER_SITE_OTHER): Payer: Medicaid Other | Admitting: Obstetrics

## 2020-08-22 VITALS — BP 135/100 | HR 91 | Ht 63.0 in | Wt 282.6 lb

## 2020-08-22 DIAGNOSIS — N946 Dysmenorrhea, unspecified: Secondary | ICD-10-CM

## 2020-08-22 DIAGNOSIS — Z6841 Body Mass Index (BMI) 40.0 and over, adult: Secondary | ICD-10-CM | POA: Diagnosis not present

## 2020-08-22 DIAGNOSIS — N898 Other specified noninflammatory disorders of vagina: Secondary | ICD-10-CM | POA: Insufficient documentation

## 2020-08-22 DIAGNOSIS — Z113 Encounter for screening for infections with a predominantly sexual mode of transmission: Secondary | ICD-10-CM | POA: Diagnosis not present

## 2020-08-22 DIAGNOSIS — N870 Mild cervical dysplasia: Secondary | ICD-10-CM

## 2020-08-22 DIAGNOSIS — Z01419 Encounter for gynecological examination (general) (routine) without abnormal findings: Secondary | ICD-10-CM | POA: Insufficient documentation

## 2020-08-22 MED ORDER — IBUPROFEN 800 MG PO TABS
800.0000 mg | ORAL_TABLET | Freq: Three times a day (TID) | ORAL | 5 refills | Status: DC | PRN
Start: 2020-08-22 — End: 2021-02-25

## 2020-08-22 NOTE — Progress Notes (Signed)
Subjective:        Jennifer Booth is a 35 y.o. female here for a routine exam.  Current complaints: Periods have been heavier and more painful since getting a tubal..    Personal health questionnaire:  Is patient Ashkenazi Jewish, have a family history of breast and/or ovarian cancer: no Is there a family history of uterine cancer diagnosed at age < 71, gastrointestinal cancer, urinary tract cancer, family member who is a Field seismologist syndrome-associated carrier: no Is the patient overweight and hypertensive, family history of diabetes, personal history of gestational diabetes, preeclampsia or PCOS: no Is patient over 35, have PCOS,  family history of premature CHD under age 63, diabetes, smoke, have hypertension or peripheral artery disease:  no At any time, has a partner hit, kicked or otherwise hurt or frightened you?: no Over the past 2 weeks, have you felt down, depressed or hopeless?: no Over the past 2 weeks, have you felt little interest or pleasure in doing things?:no   Gynecologic History Patient's last menstrual period was 07/25/2020. Contraception: tubal ligation Last Pap: 04-19-2018. Results were: ASCUS with positive HRHPV.  Cplpo:  CIN 1 Last mammogram: n/a. Results were: n/a  Obstetric History OB History  Gravida Para Term Preterm AB Living  4 4 3 1  0 5  SAB IAB Ectopic Multiple Live Births  0 0 0 1 5    # Outcome Date GA Lbr Len/2nd Weight Sex Delivery Anes PTL Lv  4 Term 06/01/18 [redacted]w[redacted]d 24:32 / 00:13 5 lb 11.5 oz (2.594 kg) F VBAC EPI  LIV  3A Preterm 08/06/16 [redacted]w[redacted]d    CS-LTranv   LIV     Complications: Preeclampsia  3B Preterm 08/06/16 [redacted]w[redacted]d    CS-LTranv     2 Term 2016 [redacted]w[redacted]d    Vag-Spont   LIV     Complications: Preeclampsia, Group B streptococcal infection  1 Term 2007 [redacted]w[redacted]d    Vag-Spont   LIV    Past Medical History:  Diagnosis Date   Anemia    Pre-eclampsia 2018    Past Surgical History:  Procedure Laterality Date   CESAREAN SECTION      CHOLECYSTECTOMY N/A 08/13/2020   Procedure: LAPAROSCOPIC CHOLECYSTECTOMY WITH INTRAOPERATIVE CHOLANGIOGRAM;  Surgeon: Armandina Gemma, MD;  Location: [facility];  Service: General;  Laterality: N/A;   LAPAROSCOPIC TUBAL LIGATION Bilateral 08/02/2018   Procedure: LAPAROSCOPIC TUBAL LIGATION WITH FILSHIE CLIPS;  Surgeon: Sloan Leiter, MD;  Location: [facility];  Service: Gynecology;  Laterality: Bilateral;     Current Outpatient Medications:    acetaminophen (TYLENOL) 325 MG tablet, Take 2 tablets (650 mg total) by mouth every 6 (six) hours as needed for mild pain (or Fever >/= 101)., Disp: , Rfl:    amLODipine (NORVASC) 10 MG tablet, Take 10 mg by mouth daily., Disp: , Rfl:    hydrochlorothiazide (HYDRODIURIL) 12.5 MG tablet, Take 12.5 mg by mouth every morning., Disp: , Rfl:    ibuprofen (ADVIL) 800 MG tablet, Take 1 tablet (800 mg total) by mouth every 8 (eight) hours as needed., Disp: 30 tablet, Rfl: 5   promethazine (PHENERGAN) 25 MG tablet, Take 1 tablet (25 mg total) by mouth every 6 (six) hours as needed for nausea or vomiting., Disp: 12 tablet, Rfl: 0 Allergies  Allergen Reactions   Ciprofloxacin Anaphylaxis   Zofran [Ondansetron Hcl] Anaphylaxis    Social History   Tobacco Use   Smoking status: Never   Smokeless tobacco: Never  Substance Use Topics   Alcohol  use: Not Currently    Family History  Problem Relation Age of Onset   Hypertension Mother    Diabetes Mother    Hypercholesterolemia Father    Hypertension Father    Heart disease Maternal Grandmother    Diabetes Maternal Grandmother       Review of Systems  Constitutional: negative for fatigue and weight loss Respiratory: negative for cough and wheezing Cardiovascular: negative for chest pain, fatigue and palpitations Gastrointestinal: negative for abdominal pain and change in bowel habits Musculoskeletal:negative for myalgias Neurological: negative for gait problems and tremors Behavioral/Psych:  negative for abusive relationship, depression Endocrine: negative for temperature intolerance    Genitourinary:positive for heavy and painful menstrual periods.  Negative for genital lesions, hot flashes, sexual problems and vaginal discharge Integument/breast: negative for breast lump, breast tenderness, nipple discharge and skin lesion(s)    Objective:       BP (!) 135/100   Pulse 91   Ht 5\' 3"  (1.6 m)   Wt 282 lb 9.6 oz (128.2 kg)   LMP 07/25/2020   BMI 50.06 kg/m  General:   Alert and no distress  Skin:   no rash or abnormalities  Lungs:   clear to auscultation bilaterally  Heart:   regular rate and rhythm, S1, S2 normal, no murmur, click, rub or gallop  Breasts:   normal without suspicious masses, skin or nipple changes or axillary nodes  Abdomen:  normal findings: no organomegaly, soft, non-tender and no hernia  Pelvis:  External genitalia: normal general appearance Urinary system: urethral meatus normal and bladder without fullness, nontender Vaginal: normal without tenderness, induration or masses Cervix: normal appearance Adnexa: normal bimanual exam Uterus: anteverted and non-tender, normal size   Lab Review Urine pregnancy test Labs reviewed yes Radiologic studies reviewed no  I have spent a total of 20 minutes of face-to-face time, excluding clinical staff time, reviewing notes and preparing to see patient, ordering tests and/or medications, and counseling the patient.   Assessment:    1. Encounter for gynecological examination with Papanicolaou smear of cervix Rx: - Cytology - PAP( [facility])  2. Vaginal discharge Rx: - Cervicovaginal ancillary only  3. Screening for STD (sexually transmitted disease) Rx: - Hepatitis B surface antigen - Hepatitis C antibody - HIV Antibody (routine testing w rflx) - RPR  4. Dysmenorrhea Rx: - ibuprofen (ADVIL) 800 MG tablet; Take 1 tablet (800 mg total) by mouth every 8 (eight) hours as needed.  Dispense: 30  tablet; Refill: 5  5. Mild dysplasia of cervix (CIN I) - repeat pap in 1 year  6. Class 3 severe obesity due to excess calories without serious comorbidity with body mass index (BMI) of 50.0 to 59.9 in adult [facility])     Plan:    Education reviewed: calcium supplements, depression evaluation, low fat, low cholesterol diet, safe sex/STD prevention, self breast exams, skin cancer screening, and weight bearing exercise. Follow up in: 1 year.   Meds ordered this encounter  Medications   ibuprofen (ADVIL) 800 MG tablet    Sig: Take 1 tablet (800 mg total) by mouth every 8 (eight) hours as needed.    Dispense:  30 tablet    Refill:  5   Orders Placed This Encounter  Procedures   Hepatitis B surface antigen   Hepatitis C antibody   HIV Antibody (routine testing w rflx)   RPR     Shelly Bombard, MD 08/22/2020 9:14 AM

## 2020-08-22 NOTE — Progress Notes (Signed)
Pt is in the office for annual Last pap 04-19-2018

## 2020-08-22 NOTE — Progress Notes (Signed)
Patient presents for AEX. Patient desires to have all STD testing completed. Patient has no other concerns.  Last Pap: 04/19/2018 ASCUS and +HPV  Colpo 09/27/2018: CIN 1

## 2020-08-23 LAB — HEPATITIS B SURFACE ANTIGEN: Hepatitis B Surface Ag: NEGATIVE

## 2020-08-23 LAB — RPR: RPR Ser Ql: NONREACTIVE

## 2020-08-23 LAB — HEPATITIS C ANTIBODY: Hep C Virus Ab: <0.1 s/co ratio (ref 0.0–0.9)

## 2020-08-23 LAB — HIV ANTIBODY (ROUTINE TESTING W REFLEX): HIV Screen 4th Generation wRfx: NONREACTIVE

## 2020-08-25 LAB — CERVICOVAGINAL ANCILLARY ONLY
Bacterial Vaginitis (gardnerella): POSITIVE — AB
Candida Glabrata: NEGATIVE
Candida Vaginitis: NEGATIVE
Chlamydia: NEGATIVE
Comment: NEGATIVE
Comment: NEGATIVE
Comment: NEGATIVE
Comment: NEGATIVE
Comment: NEGATIVE
Comment: NORMAL
Neisseria Gonorrhea: NEGATIVE
Trichomonas: NEGATIVE

## 2020-08-28 ENCOUNTER — Other Ambulatory Visit: Payer: Self-pay

## 2020-08-28 DIAGNOSIS — N760 Acute vaginitis: Secondary | ICD-10-CM

## 2020-08-28 DIAGNOSIS — B9689 Other specified bacterial agents as the cause of diseases classified elsewhere: Secondary | ICD-10-CM

## 2020-08-28 MED ORDER — METRONIDAZOLE 500 MG PO TABS: 500.0000 mg | ORAL_TABLET | Freq: Two times a day (BID) | ORAL | 0 refills | Status: DC

## 2020-08-29 LAB — CYTOLOGY - PAP
Comment: NEGATIVE
Comment: NEGATIVE
Diagnosis: UNDETERMINED — AB
HPV 16: NEGATIVE
HPV 18 / 45: NEGATIVE
High risk HPV: POSITIVE — AB

## 2020-09-01 ENCOUNTER — Other Ambulatory Visit: Payer: Self-pay | Admitting: Obstetrics

## 2020-09-24 ENCOUNTER — Ambulatory Visit (INDEPENDENT_AMBULATORY_CARE_PROVIDER_SITE_OTHER): Payer: Medicaid Other | Admitting: Obstetrics

## 2020-09-24 ENCOUNTER — Encounter: Payer: Self-pay | Admitting: Obstetrics

## 2020-09-24 ENCOUNTER — Other Ambulatory Visit: Payer: Self-pay

## 2020-09-24 VITALS — BP 160/108 | HR 86 | Wt 281.4 lb

## 2020-09-24 DIAGNOSIS — Z6841 Body Mass Index (BMI) 40.0 and over, adult: Secondary | ICD-10-CM

## 2020-09-24 DIAGNOSIS — R87610 Atypical squamous cells of undetermined significance on cytologic smear of cervix (ASC-US): Secondary | ICD-10-CM

## 2020-09-24 DIAGNOSIS — N979 Female infertility, unspecified: Secondary | ICD-10-CM | POA: Diagnosis not present

## 2020-09-24 DIAGNOSIS — R87810 Cervical high risk human papillomavirus (HPV) DNA test positive: Secondary | ICD-10-CM | POA: Diagnosis not present

## 2020-09-24 DIAGNOSIS — Z3169 Encounter for other general counseling and advice on procreation: Secondary | ICD-10-CM | POA: Diagnosis not present

## 2020-09-24 DIAGNOSIS — Z01812 Encounter for preprocedural laboratory examination: Secondary | ICD-10-CM

## 2020-09-24 MED ORDER — VITAFOL ULTRA 29-0.6-0.4-200 MG PO CAPS: 1.0000 | ORAL_CAPSULE | Freq: Every day | ORAL | 4 refills | Status: DC

## 2020-09-24 NOTE — Progress Notes (Signed)
Patient ID: Jennifer Booth, female   DOB: 01-05-1986, 35 y.o.   MRN: [id number]  Chief Complaint  Patient presents with   Colposcopy    HPI Jennifer Booth is a 35 y.o. female.  Presents for colposcopy for ASCUS with positive HRHPV.  However, she had colposcopy done a year ago that revealed LGSIL.  Therefore, no need for repeat colposcopy today.  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  She recently remarried and would like to conceive. HPI  Past Medical History:  Diagnosis Date   Anemia    Pre-eclampsia 2018    Past Surgical History:  Procedure Laterality Date   CESAREAN SECTION     CHOLECYSTECTOMY N/A 08/13/2020   Procedure: LAPAROSCOPIC CHOLECYSTECTOMY WITH INTRAOPERATIVE CHOLANGIOGRAM;  Surgeon: Armandina Gemma, MD;  Location: [facility];  Service: General;  Laterality: N/A;   LAPAROSCOPIC TUBAL LIGATION Bilateral 08/02/2018   Procedure: LAPAROSCOPIC TUBAL LIGATION WITH FILSHIE CLIPS;  Surgeon: Sloan Leiter, MD;  Location: [facility];  Service: Gynecology;  Laterality: Bilateral;    Family History  Problem Relation Age of Onset   Hypertension Mother    Diabetes Mother    Hypercholesterolemia Father    Hypertension Father    Heart disease Maternal Grandmother    Diabetes Maternal Grandmother     Social History Social History   Tobacco Use   Smoking status: Never   Smokeless tobacco: Never  Vaping Use   Vaping Use: Never used  Substance Use Topics   Alcohol use: Not Currently   Drug use: Never    Allergies  Allergen Reactions   Ciprofloxacin Anaphylaxis   Zofran [Ondansetron Hcl] Anaphylaxis    Current Outpatient Medications  Medication Sig Dispense Refill   amLODipine (NORVASC) 10 MG tablet Take 10 mg by mouth daily.     hydrochlorothiazide (HYDRODIURIL) 12.5 MG tablet Take 12.5 mg by mouth every morning.     ibuprofen (ADVIL) 800 MG tablet Take 1 tablet (800 mg total) by mouth every 8 (eight) hours as  needed. 30 tablet 5   Prenat-Fe Poly-Methfol-FA-DHA (VITAFOL ULTRA) 29-0.6-0.4-200 MG CAPS Take 1 capsule by mouth daily before breakfast. 90 capsule 4   acetaminophen (TYLENOL) 325 MG tablet Take 2 tablets (650 mg total) by mouth every 6 (six) hours as needed for mild pain (or Fever >/= 101). (Patient not taking: Reported on 09/24/2020)     metroNIDAZOLE (FLAGYL) 500 MG tablet Take 1 tablet (500 mg total) by mouth 2 (two) times daily. 14 tablet 0   promethazine (PHENERGAN) 25 MG tablet Take 1 tablet (25 mg total) by mouth every 6 (six) hours as needed for nausea or vomiting. (Patient not taking: Reported on 09/24/2020) 12 tablet 0   No current facility-administered medications for this visit.    Review of Systems Review of Systems Constitutional: negative for fatigue and weight loss Respiratory: negative for cough and wheezing Cardiovascular: negative for chest pain, fatigue and palpitations Gastrointestinal: negative for abdominal pain and change in bowel habits Genitourinary:negative Integument/breast: negative for nipple discharge Musculoskeletal:negative for myalgias Neurological: negative for gait problems and tremors Behavioral/Psych: negative for abusive relationship, depression Endocrine: negative for temperature intolerance      Blood pressure (!) 160/108, pulse 86, weight 281 lb 6.4 oz (127.6 kg), last menstrual period 09/20/2020, currently breastfeeding.  Physical Exam Physical Exam:  Deferred  I have spent a total of 10 minutes of face-to-face time, excluding clinical staff time, reviewing notes and preparing to see patient, ordering tests and/or medications,  and counseling the patient.   Data Reviewed Previous pap smear and colposcopy  Assessment     1. ASCUS with positive high risk HPV cervical Rx: - POCT urine pregnancy  2. Infertility, female, secondary, from tubal ligation Rx: - Ambulatory referral to Endocrinology  3. Encounter for preconception  consultation - lifestyle changes discussed with increased fluids, fiber and exercise, along with the importance of folic acid Rx: - Prenat-Fe Poly-Methfol-FA-DHA (VITAFOL ULTRA) 29-0.6-0.4-200 MG CAPS; Take 1 capsule by mouth daily before breakfast.  Dispense: 90 capsule; Refill: 4  4. Class 3 severe obesity due to excess calories without serious comorbidity with body mass index (BMI) of 50.0 to 59.9 in adult (HCC) - weight reduction from dietary changes, exercise, and behavioral modification recommended     Plan   Follow up in 1 year  Orders Placed This Encounter  Procedures   Ambulatory referral to Endocrinology    Referral Priority:   Routine    Referral Type:   Consultation    Referral Reason:   Specialty Services Required    Number of Visits Requested:   1   POCT urine pregnancy   Meds ordered this encounter  Medications   Prenat-Fe Poly-Methfol-FA-DHA (VITAFOL ULTRA) 29-0.6-0.4-200 MG CAPS    Sig: Take 1 capsule by mouth daily before breakfast.    Dispense:  90 capsule    Refill:  4       Shelly Bombard, MD 09/24/2020 10:31 AM

## 2020-09-24 NOTE — Progress Notes (Signed)
Pt presents for colposcopy s/p ASCUS +HRHPV pap on 08/22/20. UPT negative  Elevated BP - pt did not take BP meds this morning

## 2020-10-03 DIAGNOSIS — E1165 Type 2 diabetes mellitus with hyperglycemia: Secondary | ICD-10-CM | POA: Diagnosis not present

## 2020-10-03 DIAGNOSIS — E1142 Type 2 diabetes mellitus with diabetic polyneuropathy: Secondary | ICD-10-CM | POA: Diagnosis not present

## 2020-10-03 DIAGNOSIS — Z1211 Encounter for screening for malignant neoplasm of colon: Secondary | ICD-10-CM | POA: Diagnosis not present

## 2020-10-03 DIAGNOSIS — M25512 Pain in left shoulder: Secondary | ICD-10-CM | POA: Diagnosis not present

## 2020-10-03 DIAGNOSIS — Z124 Encounter for screening for malignant neoplasm of cervix: Secondary | ICD-10-CM | POA: Diagnosis not present

## 2020-10-10 DIAGNOSIS — S63502A Unspecified sprain of left wrist, initial encounter: Secondary | ICD-10-CM | POA: Diagnosis not present

## 2020-10-10 DIAGNOSIS — R9431 Abnormal electrocardiogram [ECG] [EKG]: Secondary | ICD-10-CM | POA: Diagnosis not present

## 2020-10-10 DIAGNOSIS — E860 Dehydration: Secondary | ICD-10-CM | POA: Diagnosis not present

## 2020-10-15 ENCOUNTER — Encounter (HOSPITAL_COMMUNITY): Payer: Self-pay

## 2020-10-15 ENCOUNTER — Emergency Department (HOSPITAL_COMMUNITY)
Admission: EM | Admit: 2020-10-15 | Discharge: 2020-10-16 | Disposition: A | Discharge status: Home / Self Care | Payer: Medicaid Other | Attending: Emergency Medicine | Admitting: Emergency Medicine

## 2020-10-15 ENCOUNTER — Other Ambulatory Visit: Payer: Self-pay

## 2020-10-15 DIAGNOSIS — Z5321 Procedure and treatment not carried out due to patient leaving prior to being seen by health care provider: Secondary | ICD-10-CM | POA: Diagnosis not present

## 2020-10-15 DIAGNOSIS — R109 Unspecified abdominal pain: Secondary | ICD-10-CM | POA: Diagnosis not present

## 2020-10-15 LAB — CBC
HCT: 35.4 % — ABNORMAL LOW (ref 36.0–46.0)
Hemoglobin: 11.5 g/dL — ABNORMAL LOW (ref 12.0–15.0)
MCH: 31.2 pg (ref 26.0–34.0)
MCHC: 32.5 g/dL (ref 30.0–36.0)
MCV: 95.9 fL (ref 80.0–100.0)
Platelets: 335 10*3/uL (ref 150–400)
RBC: 3.69 MIL/uL — ABNORMAL LOW (ref 3.87–5.11)
RDW: 14.6 % (ref 11.5–15.5)
WBC: 7.8 10*3/uL (ref 4.0–10.5)
nRBC: 0 % (ref 0.0–0.2)

## 2020-10-15 LAB — BASIC METABOLIC PANEL
Anion gap: 9 (ref 5–15)
BUN: 14 mg/dL (ref 6–20)
CO2: 29 mmol/L (ref 22–32)
Calcium: 9.4 mg/dL (ref 8.9–10.3)
Chloride: 107 mmol/L (ref 98–111)
Creatinine, Ser: 1.05 mg/dL — ABNORMAL HIGH (ref 0.44–1.00)
GFR, Estimated: >60 mL/min (ref >60)
Glucose, Bld: 89 mg/dL (ref 70–99)
Potassium: 3.7 mmol/L (ref 3.5–5.1)
Sodium: 145 mmol/L (ref 135–145)

## 2020-10-15 LAB — URINALYSIS, ROUTINE W REFLEX MICROSCOPIC
Bilirubin Urine: NEGATIVE
Glucose, UA: NEGATIVE mg/dL
Hgb urine dipstick: NEGATIVE
Ketones, ur: NEGATIVE mg/dL
Leukocytes,Ua: NEGATIVE
Nitrite: NEGATIVE
Protein, ur: NEGATIVE mg/dL
Specific Gravity, Urine: 1.023 (ref 1.005–1.030)
pH: 6 (ref 5.0–8.0)

## 2020-10-15 LAB — I-STAT BETA HCG BLOOD, ED (MC, WL, AP ONLY): I-stat hCG, quantitative: <5 m[IU]/mL (ref <5)

## 2020-10-15 NOTE — ED Triage Notes (Signed)
Pt reports left flank pain that has radiated to her groin x 2 weeks.

## 2020-10-16 NOTE — ED Notes (Signed)
Patient called from waiting room x1

## 2020-10-16 NOTE — ED Notes (Signed)
Pt called x2, pt currently not present and did not respond to name call, called x2.

## 2020-10-17 ENCOUNTER — Telehealth: Payer: Self-pay

## 2020-10-17 NOTE — Telephone Encounter (Signed)
Transition Care Management Unsuccessful Follow-up Telephone Call  Date of discharge and from where:  10/16/2020-[facility]  Attempts:  1st Attempt  Reason for unsuccessful TCM follow-up call:  Left voice message

## 2020-10-20 NOTE — Telephone Encounter (Signed)
Transition Care Management Follow-up Telephone Call Date of discharge and from where: 10/16/2020 from [facility] How have you been since you were released from the hospital? Pt stated that she is feeling much better and did not have any questions or concerns at this time.  Any questions or concerns? No  Items Reviewed: Did the pt receive and understand the discharge instructions provided? Yes  Medications obtained and verified? Yes  Other? No  Any new allergies since your discharge? No  Dietary orders reviewed? No Do you have support at home? Yes   Functional Questionnaire: (I = Independent and D = Dependent) ADLs: I  Bathing/Dressing- I  Meal Prep- I  Eating- I  Maintaining continence- I  Transferring/Ambulation- I  Managing Meds- I   Follow up appointments reviewed:  PCP Hospital f/u appt confirmed? Yes  Scheduled to see Daisy Lazar, PA-C on 10/12/202 @ 9:00am. [facility] f/u appt confirmed? No   Are transportation arrangements needed? No  If their condition worsens, is the pt aware to call PCP or go to the Emergency Dept.? Yes Was the patient provided with contact information for the PCP's office or ED? Yes Was to pt encouraged to call back with questions or concerns? Yes

## 2021-02-10 DIAGNOSIS — D171 Benign lipomatous neoplasm of skin and subcutaneous tissue of trunk: Secondary | ICD-10-CM | POA: Diagnosis not present

## 2021-02-18 ENCOUNTER — Encounter (HOSPITAL_COMMUNITY): Payer: Self-pay

## 2021-02-18 ENCOUNTER — Emergency Department (HOSPITAL_COMMUNITY)
Admission: EM | Admit: 2021-02-18 | Discharge: 2021-02-18 | Disposition: A | Discharge status: Home / Self Care | Payer: Medicaid Other | Attending: Emergency Medicine | Admitting: Emergency Medicine

## 2021-02-18 DIAGNOSIS — D171 Benign lipomatous neoplasm of skin and subcutaneous tissue of trunk: Secondary | ICD-10-CM | POA: Diagnosis not present

## 2021-02-18 DIAGNOSIS — M546 Pain in thoracic spine: Secondary | ICD-10-CM | POA: Diagnosis present

## 2021-02-18 DIAGNOSIS — D179 Benign lipomatous neoplasm, unspecified: Secondary | ICD-10-CM | POA: Diagnosis not present

## 2021-02-18 MED ORDER — HYDROCODONE-ACETAMINOPHEN 5-325 MG PO TABS
1.0000 | ORAL_TABLET | Freq: Once | ORAL | Status: AC
  Administered 2021-02-18: 1 via ORAL
  Filled 2021-02-18: qty 1

## 2021-02-18 MED ORDER — HYDROCODONE-ACETAMINOPHEN 5-325 MG PO TABS: 2.0000 | ORAL_TABLET | ORAL | 0 refills | Status: DC | PRN

## 2021-02-18 NOTE — ED Provider Notes (Signed)
[facility] DEPT Provider Note   CSN: [id number] Arrival date & time: 02/18/21  1021     History  Chief Complaint  Patient presents with   Cyst    Jennifer Booth is a 36 y.o. female.  HPI She is here for evaluation of a nodule on her left upper back which is getting larger over the last 3 weeks.  It has been present for several months.  Initially it was smaller.  She saw PCP, 2 weeks ago and was referred to plastic surgery, appointment was scheduled for next Wednesday.  She is here now concerned that it is getting bigger and causing pain.  She is not getting relief with over-the-counter medications.  The pain radiates to her left anterior chest.  She denies fever, chills, nausea or dizziness.  There is no shortness of breath, or leg pain.    Home Medications Prior to Admission medications   Medication Sig Start Date End Date Taking? Authorizing Provider  HYDROcodone-acetaminophen (NORCO) 5-325 MG tablet Take 2 tablets by mouth every 4 (four) hours as needed for moderate pain or severe pain. 02/18/21  Yes Daleen Bo, MD  acetaminophen (TYLENOL) 325 MG tablet Take 2 tablets (650 mg total) by mouth every 6 (six) hours as needed for mild pain (or Fever >/= 101). Patient not taking: Reported on 09/24/2020 08/14/20   Norm Parcel, PA-C  amLODipine (NORVASC) 10 MG tablet Take 10 mg by mouth daily. 07/17/20   [provider]  hydrochlorothiazide (HYDRODIURIL) 12.5 MG tablet Take 12.5 mg by mouth every morning. 07/15/20   [provider]  ibuprofen (ADVIL) 800 MG tablet Take 1 tablet (800 mg total) by mouth every 8 (eight) hours as needed. 08/22/20   Shelly Bombard, MD  metroNIDAZOLE (FLAGYL) 500 MG tablet Take 1 tablet (500 mg total) by mouth 2 (two) times daily. 08/28/20   Shelly Bombard, MD  Prenat-Fe Poly-Methfol-FA-DHA (VITAFOL ULTRA) 29-0.6-0.4-200 MG CAPS Take 1 capsule by mouth daily before breakfast. 09/24/20   Shelly Bombard, MD   promethazine (PHENERGAN) 25 MG tablet Take 1 tablet (25 mg total) by mouth every 6 (six) hours as needed for nausea or vomiting. Patient not taking: Reported on 09/24/2020 08/11/20   Barrie Folk, PA-C      Allergies    Ciprofloxacin and Zofran [ondansetron hcl]    Review of Systems   Review of Systems  All other systems reviewed and are negative.  Physical Exam Updated Vital Signs BP (!) 160/104 (BP Location: Left Arm)    Pulse 80    Temp 98.3 F (36.8 C) (Oral)    Resp 16    Ht 5\' 3"  (1.6 m)    Wt 131.1 kg    SpO2 100%    BMI 51.19 kg/m  Physical Exam Vitals and nursing note reviewed.  Constitutional:      General: She is not in acute distress.    Appearance: She is well-developed. She is obese. She is not ill-appearing or diaphoretic.  HENT:     Head: Normocephalic and atraumatic.     Right Ear: External ear normal.     Left Ear: External ear normal.  Eyes:     Conjunctiva/sclera: Conjunctivae normal.     Pupils: Pupils are equal, round, and reactive to light.  Neck:     Trachea: Phonation normal.  Cardiovascular:     Rate and Rhythm: Normal rate.  Pulmonary:     Effort: Pulmonary effort is normal.  Musculoskeletal:  General: Normal range of motion.     Cervical back: Normal range of motion and neck supple.  Skin:    General: Skin is warm and dry.     Comments: There is a mobile subcutaneous nodule, near the upper aspect of the left scapula.  This is somewhat irregular in shape, and has a consistency of a lipoma.  There is no overlying skin change, redness, or indicator for a pointing abscess.  Neurological:     Mental Status: She is alert and oriented to person, place, and time.     Cranial Nerves: No cranial nerve deficit.     Sensory: No sensory deficit.     Motor: No abnormal muscle tone.     Coordination: Coordination normal.  Psychiatric:        Mood and Affect: Mood normal.        Behavior: Behavior normal.        Thought Content: Thought  content normal.        Judgment: Judgment normal.    ED Results / Procedures / Treatments   Labs (all labs ordered are listed, but only abnormal results are displayed) Labs Reviewed - No data to display  EKG None  Radiology No results found.  Procedures Procedures    Medications Ordered in ED Medications  HYDROcodone-acetaminophen (NORCO/VICODIN) 5-325 MG per tablet 1 tablet (1 tablet Oral Given 02/18/21 1155)    ED Course/ Medical Decision Making/ A&P                           Medical Decision Making She is presenting for evaluation of nodule which is gradually worse  Amount and/or Complexity of Data Reviewed Discussion of management or test interpretation with external provider(s): This nodule is most likely a lipoma of her left upper back.  There is no indicator for requirement of intervention in the ED including treatment with antibiotics, or I&D.  She has a short-term follow-up scheduled with plastic surgery which is adequate for management of the growing nodule.  In the meantime I will treat her with Norco, to help with her discomfort.  She is counseled to avoid driving or working/going to school when she is taking the narcotic pain reliever.           Final Clinical Impression(s) / ED Diagnoses Final diagnoses:  Lipoma of torso    Rx / DC Orders ED Discharge Orders          Ordered    HYDROcodone-acetaminophen (NORCO) 5-325 MG tablet  Every 4 hours PRN        02/18/21 1205              Daleen Bo, MD 02/18/21 1205

## 2021-02-18 NOTE — ED Triage Notes (Signed)
Pt c/o cyst on L upper back since last July with noticeable swelling and pain in the last two weeks.

## 2021-02-18 NOTE — Discharge Instructions (Addendum)
The nodule on your left upper back is most likely a lipoma.  This can be managed by the plastic surgeon, starting with your regularly scheduled appointment next week.  We are going to prescribe a narcotic pain reliever.  Do not drive, work or go to school when you are taking it.  You can try using heat on the sore area to help with the discomfort.

## 2021-02-18 NOTE — ED Notes (Signed)
Pt verbalized understanding of dc instructions and scripts no questions or concerns at this time will follow up with pcp. Pt ambulated to lobby for ride to transport home.

## 2021-02-19 NOTE — Telephone Encounter (Signed)
error 

## 2021-02-25 ENCOUNTER — Ambulatory Visit: Payer: Medicaid Other | Admitting: Plastic Surgery

## 2021-02-25 ENCOUNTER — Other Ambulatory Visit: Payer: Self-pay

## 2021-02-25 ENCOUNTER — Encounter: Payer: Self-pay | Admitting: Plastic Surgery

## 2021-02-25 VITALS — BP 161/113 | HR 73 | Ht 63.0 in | Wt 288.6 lb

## 2021-02-25 DIAGNOSIS — D171 Benign lipomatous neoplasm of skin and subcutaneous tissue of trunk: Secondary | ICD-10-CM

## 2021-02-25 DIAGNOSIS — L989 Disorder of the skin and subcutaneous tissue, unspecified: Secondary | ICD-10-CM | POA: Diagnosis not present

## 2021-02-25 NOTE — Progress Notes (Signed)
Referring Provider Loyola Mast, PA-C [address] [place],  [place] [id number]   CC:  Chief Complaint  Patient presents with   Consult      Jennifer Booth is an 36 y.o. female.  HPI: Patient presents to discuss a left upper back mass.  Is been present for a number of years but seems to be getting larger recently.  She believes is contributing to back and shoulder pain and she would like to have it removed.  She is also noted a new lesion in her right buttock that is growing in size as well.  She wants to see if this could be removed at the same time.  Allergies  Allergen Reactions   Ciprofloxacin Anaphylaxis   Zofran [Ondansetron Hcl] Anaphylaxis    Outpatient Encounter Medications as of 02/25/2021  Medication Sig   amLODipine (NORVASC) 10 MG tablet Take 10 mg by mouth daily.   hydrochlorothiazide (HYDRODIURIL) 12.5 MG tablet Take 12.5 mg by mouth every morning.   [DISCONTINUED] acetaminophen (TYLENOL) 325 MG tablet Take 2 tablets (650 mg total) by mouth every 6 (six) hours as needed for mild pain (or Fever >/= 101). (Patient not taking: Reported on 09/24/2020)   [DISCONTINUED] HYDROcodone-acetaminophen (NORCO) 5-325 MG tablet Take 2 tablets by mouth every 4 (four) hours as needed for moderate pain or severe pain.   [DISCONTINUED] ibuprofen (ADVIL) 800 MG tablet Take 1 tablet (800 mg total) by mouth every 8 (eight) hours as needed.   [DISCONTINUED] metroNIDAZOLE (FLAGYL) 500 MG tablet Take 1 tablet (500 mg total) by mouth 2 (two) times daily.   [DISCONTINUED] Prenat-Fe Poly-Methfol-FA-DHA (VITAFOL ULTRA) 29-0.6-0.4-200 MG CAPS Take 1 capsule by mouth daily before breakfast.   [DISCONTINUED] promethazine (PHENERGAN) 25 MG tablet Take 1 tablet (25 mg total) by mouth every 6 (six) hours as needed for nausea or vomiting. (Patient not taking: Reported on 09/24/2020)   No facility-administered encounter medications on file as of 02/25/2021.     Past Medical  History:  Diagnosis Date   Anemia    Pre-eclampsia 2018    Past Surgical History:  Procedure Laterality Date   CESAREAN SECTION     CHOLECYSTECTOMY N/A 08/13/2020   Procedure: LAPAROSCOPIC CHOLECYSTECTOMY WITH INTRAOPERATIVE CHOLANGIOGRAM;  Surgeon: Armandina Gemma, MD;  Location: [facility];  Service: General;  Laterality: N/A;   LAPAROSCOPIC TUBAL LIGATION Bilateral 08/02/2018   Procedure: LAPAROSCOPIC TUBAL LIGATION WITH FILSHIE CLIPS;  Surgeon: Sloan Leiter, MD;  Location: [facility];  Service: Gynecology;  Laterality: Bilateral;    Family History  Problem Relation Age of Onset   Hypertension Mother    Diabetes Mother    Hypercholesterolemia Father    Hypertension Father    Heart disease Maternal Grandmother    Diabetes Maternal Grandmother     Social History   Social History Narrative   Not on file     Review of Systems General: Denies fevers, chills, weight loss CV: Denies chest pain, shortness of breath, palpitations  Physical Exam Vitals with BMI 02/25/2021 02/18/2021 02/18/2021  Height 5\' 3"  - -  Weight 288 lbs 10 oz - -  BMI 59.74 - -  Systolic 163 845 364  Diastolic 680 321 93  Pulse 73 80 -    General:  No acute distress,  Alert and oriented, Non-Toxic, Normal speech and affect Examination shows what feels like a 6 to 8 cm mass in the left upper back just to the left of midline.  It is freely  mobile and there is no overlying skin changes.  In the right inferior lateral buttock there is a 2 to 3 cm pedunculated mass that is growing out from the skin.  No surrounding induration or other signs of inflammation.  Assessment/Plan Patient presents with what I suspect is a left upper back lipoma and a right inferior buttock skin lesion.  They are both growing in size and are causing her symptoms.  She is interested in having the both removed.  We reviewed the risks that include bleeding, infection, damage to surrounding structures and need for additional  procedures.  I discussed the location and orientation of the scar.  All of her questions were answered and we will plan to move forward with setting this up in the operating room.  [name] 02/25/2021, 5:11 PM

## 2021-03-02 NOTE — Progress Notes (Signed)
Patient ID: Jennifer Booth, female    DOB: 17-Dec-1985, 36 y.o.   MRN: [id number]  Chief Complaint  Patient presents with   Pre-op Exam      ICD-10-CM   1. Lipoma of back  D17.1        History of Present Illness: Jennifer Booth is a 36 y.o.  female  with a history of mass on left upper back and right inferior buttock suspected to be lipomas.  She presents for preoperative evaluation for upcoming procedure, mass excision from left upper back in right buttock, scheduled for 03/10/2021 with Dr. Claudia Desanctis.  The patient has not had problems with anesthesia.  Patient reports that she has had previous cholecystectomy and tubal ligation without complication or difficulty from anesthesia.  She does endorse allergy to Zofran, but states that she can take Phenergan.  She denies any personal or family history of blood clots or clotting disorder.  She does not take any blood thinners.  No personal history of cancer.  Denies any leg swelling or varicosities.  Denies birth control/hormone replacement.  Denies recent illness or infection.    I reviewed her medical record and she was seen in the ER 02/18/2021 for complaints of pain associated with her left upper back mass.  There were no findings concerning for abscess or infection.  Suspected to be lipoma.  She was sent home with Norco given her reports of considerable discomfort.  Summary of Previous Visit: Patient was seen for consult 02/25/2021.  At that time, she complained of a mass on her left upper back that has been present for a number of years, but seemingly getting larger.  She thinks that this contributed to her back and shoulder discomfort.  She also has noticed a new lesion involving her right buttock that is also growing in size.  She expressed interest in surgical excision for both of them at the same time.  The mass on left upper back is approximately 6 to 8 cm, buttock is approximately 2 to 3 cm.  Discussed surgical excision, patient expressed  desire to proceed.  Job: Chief Executive Officer.  PMH Significant for: Suspected lipomas, HTN, BMI > 50.   Past Medical History: Allergies: Allergies  Allergen Reactions   Ciprofloxacin Anaphylaxis   Zofran [Ondansetron Hcl] Anaphylaxis    Current Medications:  Current Outpatient Medications:    amLODipine (NORVASC) 10 MG tablet, Take 10 mg by mouth daily., Disp: , Rfl:    hydrochlorothiazide (HYDRODIURIL) 12.5 MG tablet, Take 12.5 mg by mouth every morning., Disp: , Rfl:    HYDROcodone-acetaminophen (NORCO) 5-325 MG tablet, Take 1 tablet by mouth every 6 (six) hours as needed for up to 5 days for severe pain., Disp: 15 tablet, Rfl: 0   promethazine (PHENERGAN) 12.5 MG tablet, Take 1 tablet (12.5 mg total) by mouth every 8 (eight) hours as needed for nausea or vomiting., Disp: 20 tablet, Rfl: 0  Past Medical Problems: Past Medical History:  Diagnosis Date   Anemia    Pre-eclampsia 2018    Past Surgical History: Past Surgical History:  Procedure Laterality Date   CESAREAN SECTION     CHOLECYSTECTOMY N/A 08/13/2020   Procedure: LAPAROSCOPIC CHOLECYSTECTOMY WITH INTRAOPERATIVE CHOLANGIOGRAM;  Surgeon: Armandina Gemma, MD;  Location: [facility];  Service: General;  Laterality: N/A;   LAPAROSCOPIC TUBAL LIGATION Bilateral 08/02/2018   Procedure: LAPAROSCOPIC TUBAL LIGATION WITH FILSHIE CLIPS;  Surgeon: Sloan Leiter, MD;  Location: [facility];  Service: Gynecology;  Laterality: Bilateral;  Social History: Social History   Socioeconomic History   Marital status: Single    Spouse name: Not on file   Number of children: Not on file   Years of education: Not on file   Highest education level: Not on file  Occupational History   Not on file  Tobacco Use   Smoking status: Never   Smokeless tobacco: Never  Vaping Use   Vaping Use: Never used  Substance and Sexual Activity   Alcohol use: Not Currently   Drug use: Never   Sexual activity: Yes    Partners: Male    Birth  control/protection: Surgical  Other Topics Concern   Not on file  Social History Narrative   Not on file   Social Determinants of Health   Financial Resource Strain: Not on file  Food Insecurity: Not on file  Transportation Needs: Not on file  Physical Activity: Not on file  Stress: Not on file  Social Connections: Not on file  Intimate Partner Violence: Not on file    Family History: Family History  Problem Relation Age of Onset   Hypertension Mother    Diabetes Mother    Hypercholesterolemia Father    Hypertension Father    Heart disease Maternal Grandmother    Diabetes Maternal Grandmother     Review of Systems: ROS Denies recent chest pain, shortness of breath, fevers or chills, or infection.  Physical Exam: Vital Signs BP (!) 152/98 (BP Location: Left Arm, Patient Position: Sitting, Cuff Size: Large)    Pulse 95    Ht 5\' 3"  (1.6 m)    Wt 292 lb 3.2 oz (132.5 kg)    LMP 02/02/2021 (Exact Date)    SpO2 100%    BMI 51.76 kg/m   Physical Exam Constitutional:      General: Not in acute distress.    Appearance: Normal appearance. Not ill-appearing.  HENT:     Head: Normocephalic and atraumatic.  Eyes:     Pupils: Pupils are equal, round. Cardiovascular:     Rate and Rhythm: Normal rate.    Pulses: Normal pulses.  Pulmonary:     Effort: No respiratory distress or increased work of breathing.  Speaks in full sentences. Abdominal:     General: Abdomen is flat. No distension.   Musculoskeletal: Normal range of motion. No lower extremity swelling or edema. No varicosities.  Skin:    General: Skin is warm and dry.     Findings: No erythema or rash.  Neurological:     Mental Status: Alert and oriented to person, place, and time.  Psychiatric:        Mood and Affect: Mood normal.        Behavior: Behavior normal.    Assessment/Plan: The patient is scheduled for excision of masses on left back and right buttock 03/10/2021 with Dr. Claudia Desanctis.  Risks, benefits, and  alternatives of procedure discussed, questions answered and consent obtained.    Smoking Status: Non-smoker.  Caprini Score: 4; Risk Factors include: BMI greater than 50 and length of planned surgery. Recommendation for mechanical prophylaxis. Encourage early ambulation.   Pictures obtained: None  Post-op Rx sent to pharmacy: Norco, Phenergan.  Patient was provided with the General Surgical Risk consent document and Pain Medication Agreement prior to their appointment.  They had adequate time to read through the risk consent documents and Pain Medication Agreement. We also discussed them in person together during this preop appointment. All of their questions were answered to their satisfaction.  Recommended calling if they have any further questions.  Risk consent form and Pain Medication Agreement to be scanned into patient's chart.    Electronically signed by: Krista Blue, PA-C 03/04/2021 9:37 AM

## 2021-03-02 NOTE — H&P (View-Only) (Signed)
Patient ID: Jennifer Booth, female    DOB: 03/22/1985, 36 y.o.   MRN: [id number]  Chief Complaint  Patient presents with   Pre-op Exam      ICD-10-CM   1. Lipoma of back  D17.1        History of Present Illness: Jennifer Booth is a 36 y.o.  female  with a history of mass on left upper back and right inferior buttock suspected to be lipomas.  She presents for preoperative evaluation for upcoming procedure, mass excision from left upper back in right buttock, scheduled for 03/10/2021 with Dr. Claudia Desanctis.  The patient has not had problems with anesthesia.  Patient reports that she has had previous cholecystectomy and tubal ligation without complication or difficulty from anesthesia.  She does endorse allergy to Zofran, but states that she can take Phenergan.  She denies any personal or family history of blood clots or clotting disorder.  She does not take any blood thinners.  No personal history of cancer.  Denies any leg swelling or varicosities.  Denies birth control/hormone replacement.  Denies recent illness or infection.    I reviewed her medical record and she was seen in the ER 02/18/2021 for complaints of pain associated with her left upper back mass.  There were no findings concerning for abscess or infection.  Suspected to be lipoma.  She was sent home with Norco given her reports of considerable discomfort.  Summary of Previous Visit: Patient was seen for consult 02/25/2021.  At that time, she complained of a mass on her left upper back that has been present for a number of years, but seemingly getting larger.  She thinks that this contributed to her back and shoulder discomfort.  She also has noticed a new lesion involving her right buttock that is also growing in size.  She expressed interest in surgical excision for both of them at the same time.  The mass on left upper back is approximately 6 to 8 cm, buttock is approximately 2 to 3 cm.  Discussed surgical excision, patient expressed  desire to proceed.  Job: Chief Executive Officer.  PMH Significant for: Suspected lipomas, HTN, BMI > 50.   Past Medical History: Allergies: Allergies  Allergen Reactions   Ciprofloxacin Anaphylaxis   Zofran [Ondansetron Hcl] Anaphylaxis    Current Medications:  Current Outpatient Medications:    amLODipine (NORVASC) 10 MG tablet, Take 10 mg by mouth daily., Disp: , Rfl:    hydrochlorothiazide (HYDRODIURIL) 12.5 MG tablet, Take 12.5 mg by mouth every morning., Disp: , Rfl:    HYDROcodone-acetaminophen (NORCO) 5-325 MG tablet, Take 1 tablet by mouth every 6 (six) hours as needed for up to 5 days for severe pain., Disp: 15 tablet, Rfl: 0   promethazine (PHENERGAN) 12.5 MG tablet, Take 1 tablet (12.5 mg total) by mouth every 8 (eight) hours as needed for nausea or vomiting., Disp: 20 tablet, Rfl: 0  Past Medical Problems: Past Medical History:  Diagnosis Date   Anemia    Pre-eclampsia 2018    Past Surgical History: Past Surgical History:  Procedure Laterality Date   CESAREAN SECTION     CHOLECYSTECTOMY N/A 08/13/2020   Procedure: LAPAROSCOPIC CHOLECYSTECTOMY WITH INTRAOPERATIVE CHOLANGIOGRAM;  Surgeon: Armandina Gemma, MD;  Location: [facility];  Service: General;  Laterality: N/A;   LAPAROSCOPIC TUBAL LIGATION Bilateral 08/02/2018   Procedure: LAPAROSCOPIC TUBAL LIGATION WITH FILSHIE CLIPS;  Surgeon: Sloan Leiter, MD;  Location: [facility];  Service: Gynecology;  Laterality: Bilateral;  Social History: Social History   Socioeconomic History   Marital status: Single    Spouse name: Not on file   Number of children: Not on file   Years of education: Not on file   Highest education level: Not on file  Occupational History   Not on file  Tobacco Use   Smoking status: Never   Smokeless tobacco: Never  Vaping Use   Vaping Use: Never used  Substance and Sexual Activity   Alcohol use: Not Currently   Drug use: Never   Sexual activity: Yes    Partners: Male    Birth  control/protection: Surgical  Other Topics Concern   Not on file  Social History Narrative   Not on file   Social Determinants of Health   Financial Resource Strain: Not on file  Food Insecurity: Not on file  Transportation Needs: Not on file  Physical Activity: Not on file  Stress: Not on file  Social Connections: Not on file  Intimate Partner Violence: Not on file    Family History: Family History  Problem Relation Age of Onset   Hypertension Mother    Diabetes Mother    Hypercholesterolemia Father    Hypertension Father    Heart disease Maternal Grandmother    Diabetes Maternal Grandmother     Review of Systems: ROS Denies recent chest pain, shortness of breath, fevers or chills, or infection.  Physical Exam: Vital Signs BP (!) 152/98 (BP Location: Left Arm, Patient Position: Sitting, Cuff Size: Large)    Pulse 95    Ht 5\' 3"  (1.6 m)    Wt 292 lb 3.2 oz (132.5 kg)    LMP 02/02/2021 (Exact Date)    SpO2 100%    BMI 51.76 kg/m   Physical Exam Constitutional:      General: Not in acute distress.    Appearance: Normal appearance. Not ill-appearing.  HENT:     Head: Normocephalic and atraumatic.  Eyes:     Pupils: Pupils are equal, round. Cardiovascular:     Rate and Rhythm: Normal rate.    Pulses: Normal pulses.  Pulmonary:     Effort: No respiratory distress or increased work of breathing.  Speaks in full sentences. Abdominal:     General: Abdomen is flat. No distension.   Musculoskeletal: Normal range of motion. No lower extremity swelling or edema. No varicosities.  Skin:    General: Skin is warm and dry.     Findings: No erythema or rash.  Neurological:     Mental Status: Alert and oriented to person, place, and time.  Psychiatric:        Mood and Affect: Mood normal.        Behavior: Behavior normal.    Assessment/Plan: The patient is scheduled for excision of masses on left back and right buttock 03/10/2021 with Dr. Claudia Desanctis.  Risks, benefits, and  alternatives of procedure discussed, questions answered and consent obtained.    Smoking Status: Non-smoker.  Caprini Score: 4; Risk Factors include: BMI greater than 50 and length of planned surgery. Recommendation for mechanical prophylaxis. Encourage early ambulation.   Pictures obtained: None  Post-op Rx sent to pharmacy: Norco, Phenergan.  Patient was provided with the General Surgical Risk consent document and Pain Medication Agreement prior to their appointment.  They had adequate time to read through the risk consent documents and Pain Medication Agreement. We also discussed them in person together during this preop appointment. All of their questions were answered to their satisfaction.  Recommended calling if they have any further questions.  Risk consent form and Pain Medication Agreement to be scanned into patient's chart.    Electronically signed by: Krista Blue, PA-C 03/04/2021 9:37 AM

## 2021-03-04 ENCOUNTER — Ambulatory Visit (INDEPENDENT_AMBULATORY_CARE_PROVIDER_SITE_OTHER): Payer: Medicaid Other | Admitting: Physician Assistant

## 2021-03-04 ENCOUNTER — Other Ambulatory Visit: Payer: Self-pay

## 2021-03-04 VITALS — BP 152/98 | HR 95 | Ht 63.0 in | Wt 292.2 lb

## 2021-03-04 DIAGNOSIS — D171 Benign lipomatous neoplasm of skin and subcutaneous tissue of trunk: Secondary | ICD-10-CM

## 2021-03-04 MED ORDER — HYDROCODONE-ACETAMINOPHEN 5-325 MG PO TABS: 1.0000 | ORAL_TABLET | Freq: Four times a day (QID) | ORAL | 0 refills | Status: AC | PRN

## 2021-03-04 MED ORDER — PROMETHAZINE HCL 12.5 MG PO TABS: 12.5000 mg | ORAL_TABLET | Freq: Three times a day (TID) | ORAL | 0 refills | Status: DC | PRN

## 2021-03-09 ENCOUNTER — Other Ambulatory Visit: Payer: Self-pay

## 2021-03-09 ENCOUNTER — Encounter (HOSPITAL_COMMUNITY): Payer: Self-pay | Admitting: Plastic Surgery

## 2021-03-09 NOTE — Progress Notes (Addendum)
Jennifer Booth denies chest pain or shortness of breath.  Patient denies having any s/s of Covid in her household or any known exposure to Covid.  PCP is Naill Administrator, arts  at [facility].   Jennifer Booth has type II diabetes, patient reports that last A1C was 5.7. Patient does not have a CBG machine.  I instructed Jennifer Booth to shower with antibiotic soap, if it is available.  Dry off with a clean towel. Do not put lotion, powder, cologne or deodorant or makeup.No jewelry or piercings. Men may shave their face and neck. Woman should not shave. No nail polish, artificial or acrylic nails. Wear clean clothes, brush your teeth. Glasses, contact lens,dentures or partials may not be worn in the OR. If you need to wear them, please bring a case for glasses, do not wear contacts or bring a case, the hospital does not have contact cases, dentures or partials will have to be removed , make sure they are clean, we will provide a denture cup to put them in. You will need some one to drive you home and a responsible person over the age of 69 to stay with you for the first 24 hours after surgery.

## 2021-03-10 ENCOUNTER — Other Ambulatory Visit: Payer: Self-pay

## 2021-03-10 ENCOUNTER — Ambulatory Visit (HOSPITAL_COMMUNITY)
Admission: RE | Admit: 2021-03-10 | Discharge: 2021-03-10 | Disposition: A | Discharge status: Home / Self Care | Payer: Medicaid Other | Attending: Plastic Surgery | Admitting: Plastic Surgery

## 2021-03-10 ENCOUNTER — Telehealth: Payer: Self-pay

## 2021-03-10 ENCOUNTER — Encounter (HOSPITAL_COMMUNITY): Payer: Self-pay | Admitting: Plastic Surgery

## 2021-03-10 ENCOUNTER — Ambulatory Visit (HOSPITAL_COMMUNITY): Payer: Medicaid Other | Admitting: Certified Registered"

## 2021-03-10 ENCOUNTER — Encounter (HOSPITAL_COMMUNITY)
Admission: RE | Disposition: A | Discharge status: Home / Self Care | Payer: Self-pay | Source: Home / Self Care | Attending: Plastic Surgery

## 2021-03-10 DIAGNOSIS — D171 Benign lipomatous neoplasm of skin and subcutaneous tissue of trunk: Secondary | ICD-10-CM | POA: Diagnosis not present

## 2021-03-10 DIAGNOSIS — D1739 Benign lipomatous neoplasm of skin and subcutaneous tissue of other sites: Secondary | ICD-10-CM | POA: Diagnosis not present

## 2021-03-10 DIAGNOSIS — D649 Anemia, unspecified: Secondary | ICD-10-CM | POA: Insufficient documentation

## 2021-03-10 DIAGNOSIS — I10 Essential (primary) hypertension: Secondary | ICD-10-CM | POA: Insufficient documentation

## 2021-03-10 DIAGNOSIS — Z6841 Body Mass Index (BMI) 40.0 and over, adult: Secondary | ICD-10-CM | POA: Insufficient documentation

## 2021-03-10 DIAGNOSIS — D630 Anemia in neoplastic disease: Secondary | ICD-10-CM | POA: Diagnosis not present

## 2021-03-10 HISTORY — PX: MASS EXCISION: SHX2000

## 2021-03-10 HISTORY — DX: Prediabetes: R73.03

## 2021-03-10 HISTORY — PX: NEVUS EXCISION: SHX5263

## 2021-03-10 LAB — BASIC METABOLIC PANEL
Anion gap: 7 (ref 5–15)
BUN: 8 mg/dL (ref 6–20)
CO2: 24 mmol/L (ref 22–32)
Calcium: 8.4 mg/dL — ABNORMAL LOW (ref 8.9–10.3)
Chloride: 109 mmol/L (ref 98–111)
Creatinine, Ser: 1.15 mg/dL — ABNORMAL HIGH (ref 0.44–1.00)
GFR, Estimated: >60 mL/min (ref >60)
Glucose, Bld: 101 mg/dL — ABNORMAL HIGH (ref 70–99)
Potassium: 3.3 mmol/L — ABNORMAL LOW (ref 3.5–5.1)
Sodium: 140 mmol/L (ref 135–145)

## 2021-03-10 LAB — CBC
HCT: 34.1 % — ABNORMAL LOW (ref 36.0–46.0)
Hemoglobin: 11.5 g/dL — ABNORMAL LOW (ref 12.0–15.0)
MCH: 31.9 pg (ref 26.0–34.0)
MCHC: 33.7 g/dL (ref 30.0–36.0)
MCV: 94.5 fL (ref 80.0–100.0)
Platelets: 305 10*3/uL (ref 150–400)
RBC: 3.61 MIL/uL — ABNORMAL LOW (ref 3.87–5.11)
RDW: 14.6 % (ref 11.5–15.5)
WBC: 5.2 10*3/uL (ref 4.0–10.5)
nRBC: 0 % (ref 0.0–0.2)

## 2021-03-10 LAB — POCT PREGNANCY, URINE: Preg Test, Ur: NEGATIVE

## 2021-03-10 SURGERY — EXCISION MASS
Anesthesia: General | Site: Back | Laterality: Right

## 2021-03-10 MED ORDER — ACETAMINOPHEN 10 MG/ML IV SOLN
INTRAVENOUS | Status: AC
  Filled 2021-03-10: qty 100

## 2021-03-10 MED ORDER — CHLORHEXIDINE GLUCONATE CLOTH 2 % EX PADS: 6.0000 | MEDICATED_PAD | Freq: Once | CUTANEOUS | Status: DC

## 2021-03-10 MED ORDER — LIDOCAINE 2% (20 MG/ML) 5 ML SYRINGE
INTRAMUSCULAR | Status: DC | PRN
Start: 2021-03-10 — End: 2021-03-10
  Administered 2021-03-10: 80 mg via INTRAVENOUS

## 2021-03-10 MED ORDER — CHLORHEXIDINE GLUCONATE 0.12 % MT SOLN
OROMUCOSAL | Status: AC
  Administered 2021-03-10: 15 mL via OROMUCOSAL
  Filled 2021-03-10: qty 15

## 2021-03-10 MED ORDER — DEXAMETHASONE SODIUM PHOSPHATE 10 MG/ML IJ SOLN
INTRAMUSCULAR | Status: DC | PRN
Start: 2021-03-10 — End: 2021-03-10
  Administered 2021-03-10: 4 mg via INTRAVENOUS

## 2021-03-10 MED ORDER — SUGAMMADEX SODIUM 200 MG/2ML IV SOLN
INTRAVENOUS | Status: DC | PRN
  Administered 2021-03-10 (×2): 200 mg via INTRAVENOUS

## 2021-03-10 MED ORDER — LIDOCAINE 2% (20 MG/ML) 5 ML SYRINGE
INTRAMUSCULAR | Status: AC
  Filled 2021-03-10: qty 5

## 2021-03-10 MED ORDER — FENTANYL CITRATE (PF) 250 MCG/5ML IJ SOLN
INTRAMUSCULAR | Status: AC
  Filled 2021-03-10: qty 5

## 2021-03-10 MED ORDER — CEFAZOLIN IN SODIUM CHLORIDE 3-0.9 GM/100ML-% IV SOLN
3.0000 g | INTRAVENOUS | Status: AC
  Administered 2021-03-10: 3 g via INTRAVENOUS
  Filled 2021-03-10: qty 100

## 2021-03-10 MED ORDER — DIPHENHYDRAMINE HCL 50 MG/ML IJ SOLN
INTRAMUSCULAR | Status: AC
  Filled 2021-03-10: qty 1

## 2021-03-10 MED ORDER — FENTANYL CITRATE (PF) 100 MCG/2ML IJ SOLN
INTRAMUSCULAR | Status: AC
  Filled 2021-03-10: qty 2

## 2021-03-10 MED ORDER — FENTANYL CITRATE (PF) 250 MCG/5ML IJ SOLN
INTRAMUSCULAR | Status: DC | PRN
  Administered 2021-03-10 (×2): 50 ug via INTRAVENOUS
  Administered 2021-03-10: 100 ug via INTRAVENOUS
  Administered 2021-03-10: 50 ug via INTRAVENOUS

## 2021-03-10 MED ORDER — MIDAZOLAM HCL 2 MG/2ML IJ SOLN
INTRAMUSCULAR | Status: AC
  Filled 2021-03-10: qty 2

## 2021-03-10 MED ORDER — SCOPOLAMINE 1 MG/3DAYS TD PT72
MEDICATED_PATCH | TRANSDERMAL | Status: AC
  Filled 2021-03-10: qty 1

## 2021-03-10 MED ORDER — EPHEDRINE 5 MG/ML INJ
INTRAVENOUS | Status: AC
  Filled 2021-03-10: qty 5

## 2021-03-10 MED ORDER — ROCURONIUM BROMIDE 10 MG/ML (PF) SYRINGE
PREFILLED_SYRINGE | INTRAVENOUS | Status: AC
  Filled 2021-03-10: qty 10

## 2021-03-10 MED ORDER — HYDROCODONE-ACETAMINOPHEN 5-325 MG PO TABS: 1.0000 | ORAL_TABLET | Freq: Four times a day (QID) | ORAL | 0 refills | Status: AC | PRN

## 2021-03-10 MED ORDER — LACTATED RINGERS IV SOLN: INTRAVENOUS | Status: DC

## 2021-03-10 MED ORDER — DIPHENHYDRAMINE HCL 50 MG/ML IJ SOLN
INTRAMUSCULAR | Status: DC | PRN
Start: 2021-03-10 — End: 2021-03-10
  Administered 2021-03-10: 12.5 mg via INTRAVENOUS

## 2021-03-10 MED ORDER — LIDOCAINE-EPINEPHRINE (PF) 1 %-1:200000 IJ SOLN
INTRAMUSCULAR | Status: DC | PRN
  Administered 2021-03-10: 6 mL

## 2021-03-10 MED ORDER — MIDAZOLAM HCL 2 MG/2ML IJ SOLN
INTRAMUSCULAR | Status: DC | PRN
  Administered 2021-03-10: 2 mg via INTRAVENOUS

## 2021-03-10 MED ORDER — SCOPOLAMINE 1 MG/3DAYS TD PT72
MEDICATED_PATCH | TRANSDERMAL | Status: DC | PRN
  Administered 2021-03-10: 1.5 mg via TRANSDERMAL

## 2021-03-10 MED ORDER — BUPIVACAINE-EPINEPHRINE (PF) 0.25% -1:200000 IJ SOLN
INTRAMUSCULAR | Status: AC
  Filled 2021-03-10: qty 30

## 2021-03-10 MED ORDER — PHENYLEPHRINE 40 MCG/ML (10ML) SYRINGE FOR IV PUSH (FOR BLOOD PRESSURE SUPPORT)
PREFILLED_SYRINGE | INTRAVENOUS | Status: AC
  Filled 2021-03-10: qty 10

## 2021-03-10 MED ORDER — ORAL CARE MOUTH RINSE: 15.0000 mL | Freq: Once | OROMUCOSAL | Status: AC

## 2021-03-10 MED ORDER — CEFAZOLIN SODIUM-DEXTROSE 2-4 GM/100ML-% IV SOLN
INTRAVENOUS | Status: AC
  Filled 2021-03-10: qty 100

## 2021-03-10 MED ORDER — PROPOFOL 10 MG/ML IV BOLUS
INTRAVENOUS | Status: DC | PRN
Start: 2021-03-10 — End: 2021-03-10
  Administered 2021-03-10: 180 mg via INTRAVENOUS

## 2021-03-10 MED ORDER — ACETAMINOPHEN 10 MG/ML IV SOLN
1000.0000 mg | Freq: Once | INTRAVENOUS | Status: DC | PRN
  Administered 2021-03-10: 1000 mg via INTRAVENOUS

## 2021-03-10 MED ORDER — CHLORHEXIDINE GLUCONATE 0.12 % MT SOLN: 15.0000 mL | Freq: Once | OROMUCOSAL | Status: AC

## 2021-03-10 MED ORDER — BUPIVACAINE-EPINEPHRINE (PF) 0.25% -1:200000 IJ SOLN
INTRAMUSCULAR | Status: DC | PRN
  Administered 2021-03-10: 30 mL via PERINEURAL

## 2021-03-10 MED ORDER — DEXAMETHASONE SODIUM PHOSPHATE 10 MG/ML IJ SOLN
INTRAMUSCULAR | Status: AC
  Filled 2021-03-10: qty 1

## 2021-03-10 MED ORDER — PHENYLEPHRINE 40 MCG/ML (10ML) SYRINGE FOR IV PUSH (FOR BLOOD PRESSURE SUPPORT)
PREFILLED_SYRINGE | INTRAVENOUS | Status: DC | PRN
  Administered 2021-03-10: 80 ug via INTRAVENOUS

## 2021-03-10 MED ORDER — PROMETHAZINE HCL 25 MG/ML IJ SOLN: 6.2500 mg | INTRAMUSCULAR | Status: DC | PRN

## 2021-03-10 MED ORDER — 0.9 % SODIUM CHLORIDE (POUR BTL) OPTIME
TOPICAL | Status: DC | PRN
Start: 2021-03-10 — End: 2021-03-10
  Administered 2021-03-10: 1000 mL

## 2021-03-10 MED ORDER — LACTATED RINGERS IV SOLN: INTRAVENOUS | Status: DC | PRN

## 2021-03-10 MED ORDER — FENTANYL CITRATE (PF) 100 MCG/2ML IJ SOLN
25.0000 ug | INTRAMUSCULAR | Status: DC | PRN
  Administered 2021-03-10: 25 ug via INTRAVENOUS

## 2021-03-10 MED ORDER — ONDANSETRON HCL 4 MG/2ML IJ SOLN
INTRAMUSCULAR | Status: AC
  Filled 2021-03-10: qty 2

## 2021-03-10 MED ORDER — LIDOCAINE-EPINEPHRINE 1 %-1:100000 IJ SOLN
INTRAMUSCULAR | Status: AC
  Filled 2021-03-10: qty 1

## 2021-03-10 MED ORDER — ROCURONIUM BROMIDE 100 MG/10ML IV SOLN
INTRAVENOUS | Status: DC | PRN
  Administered 2021-03-10: 60 mg via INTRAVENOUS

## 2021-03-10 SURGICAL SUPPLY — 39 items
BAG COUNTER SPONGE SURGICOUNT (BAG) ×3 IMPLANT
BLADE CLIPPER SURG (BLADE) IMPLANT
BLADE SURG 15 STRL LF DISP TIS (BLADE) ×2 IMPLANT
BLADE SURG 15 STRL SS (BLADE) ×3
CANISTER SUCT 3000ML PPV (MISCELLANEOUS) IMPLANT
DECANTER SPIKE VIAL GLASS SM (MISCELLANEOUS) ×3 IMPLANT
DERMABOND ADVANCED (GAUZE/BANDAGES/DRESSINGS)
DERMABOND ADVANCED .7 DNX12 (GAUZE/BANDAGES/DRESSINGS) IMPLANT
DRAPE LAPAROTOMY 100X72 PEDS (DRAPES) IMPLANT
DRAPE U-SHAPE 76X120 STRL (DRAPES) IMPLANT
DRSG TEGADERM 2-3/8X2-3/4 SM (GAUZE/BANDAGES/DRESSINGS) IMPLANT
ELECT CAUTERY BLADE 6.4 (BLADE) IMPLANT
ELECT COATED BLADE 2.86 ST (ELECTRODE) IMPLANT
ELECT NDL BLADE 2-5/6 (NEEDLE) IMPLANT
ELECT NEEDLE BLADE 2-5/6 (NEEDLE) IMPLANT
ELECT REM PT RETURN 9FT ADLT (ELECTROSURGICAL) ×3
ELECTRODE REM PT RTRN 9FT ADLT (ELECTROSURGICAL) ×2 IMPLANT
GAUZE SPONGE 4X4 12PLY STRL LF (GAUZE/BANDAGES/DRESSINGS) ×3 IMPLANT
GLOVE SURG ENC MOIS LTX SZ6.5 (GLOVE) ×6 IMPLANT
GLOVE SURG ENC MOIS LTX SZ7 (GLOVE) ×6 IMPLANT
GLOVE SURG ENC MOIS LTX SZ7.5 (GLOVE) ×6 IMPLANT
GOWN STRL REUS W/ TWL LRG LVL3 (GOWN DISPOSABLE) ×6 IMPLANT
GOWN STRL REUS W/TWL LRG LVL3 (GOWN DISPOSABLE) ×9
KIT BASIN OR (CUSTOM PROCEDURE TRAY) ×3 IMPLANT
NDL HYPO 25GX1X1/2 BEV (NEEDLE) ×2 IMPLANT
NEEDLE HYPO 25GX1X1/2 BEV (NEEDLE) ×3 IMPLANT
NS IRRIG 1000ML POUR BTL (IV SOLUTION) ×3 IMPLANT
PACK SURGICAL SETUP 50X90 (CUSTOM PROCEDURE TRAY) ×3 IMPLANT
PENCIL SMOKE EVACUATOR (MISCELLANEOUS) ×3 IMPLANT
STRIP CLOSURE SKIN 1/2X4 (GAUZE/BANDAGES/DRESSINGS) IMPLANT
SUT MON AB 4-0 PC3 18 (SUTURE) ×3 IMPLANT
SUT PDS AB 3-0 SH 27 (SUTURE) ×1 IMPLANT
SUT PLAIN GUT FAST 5-0 (SUTURE) ×3 IMPLANT
SUT VLOC 180 3-0 18IN (SUTURE) ×1 IMPLANT
SYR BULB EAR ULCER 3OZ GRN STR (SYRINGE) ×3 IMPLANT
SYR CONTROL 10ML LL (SYRINGE) ×3 IMPLANT
TOWEL GREEN STERILE FF (TOWEL DISPOSABLE) ×3 IMPLANT
TUBE CONNECTING 12X1/4 (SUCTIONS) ×3 IMPLANT
YANKAUER SUCT BULB TIP NO VENT (SUCTIONS) ×3 IMPLANT

## 2021-03-10 NOTE — Anesthesia Procedure Notes (Addendum)
Procedure Name: Intubation Date/Time: 03/10/2021 2:20 PM Performed by: Cathren Harsh, CRNA Pre-anesthesia Checklist: Patient identified, Emergency Drugs available, Suction available and Patient being monitored Patient Re-evaluated:Patient Re-evaluated prior to induction Oxygen Delivery Method: Circle System Utilized Preoxygenation: Pre-oxygenation with 100% oxygen Induction Type: IV induction Ventilation: Mask ventilation without difficulty Laryngoscope Size: Mac and 4 Grade View: Grade I Tube type: Oral Tube size: 7.0 mm Number of attempts: 1 Airway Equipment and Method: Stylet and Oral airway Placement Confirmation: ETT inserted through vocal cords under direct vision, positive ETCO2 and breath sounds checked- equal and bilateral Secured at: 21 cm Tube secured with: Tape Dental Injury: Teeth and Oropharynx as per pre-operative assessment

## 2021-03-10 NOTE — Anesthesia Preprocedure Evaluation (Signed)
Anesthesia Evaluation  Patient identified by MRN, date of birth, ID band Patient awake    Reviewed: Allergy & Precautions, NPO status , Patient's Chart, lab work & pertinent test results  Airway Mallampati: III  TM Distance: >3 FB Neck ROM: Full    Dental no notable dental hx.    Pulmonary neg pulmonary ROS,    Pulmonary exam normal        Cardiovascular hypertension, Pt. on medications  Rhythm:Regular Rate:Normal     Neuro/Psych negative neurological ROS  negative psych ROS   GI/Hepatic negative GI ROS, Neg liver ROS,   Endo/Other  Morbid obesity  Renal/GU negative Renal ROS  negative genitourinary   Musculoskeletal Back lipoma, buttocks lipoma   Abdominal Normal abdominal exam  (+)   Peds  Hematology  (+) anemia ,   Anesthesia Other Findings   Reproductive/Obstetrics                             Anesthesia Physical Anesthesia Plan  ASA: 3  Anesthesia Plan: General   Post-op Pain Management:    Induction: Intravenous  PONV Risk Score and Plan: 3 and Ondansetron, Dexamethasone, Midazolam and Treatment may vary due to age or medical condition  Airway Management Planned: Mask and Oral ETT  Additional Equipment: None  Intra-op Plan:   Post-operative Plan: Extubation in OR  Informed Consent: I have reviewed the patients History and Physical, chart, labs and discussed the procedure including the risks, benefits and alternatives for the proposed anesthesia with the patient or authorized representative who has indicated his/her understanding and acceptance.     Dental advisory given  Plan Discussed with: CRNA  Anesthesia Plan Comments: (Lab Results      Component                Value               Date                      WBC                      5.2                 03/10/2021                HGB                      11.5 (L)            03/10/2021                HCT                       34.1 (L)            03/10/2021                MCV                      94.5                03/10/2021                PLT                      305  03/10/2021           Lab Results      Component                Value               Date                      NA                       140                 03/10/2021                K                        3.3 (L)             03/10/2021                CO2                      24                  03/10/2021                GLUCOSE                  101 (H)             03/10/2021                BUN                      8                   03/10/2021                CREATININE               1.15 (H)            03/10/2021                CALCIUM                  8.4 (L)             03/10/2021                GFRNONAA                 >60                 03/10/2021          )        Anesthesia Quick Evaluation

## 2021-03-10 NOTE — Anesthesia Postprocedure Evaluation (Signed)
Anesthesia Post Note  Patient: Garnetta Brewton  Procedure(s) Performed: EXCISION LEFT UPPER BACK MASS (Left: Back) EXCISION RIGHT BUTTOCK SKIN LESION (Right: Back)     Patient location during evaluation: PACU Anesthesia Type: General Level of consciousness: awake and alert Pain management: pain level controlled Vital Signs Assessment: post-procedure vital signs reviewed and stable Respiratory status: spontaneous breathing, nonlabored ventilation, respiratory function stable and patient connected to nasal cannula oxygen Cardiovascular status: blood pressure returned to baseline and stable Postop Assessment: no apparent nausea or vomiting Anesthetic complications: no   No notable events documented.  Last Vitals:  Vitals:   03/10/21 1615 03/10/21 1630  BP: (!) 145/96 (!) 143/87  Pulse: 86 84  Resp: 18 15  Temp:    SpO2: 99% 99%    Last Pain:  Vitals:   03/10/21 1615  TempSrc:   PainSc: 9                  Genene Kilman P [place]

## 2021-03-10 NOTE — Telephone Encounter (Signed)
Patient called to say her pharmacy does not have the medication in stock that we prescribed for her.  She asked that we please send her prescription to the [facility] on [address].

## 2021-03-10 NOTE — Op Note (Signed)
Operative Note   DATE OF OPERATION: 03/10/2021  SURGICAL DEPARTMENT: Plastic Surgery  PREOPERATIVE DIAGNOSES: 1.  Left upper back lipoma 2.  Right buttock skin lesion  POSTOPERATIVE DIAGNOSES:  same  PROCEDURE: 1.  Excision subfascial left upper back lipoma 8 cm in size 2.  Excision right buttock skin lesion totaling 4 cm in length 3.  Complex closure both areas totaling 12 cm  SURGEON: Talmadge Coventry, MD  ASSISTANT: Krista Blue, PA The advanced practice practitioner (APP) assisted throughout the case.  The APP was essential in retraction and counter traction when needed to make the case progress smoothly.  This retraction and assistance made it possible to see the tissue planes for the procedure.  The assistance was needed for hemostasis, tissue re-approximation and closure of the incision site.   ANESTHESIA:  General.   COMPLICATIONS: None.   INDICATIONS FOR PROCEDURE:  The patient, Jennifer Booth is a 36 y.o. female born on Jun 27, 1985, is here for treatment of symptomatic left upper back lipoma and right buttock skin lesion MRN: [id number]  CONSENT:  Informed consent was obtained directly from the patient. Risks, benefits and alternatives were fully discussed. Specific risks including but not limited to bleeding, infection, hematoma, seroma, scarring, pain, contracture, asymmetry, wound healing problems, and need for further surgery were all discussed. The patient did have an ample opportunity to have questions answered to satisfaction.   DESCRIPTION OF PROCEDURE:  The patient was taken to the operating room. SCDs were placed and antibiotics were given.  General anesthesia was administered.  The patient's operative site was prepped and draped in a sterile fashion. A time out was performed and all information was confirmed to be correct.  Started by examining both areas.  The left upper back had a 8 cm freely mobile mass.  An incision was marked out and local anesthetic was  infiltrated.  I marked an ellipse around the right buttock skin lesion parallel to the skin tension lines.  This was approximately 4 cm in size.  Local anesthetic was infiltrated in that area as well.  I then turned my attention to the left upper back.  Elliptical skin excision was performed.  I then dissected down to the fascia with cautery and was able to feel the mass deep to this level.  Fascia was then incised with cautery and a fatty 8 cm mass was able to be removed using mostly blunt combined with cautery dissection.  No additional masses were able to be palpated.  Skin was undermined circumferentially and advanced and closed with interrupted buried 3-0 PDS sutures and a running 3 OV lock.  Steri-Strips and gauze dressing were applied.  Intermittent to the right buttock.  The pedunculated skin lesion was excised full-thickness with a 15 blade.  Hemostasis was obtained.  Surrounding skin was then undermined and advanced and closed in layers with erupted buried 3-0 Monocryl in a running 3-0 Monocryl suture.  Soft dressing was applied.  The patient tolerated the procedure well.  There were no complications. The patient was allowed to wake from anesthesia, extubated and taken to the recovery room in satisfactory condition.

## 2021-03-10 NOTE — Transfer of Care (Signed)
Immediate Anesthesia Transfer of Care Note  Patient: Jennifer Booth  Procedure(s) Performed: EXCISION LEFT UPPER BACK MASS (Left: Back) EXCISION RIGHT BUTTOCK SKIN LESION (Right: Back)  Patient Location: PACU  Anesthesia Type:General  Level of Consciousness: awake, drowsy and patient cooperative  Airway & Oxygen Therapy: Patient Spontanous Breathing and Patient connected to nasal cannula oxygen  Post-op Assessment: Report given to RN and Post -op Vital signs reviewed and stable  Post vital signs: Reviewed and stable  Last Vitals:  Vitals Value Taken Time  BP 156/105 03/10/21 1511  Temp    Pulse 104 03/10/21 1515  Resp 18 03/10/21 1515  SpO2 100 % 03/10/21 1515  Vitals shown include unvalidated device data.  Last Pain:  Vitals:   03/10/21 1148  TempSrc:   PainSc: 0-No pain         Complications: No notable events documented.

## 2021-03-10 NOTE — Telephone Encounter (Signed)
OK, called them into the [facility] on [address]. Thanks

## 2021-03-10 NOTE — Telephone Encounter (Signed)
I l/m for patient informing med called in to alternate pharmacy as requested.

## 2021-03-10 NOTE — Interval H&P Note (Signed)
Patient seen and examined. Risks and benefits discussed. Proceed with surgery.

## 2021-03-10 NOTE — Discharge Instructions (Signed)
Activity: As tolerated, but avoid strenuous activity until follow up visit.  Diet: Regular  Wound Care: Keep dressing clean & dry for 2 days.  After that you can shower normally.  Steri-Strips were placed over excision site on back. Please keep them intact - we will remove them for you at one of your post-operative appointments.  Your buttock excision was repaired only with sutures.  Recommending that you change the dressings by placing gauze and securing with Medipore tape every other day, or sooner if needed due to bleeding.    Special Instructions:  Call our office if any unusual problems occur such as pain, excessive bleeding, unrelieved nausea/vomiting, fever &/or chills.  Follow-up appointment: Scheduled for next week.

## 2021-03-11 ENCOUNTER — Encounter (HOSPITAL_COMMUNITY): Payer: Self-pay | Admitting: Plastic Surgery

## 2021-03-12 ENCOUNTER — Encounter: Payer: Medicaid Other | Admitting: Physician Assistant

## 2021-03-12 LAB — SURGICAL PATHOLOGY

## 2021-03-17 ENCOUNTER — Ambulatory Visit (INDEPENDENT_AMBULATORY_CARE_PROVIDER_SITE_OTHER): Payer: Medicaid Other | Admitting: Surgical

## 2021-03-17 ENCOUNTER — Other Ambulatory Visit: Payer: Self-pay

## 2021-03-17 DIAGNOSIS — D171 Benign lipomatous neoplasm of skin and subcutaneous tissue of trunk: Secondary | ICD-10-CM

## 2021-03-17 MED ORDER — MELOXICAM 7.5 MG PO TABS: 7.5000 mg | ORAL_TABLET | Freq: Every day | ORAL | 0 refills | Status: DC

## 2021-03-17 NOTE — Progress Notes (Signed)
Patient is a 36 year old female here for follow-up after excision of left upper back mass and right buttock skin lesion with Dr. Claudia Desanctis on 03/10/2021.  She reports overall she is doing well.  Her appointment was for tomorrow, however she had her dates mixed up.  Pathology reviewed with patient.  She reports that she is having some tenderness of the left back, reports that she has been using Tylenol and ibuprofen and this has not been helpful.  She has some questions about trying alternative medications for helping.  She is not having any infectious symptoms.  Chaperone present on exam On exam left back incision is intact and healing really well.  There is no erythema or subcutaneous fluid collection noted palpation.  On exam right buttock incision is healing well, running suture is noted.  Incision is CDI.  There is no erythema.  No subcutaneous fluid collection noted.  Right buttock sutures were removed, patient tolerated this well.  There is no signs of dehiscence on exam.  Discussed restrictions.  Avoid heavy activities.  Recommend following up as needed.  We can try a short course of meloxicam to help with pain control.  Patient is aware to not use meloxicam and ibuprofen together.

## 2021-03-18 ENCOUNTER — Encounter: Payer: Medicaid Other | Admitting: Plastic Surgery

## 2021-04-01 ENCOUNTER — Encounter: Payer: Medicaid Other | Admitting: Plastic Surgery

## 2021-04-07 ENCOUNTER — Encounter (HOSPITAL_BASED_OUTPATIENT_CLINIC_OR_DEPARTMENT_OTHER): Payer: Self-pay | Admitting: Urology

## 2021-04-07 ENCOUNTER — Other Ambulatory Visit: Payer: Self-pay

## 2021-04-07 ENCOUNTER — Emergency Department (HOSPITAL_BASED_OUTPATIENT_CLINIC_OR_DEPARTMENT_OTHER)
Admission: EM | Admit: 2021-04-07 | Discharge: 2021-04-08 | Disposition: A | Discharge status: Home / Self Care | Payer: Medicaid Other | Attending: Emergency Medicine | Admitting: Emergency Medicine

## 2021-04-07 DIAGNOSIS — Z5321 Procedure and treatment not carried out due to patient leaving prior to being seen by health care provider: Secondary | ICD-10-CM | POA: Diagnosis not present

## 2021-04-07 DIAGNOSIS — R42 Dizziness and giddiness: Secondary | ICD-10-CM | POA: Diagnosis not present

## 2021-04-07 DIAGNOSIS — R112 Nausea with vomiting, unspecified: Secondary | ICD-10-CM | POA: Diagnosis not present

## 2021-04-07 DIAGNOSIS — R1010 Upper abdominal pain, unspecified: Secondary | ICD-10-CM | POA: Diagnosis not present

## 2021-04-07 LAB — CBC WITH DIFFERENTIAL/PLATELET
Abs Immature Granulocytes: 0.01 10*3/uL (ref 0.00–0.07)
Basophils Absolute: 0 10*3/uL (ref 0.0–0.1)
Basophils Relative: 1 %
Eosinophils Absolute: 0.1 10*3/uL (ref 0.0–0.5)
Eosinophils Relative: 2 %
HCT: 36.5 % (ref 36.0–46.0)
Hemoglobin: 12.1 g/dL (ref 12.0–15.0)
Immature Granulocytes: 0 %
Lymphocytes Relative: 52 %
Lymphs Abs: 3.3 10*3/uL (ref 0.7–4.0)
MCH: 30.9 pg (ref 26.0–34.0)
MCHC: 33.2 g/dL (ref 30.0–36.0)
MCV: 93.1 fL (ref 80.0–100.0)
Monocytes Absolute: 0.3 10*3/uL (ref 0.1–1.0)
Monocytes Relative: 5 %
Neutro Abs: 2.5 10*3/uL (ref 1.7–7.7)
Neutrophils Relative %: 40 %
Platelets: 362 10*3/uL (ref 150–400)
RBC: 3.92 MIL/uL (ref 3.87–5.11)
RDW: 14.5 % (ref 11.5–15.5)
WBC: 6.3 10*3/uL (ref 4.0–10.5)
nRBC: 0 % (ref 0.0–0.2)

## 2021-04-07 LAB — COMPREHENSIVE METABOLIC PANEL
ALT: 11 U/L (ref 0–44)
AST: 15 U/L (ref 15–41)
Albumin: 4 g/dL (ref 3.5–5.0)
Alkaline Phosphatase: 90 U/L (ref 38–126)
Anion gap: 8 (ref 5–15)
BUN: 13 mg/dL (ref 6–20)
CO2: 27 mmol/L (ref 22–32)
Calcium: 9.4 mg/dL (ref 8.9–10.3)
Chloride: 104 mmol/L (ref 98–111)
Creatinine, Ser: 1.01 mg/dL — ABNORMAL HIGH (ref 0.44–1.00)
GFR, Estimated: >60 mL/min (ref >60)
Glucose, Bld: 102 mg/dL — ABNORMAL HIGH (ref 70–99)
Potassium: 3.2 mmol/L — ABNORMAL LOW (ref 3.5–5.1)
Sodium: 139 mmol/L (ref 135–145)
Total Bilirubin: 0.4 mg/dL (ref 0.3–1.2)
Total Protein: 7.1 g/dL (ref 6.5–8.1)

## 2021-04-07 LAB — URINALYSIS, ROUTINE W REFLEX MICROSCOPIC
Bilirubin Urine: NEGATIVE
Glucose, UA: NEGATIVE mg/dL
Hgb urine dipstick: NEGATIVE
Ketones, ur: NEGATIVE mg/dL
Leukocytes,Ua: NEGATIVE
Nitrite: NEGATIVE
Specific Gravity, Urine: 1.024 (ref 1.005–1.030)
pH: 6 (ref 5.0–8.0)

## 2021-04-07 LAB — PREGNANCY, URINE: Preg Test, Ur: NEGATIVE

## 2021-04-07 LAB — LIPASE, BLOOD: Lipase: 23 U/L (ref 11–51)

## 2021-04-07 NOTE — ED Triage Notes (Signed)
Nausea and vomiting that started yesterday  Severe upper abdomen and lightheadedness that started at 1200  Hx of cholecystectomy last year

## 2021-04-13 ENCOUNTER — Ambulatory Visit (HOSPITAL_COMMUNITY)
Admission: EM | Admit: 2021-04-13 | Discharge: 2021-04-13 | Disposition: A | Discharge status: Home / Self Care | Payer: Medicaid Other | Attending: Internal Medicine | Admitting: Internal Medicine

## 2021-04-13 ENCOUNTER — Encounter (HOSPITAL_COMMUNITY): Payer: Self-pay | Admitting: *Deleted

## 2021-04-13 ENCOUNTER — Other Ambulatory Visit: Payer: Self-pay

## 2021-04-13 DIAGNOSIS — E1110 Type 2 diabetes mellitus with ketoacidosis without coma: Secondary | ICD-10-CM | POA: Diagnosis not present

## 2021-04-13 DIAGNOSIS — E669 Obesity, unspecified: Secondary | ICD-10-CM | POA: Insufficient documentation

## 2021-04-13 DIAGNOSIS — E1169 Type 2 diabetes mellitus with other specified complication: Secondary | ICD-10-CM

## 2021-04-13 DIAGNOSIS — E1159 Type 2 diabetes mellitus with other circulatory complications: Secondary | ICD-10-CM | POA: Diagnosis not present

## 2021-04-13 DIAGNOSIS — I10 Essential (primary) hypertension: Secondary | ICD-10-CM | POA: Diagnosis not present

## 2021-04-13 DIAGNOSIS — Z794 Long term (current) use of insulin: Secondary | ICD-10-CM

## 2021-04-13 LAB — COMPREHENSIVE METABOLIC PANEL
ALT: 17 U/L (ref 0–44)
AST: 21 U/L (ref 15–41)
Albumin: 3.7 g/dL (ref 3.5–5.0)
Alkaline Phosphatase: 88 U/L (ref 38–126)
Anion gap: 9 (ref 5–15)
BUN: 10 mg/dL (ref 6–20)
CO2: 28 mmol/L (ref 22–32)
Calcium: 8.8 mg/dL — ABNORMAL LOW (ref 8.9–10.3)
Chloride: 101 mmol/L (ref 98–111)
Creatinine, Ser: 1.07 mg/dL — ABNORMAL HIGH (ref 0.44–1.00)
GFR, Estimated: >60 mL/min (ref >60)
Glucose, Bld: 101 mg/dL — ABNORMAL HIGH (ref 70–99)
Potassium: 3.2 mmol/L — ABNORMAL LOW (ref 3.5–5.1)
Sodium: 138 mmol/L (ref 135–145)
Total Bilirubin: 0.1 mg/dL — ABNORMAL LOW (ref 0.3–1.2)
Total Protein: 6.7 g/dL (ref 6.5–8.1)

## 2021-04-13 LAB — HEMOGLOBIN A1C
Hgb A1c MFr Bld: 5.3 % (ref 4.8–5.6)
Mean Plasma Glucose: 105.41 mg/dL

## 2021-04-13 MED ORDER — MOUNJARO 2.5 MG/0.5ML ~~LOC~~ SOAJ: 2.5000 mg | SUBCUTANEOUS | 0 refills | Status: AC

## 2021-04-13 NOTE — Discharge Instructions (Signed)
Take your BP med every morning ?I will inform you of your results via mychart on my next shift.  ?

## 2021-04-13 NOTE — ED Provider Notes (Addendum)
?[facility] ? ? ? ?CSN: [id number] ?Arrival date & time: 04/13/21  1511 ? ? ?  ? ?History   ?Chief Complaint ?Chief Complaint  ?Patient presents with  ? Abdominal Pain  ?  LT  ? Medication Refill  ? ? ?HPI ?Jennifer Booth is a 36 y.o. female who presents with a couple of issues ?1- FU LLQ pain x 4 days and was seen in urgent care  on 3/2 and had labs done with normal CBC and CMP showed elevated creatinine, but the rest was normal.  She describes the pain as dull. She was having diarrhea which has slowed down and this pain gets better after having BM's. Only had one today. Pain today was 8/10. But now is only 2/10. Denies fever, N/V.  ? ?2- Needs her DM meds refilled by a Medicaid approved provider. Her new PCP is out of network and sent the rx but the pharmacy told her that since she is not medicaid provider, pt has to pay cash for this medication and was told to come to a [facility] provider.  ?   Has been out x 3 weeks and her glucose has been up to 150  ?Has been on this medication x 3 months  and has lost 22 lbs since on it. Her last HGBA1C was 6.4 3 months ago. She admits she has not had much to drink today.  ? ? ? ?Past Medical History:  ?Diagnosis Date  ? Anemia   ? History of blood transfusion 2018  ? post CSection  ? Pre-diabetes   ? Pre-eclampsia 2018  ? ? ?Patient Active Problem List  ? Diagnosis Date Noted  ? Cholelithiasis with chronic cholecystitis 08/13/2020  ? Biliary colic 22/29/7989  ? Essential hypertension 08/12/2020  ? Obesity, Class III, BMI 40-49.9 (morbid obesity) ([place]) 08/12/2020  ? Nausea and vomiting 08/12/2020  ? ASCUS with positive high risk HPV cervical 07/13/2018  ? Anemia of pregnancy 06/02/2018  ? Mild pre-eclampsia 06/01/2018  ? VBAC, delivered 06/01/2018  ? H/O pre-eclampsia in prior pregnancy, currently pregnant 05/03/2018  ? Unwanted fertility 05/03/2018  ? History of C-section 04/24/2018  ? Late prenatal care affecting pregnancy in third trimester 04/19/2018  ? History of  preterm delivery 04/19/2018  ? History of twin pregnancy in prior pregnancy 04/19/2018  ? Supervision of other normal pregnancy, antepartum 04/18/2018  ? ? ?Past Surgical History:  ?Procedure Laterality Date  ? CESAREAN SECTION    ? CHOLECYSTECTOMY N/A 08/13/2020  ? Procedure: LAPAROSCOPIC CHOLECYSTECTOMY WITH INTRAOPERATIVE CHOLANGIOGRAM;  Surgeon: Armandina Gemma, MD;  Location: [facility];  Service: General;  Laterality: N/A;  ? LAPAROSCOPIC TUBAL LIGATION Bilateral 08/02/2018  ? Procedure: LAPAROSCOPIC TUBAL LIGATION WITH FILSHIE CLIPS;  Surgeon: Sloan Leiter, MD;  Location: [facility];  Service: Gynecology;  Laterality: Bilateral;  ? MASS EXCISION Left 03/10/2021  ? Procedure: EXCISION LEFT UPPER BACK MASS;  Surgeon: [name], MD;  Location: [facility];  Service: Plastics;  Laterality: Left;  1 HOUR  ? NEVUS EXCISION Right 03/10/2021  ? Procedure: EXCISION RIGHT BUTTOCK SKIN LESION;  Surgeon: [name], MD;  Location: [facility];  Service: Plastics;  Laterality: Right;  ? ? ?OB History   ? ? Gravida  ?4  ? Para  ?4  ? Term  ?3  ? Preterm  ?1  ? AB  ?0  ? Living  ?5  ?  ? ? SAB  ?0  ? IAB  ?0  ? Ectopic  ?0  ?  Multiple  ?1  ? Live Births  ?5  ?   ?  ?  ? ? ? ?Home Medications   ? ?Prior to Admission medications   ?Medication Sig Start Date End Date Taking? Authorizing Provider  ?tirzepatide [facility]) 2.5 MG/0.5ML Pen Inject 2.5 mg into the skin once a week. 04/13/21  Yes Rodriguez-Southworth, Sunday Spillers, PA-C  ?amLODipine (NORVASC) 10 MG tablet Take 10 mg by mouth daily. 07/17/20   [provider]  ?hydrochlorothiazide (HYDRODIURIL) 12.5 MG tablet Take 12.5 mg by mouth every morning. 07/15/20   [provider]  ?meloxicam (MOBIC) 7.5 MG tablet Take 1 tablet (7.5 mg total) by mouth daily. 03/17/21   Scheeler, Carola Rhine, PA-C  ?promethazine (PHENERGAN) 12.5 MG tablet Take 1 tablet (12.5 mg total) by mouth every 8 (eight) hours as needed for nausea or vomiting. 03/04/21   Corena Herter, PA-C   ? ? ?Family History ?Family History  ?Problem Relation Age of Onset  ? Hypertension Mother   ? Diabetes Mother   ? Hypercholesterolemia Father   ? Hypertension Father   ? Heart disease Maternal Grandmother   ? Diabetes Maternal Grandmother   ? ? ?Social History ?Social History  ? ?Tobacco Use  ? Smoking status: Never  ? Smokeless tobacco: Never  ?Vaping Use  ? Vaping Use: Never used  ?Substance Use Topics  ? Alcohol use: Not Currently  ? Drug use: Never  ? ? ? ?Allergies   ?Ciprofloxacin and Zofran [ondansetron hcl] ? ? ?Review of Systems ?Review of Systems  ?Constitutional:  Negative for diaphoresis, fatigue and fever.  ?Cardiovascular:   ?     Hx of HTN and takes her BP med qpm, and has not taken it today.   ?Gastrointestinal:  Positive for abdominal pain and diarrhea. Negative for blood in stool, nausea and vomiting.  ?Endocrine: Negative for polydipsia.  ?     Hyperglycemia  ?Genitourinary:  Negative for menstrual problem.  ? ? ?Physical Exam ?Triage Vital Signs ?ED Triage Vitals  ?Enc Vitals Group  ?   BP --   ?   Pulse Rate 04/13/21 1603 79  ?   Resp 04/13/21 1603 20  ?   Temp 04/13/21 1603 98.1 ?F (36.7 ?C)  ?   Temp src --   ?   SpO2 04/13/21 1603 98 %  ?   Weight --   ?   Height --   ?   Head Circumference --   ?   Peak Flow --   ?   Pain Score 04/13/21 1601 3  ?   Pain Loc --   ?   Pain Edu? --   ?   Excl. in [place]? --   ? ?No data found. ? ?Updated Vital Signs ?BP (S) (!) 150/120 Comment: BP checked 2 times with diffrent cuffs  Pulse 79   Temp 98.1 ?F (36.7 ?C)   Resp 20   LMP 03/31/2021 (Exact Date)   SpO2 98%  ? ?Visual Acuity ?Right Eye Distance:   ?Left Eye Distance:   ?Bilateral Distance:   ? ?Right Eye Near:   ?Left Eye Near:    ?Bilateral Near:    ? ?Physical Exam ?Vitals and nursing note reviewed.  ?Constitutional:   ?   General: She is not in acute distress. ?   Appearance: She is obese. She is not toxic-appearing.  ?HENT:  ?   Right Ear: External ear normal.  ?   Left Ear: External ear  normal.  ?Eyes:  ?  General: No scleral icterus. ?   Conjunctiva/sclera: Conjunctivae normal.  ?Pulmonary:  ?   Effort: Pulmonary effort is normal.  ?Abdominal:  ?   General: Bowel sounds are normal.  ?   Palpations: Abdomen is soft.  ?   Tenderness: There is no guarding or rebound.  ?   Comments: Has mild tenderness on LLQ  ?Musculoskeletal:     ?   General: Normal range of motion.  ?Skin: ?   General: Skin is warm and dry.  ?   Findings: No rash.  ?Neurological:  ?   Mental Status: She is alert and oriented to person, place, and time.  ?   Gait: Gait normal.  ?Psychiatric:     ?   Mood and Affect: Mood normal.     ?   Behavior: Behavior normal.     ?   Thought Content: Thought content normal.     ?   Judgment: Judgment normal.  ? ? ? ?UC Treatments / Results  ?Labs ?(all labs ordered are listed, but only abnormal results are displayed) ?Labs Reviewed  ?COMPREHENSIVE METABOLIC PANEL  ?HEMOGLOBIN A1C  ? ? ?EKG ? ? ?Radiology ?No results found. ? ?Procedures ?Procedures (including critical care time) ? ?Medications Ordered in UC ?Medications - No data to display ? ?Initial Impression / Assessment and Plan / UC Course  ?I have reviewed the triage vital signs and the nursing notes. ?DM type 2 ?Hx of HTN ?Hx Elevated creatinine  ?LLQ pain with resolving diarrhea more likely from viral GI bug ?I refilled her Mounjaro for a month ?I have requested for [place] to help her find a PCP ?I ordered CMP and HGBA1C and I will inform her of the results on my next shift.  ? ?  ? ?Final Clinical Impressions(s) / UC Diagnoses  ? ?Final diagnoses:  ?DM (diabetes mellitus) type 2, uncontrolled, with ketoacidosis ([place])  ? ?Discharge Instructions   ?None ?  ? ?ED Prescriptions   ? ? Medication Sig Dispense Auth. Provider  ? tirzepatide [facility]) 2.5 MG/0.5ML Pen Inject 2.5 mg into the skin once a week. 4 mL Rodriguez-Southworth, Sunday Spillers, PA-C  ? ?  ? ?PDMP not reviewed this encounter. ?  ?Shelby Mattocks, PA-C ?04/13/21  1735 ? ?  ?Shelby Mattocks, PA-C ?04/13/21 1735 ? ?

## 2021-04-13 NOTE — ED Triage Notes (Signed)
Pt reports LT lower ABD pauin for 2 days . Pt reports pain is dull. Pt also needs a [employer] approved provider to refill a diabetic med. ?

## 2021-04-16 ENCOUNTER — Other Ambulatory Visit: Payer: Self-pay

## 2021-04-16 ENCOUNTER — Encounter (HOSPITAL_BASED_OUTPATIENT_CLINIC_OR_DEPARTMENT_OTHER): Payer: Self-pay | Admitting: Emergency Medicine

## 2021-04-16 ENCOUNTER — Emergency Department (HOSPITAL_BASED_OUTPATIENT_CLINIC_OR_DEPARTMENT_OTHER): Payer: Medicaid Other

## 2021-04-16 ENCOUNTER — Emergency Department (HOSPITAL_BASED_OUTPATIENT_CLINIC_OR_DEPARTMENT_OTHER)
Admission: EM | Admit: 2021-04-16 | Discharge: 2021-04-17 | Disposition: A | Discharge status: Home / Self Care | Payer: Medicaid Other | Attending: Emergency Medicine | Admitting: Emergency Medicine

## 2021-04-16 DIAGNOSIS — R079 Chest pain, unspecified: Secondary | ICD-10-CM | POA: Diagnosis not present

## 2021-04-16 DIAGNOSIS — I10 Essential (primary) hypertension: Secondary | ICD-10-CM | POA: Insufficient documentation

## 2021-04-16 DIAGNOSIS — R1013 Epigastric pain: Secondary | ICD-10-CM

## 2021-04-16 DIAGNOSIS — Z79899 Other long term (current) drug therapy: Secondary | ICD-10-CM | POA: Diagnosis not present

## 2021-04-16 DIAGNOSIS — K920 Hematemesis: Secondary | ICD-10-CM | POA: Diagnosis not present

## 2021-04-16 LAB — COMPREHENSIVE METABOLIC PANEL
ALT: 15 U/L (ref 0–44)
AST: 17 U/L (ref 15–41)
Albumin: 3.8 g/dL (ref 3.5–5.0)
Alkaline Phosphatase: 90 U/L (ref 38–126)
Anion gap: 9 (ref 5–15)
BUN: 9 mg/dL (ref 6–20)
CO2: 26 mmol/L (ref 22–32)
Calcium: 8.6 mg/dL — ABNORMAL LOW (ref 8.9–10.3)
Chloride: 104 mmol/L (ref 98–111)
Creatinine, Ser: 1.03 mg/dL — ABNORMAL HIGH (ref 0.44–1.00)
GFR, Estimated: >60 mL/min (ref >60)
Glucose, Bld: 106 mg/dL — ABNORMAL HIGH (ref 70–99)
Potassium: 3.4 mmol/L — ABNORMAL LOW (ref 3.5–5.1)
Sodium: 139 mmol/L (ref 135–145)
Total Bilirubin: 0.3 mg/dL (ref 0.3–1.2)
Total Protein: 7 g/dL (ref 6.5–8.1)

## 2021-04-16 LAB — CBC WITH DIFFERENTIAL/PLATELET
Abs Immature Granulocytes: 0.01 10*3/uL (ref 0.00–0.07)
Basophils Absolute: 0 10*3/uL (ref 0.0–0.1)
Basophils Relative: 0 %
Eosinophils Absolute: 0.1 10*3/uL (ref 0.0–0.5)
Eosinophils Relative: 2 %
HCT: 33.2 % — ABNORMAL LOW (ref 36.0–46.0)
Hemoglobin: 11.1 g/dL — ABNORMAL LOW (ref 12.0–15.0)
Immature Granulocytes: 0 %
Lymphocytes Relative: 49 %
Lymphs Abs: 3.1 10*3/uL (ref 0.7–4.0)
MCH: 31.2 pg (ref 26.0–34.0)
MCHC: 33.4 g/dL (ref 30.0–36.0)
MCV: 93.3 fL (ref 80.0–100.0)
Monocytes Absolute: 0.4 10*3/uL (ref 0.1–1.0)
Monocytes Relative: 7 %
Neutro Abs: 2.7 10*3/uL (ref 1.7–7.7)
Neutrophils Relative %: 42 %
Platelets: 307 10*3/uL (ref 150–400)
RBC: 3.56 MIL/uL — ABNORMAL LOW (ref 3.87–5.11)
RDW: 14.3 % (ref 11.5–15.5)
WBC: 6.3 10*3/uL (ref 4.0–10.5)
nRBC: 0 % (ref 0.0–0.2)

## 2021-04-16 LAB — HCG, SERUM, QUALITATIVE: Preg, Serum: NEGATIVE

## 2021-04-16 LAB — URINALYSIS, ROUTINE W REFLEX MICROSCOPIC
Bilirubin Urine: NEGATIVE
Glucose, UA: NEGATIVE mg/dL
Hgb urine dipstick: NEGATIVE
Ketones, ur: NEGATIVE mg/dL
Nitrite: NEGATIVE
Protein, ur: 30 mg/dL — AB
Specific Gravity, Urine: 1.025 (ref 1.005–1.030)
pH: 6.5 (ref 5.0–8.0)

## 2021-04-16 LAB — LIPASE, BLOOD: Lipase: 33 U/L (ref 11–51)

## 2021-04-16 LAB — PREGNANCY, URINE: Preg Test, Ur: NEGATIVE

## 2021-04-16 MED ORDER — SODIUM CHLORIDE 0.9 % IV BOLUS
1000.0000 mL | Freq: Once | INTRAVENOUS | Status: AC
  Administered 2021-04-16: 1000 mL via INTRAVENOUS

## 2021-04-16 MED ORDER — PANTOPRAZOLE SODIUM 40 MG IV SOLR
40.0000 mg | Freq: Once | INTRAVENOUS | Status: AC
  Administered 2021-04-16: 40 mg via INTRAVENOUS
  Filled 2021-04-16: qty 10

## 2021-04-16 MED ORDER — PROCHLORPERAZINE EDISYLATE 10 MG/2ML IJ SOLN
10.0000 mg | Freq: Once | INTRAMUSCULAR | Status: AC
  Administered 2021-04-16: 10 mg via INTRAVENOUS
  Filled 2021-04-16: qty 2

## 2021-04-16 MED ORDER — ALUM & MAG HYDROXIDE-SIMETH 200-200-20 MG/5ML PO SUSP
30.0000 mL | Freq: Once | ORAL | Status: AC
  Administered 2021-04-16: 30 mL via ORAL
  Filled 2021-04-16 (×2): qty 30

## 2021-04-16 MED ORDER — LIDOCAINE VISCOUS HCL 2 % MT SOLN
15.0000 mL | Freq: Once | OROMUCOSAL | Status: AC
  Administered 2021-04-16: 15 mL via ORAL
  Filled 2021-04-16: qty 15

## 2021-04-16 MED ORDER — HYOSCYAMINE SULFATE 0.125 MG SL SUBL
0.2500 mg | SUBLINGUAL_TABLET | Freq: Once | SUBLINGUAL | Status: AC
  Administered 2021-04-16: 0.25 mg via SUBLINGUAL
  Filled 2021-04-16: qty 2

## 2021-04-16 NOTE — ED Triage Notes (Signed)
?  Patient comes in with epigastric pain that has been going on for the last month.  Patient has been treating at home but had an episode of hematemesis shortly before arrival.  Patient said it was not streaks and looked like a large amount of blood.  Pain 7/10, sharp/stabbing.  Gallbladder removed last year.   ?

## 2021-04-16 NOTE — ED Provider Notes (Signed)
[facility] EMERGENCY DEPT Provider Note  CSN: [id number] Arrival date & time: 04/16/21 1943  Chief Complaint(s) Abdominal Pain and Hematemesis  HPI [name] is a 36 y.o. female     Abdominal Pain Pain location:  Epigastric Pain quality: aching   Pain severity:  Moderate Onset quality:  Gradual Duration:  2 weeks Timing:  Constant Progression:  Waxing and waning Chronicity:  New Context: not diet changes, not sick contacts and not suspicious food intake   Relieved by:  Nothing Worsened by:  Nothing Ineffective treatments:  NSAIDs and acetaminophen Associated symptoms: hematemesis, nausea and vomiting   Associated symptoms: no chest pain, no diarrhea, no dysuria and no fever   Risk factors: no alcohol abuse    Past Medical History Past Medical History:  Diagnosis Date   Anemia    History of blood transfusion 2018   post CSection   Pre-diabetes    Pre-eclampsia 2018   Patient Active Problem List   Diagnosis Date Noted   Cholelithiasis with chronic cholecystitis 40/34/7425   Biliary colic 95/63/8756   Essential hypertension 08/12/2020   Obesity, Class III, BMI 40-49.9 (morbid obesity) ([place]) 08/12/2020   Nausea and vomiting 08/12/2020   ASCUS with positive high risk HPV cervical 07/13/2018   Anemia of pregnancy 06/02/2018   Mild pre-eclampsia 06/01/2018   VBAC, delivered 06/01/2018   H/O pre-eclampsia in prior pregnancy, currently pregnant 05/03/2018   Unwanted fertility 05/03/2018   History of C-section 04/24/2018   Late prenatal care affecting pregnancy in third trimester 04/19/2018   History of preterm delivery 04/19/2018   History of twin pregnancy in prior pregnancy 04/19/2018   Supervision of other normal pregnancy, antepartum 04/18/2018   Home Medication(s) Prior to Admission medications   Medication Sig Start Date End Date Taking? Authorizing Provider  famotidine (PEPCID) 20 MG tablet Take 1 tablet (20 mg total) by mouth daily.  04/17/21 05/17/21 Yes Eveny Anastas, Grayce Sessions, MD  lidocaine (XYLOCAINE) 2 % solution Use as directed 15 mLs in the mouth or throat as needed for mouth pain. 04/17/21  Yes Buelah Rennie, Grayce Sessions, MD  prochlorperazine (COMPAZINE) 25 MG suppository Place 1 suppository (25 mg total) rectally every 12 (twelve) hours as needed for nausea or vomiting. 04/17/21  Yes Wilborn Membreno, Grayce Sessions, MD  amLODipine (NORVASC) 10 MG tablet Take 10 mg by mouth daily. 07/17/20   [provider]  hydrochlorothiazide (HYDRODIURIL) 12.5 MG tablet Take 12.5 mg by mouth every morning. 07/15/20   [provider]  meloxicam (MOBIC) 7.5 MG tablet Take 1 tablet (7.5 mg total) by mouth daily. 03/17/21   Scheeler, Carola Rhine, PA-C  promethazine (PHENERGAN) 12.5 MG tablet Take 1 tablet (12.5 mg total) by mouth every 8 (eight) hours as needed for nausea or vomiting. 03/04/21   Corena Herter, PA-C  tirzepatide [facility]) 2.5 MG/0.5ML Pen Inject 2.5 mg into the skin once a week. 04/13/21   Rodriguez-Southworth, Sunday Spillers, PA-C  Allergies Ciprofloxacin and Zofran [ondansetron hcl]  Review of Systems Review of Systems  Constitutional:  Negative for fever.  Cardiovascular:  Negative for chest pain.  Gastrointestinal:  Positive for abdominal pain, hematemesis, nausea and vomiting. Negative for diarrhea.  Genitourinary:  Negative for dysuria.  As noted in HPI  Physical Exam Vital Signs  I have reviewed the triage vital signs BP (!) 156/105    Pulse 86    Temp 98.3 F (36.8 C) (Oral)    Resp 18    Ht '5\' 3"'$  (1.6 m)    Wt 131.5 kg    LMP 03/31/2021 (Exact Date)    SpO2 100%    BMI 51.37 kg/m   Physical Exam Vitals reviewed.  Constitutional:      General: She is not in acute distress.    Appearance: She is well-developed. She is not diaphoretic.  HENT:     Head: Normocephalic and atraumatic.      Right Ear: External ear normal.     Left Ear: External ear normal.     Nose: Nose normal.  Eyes:     General: No scleral icterus.    Conjunctiva/sclera: Conjunctivae normal.  Neck:     Trachea: Phonation normal.  Cardiovascular:     Rate and Rhythm: Normal rate and regular rhythm.  Pulmonary:     Effort: Pulmonary effort is normal. No respiratory distress.     Breath sounds: No stridor.  Abdominal:     General: There is no distension.     Tenderness: There is abdominal tenderness in the epigastric area. There is no guarding or rebound.  Musculoskeletal:        General: Normal range of motion.     Cervical back: Normal range of motion.  Neurological:     Mental Status: She is alert and oriented to person, place, and time.  Psychiatric:        Behavior: Behavior normal.    ED Results and Treatments Labs (all labs ordered are listed, but only abnormal results are displayed) Labs Reviewed  COMPREHENSIVE METABOLIC PANEL - Abnormal; Notable for the following components:      Result Value   Potassium 3.4 (*)    Glucose, Bld 106 (*)    Creatinine, Ser 1.03 (*)    Calcium 8.6 (*)    All other components within normal limits  CBC WITH DIFFERENTIAL/PLATELET - Abnormal; Notable for the following components:   RBC 3.56 (*)    Hemoglobin 11.1 (*)    HCT 33.2 (*)    All other components within normal limits  URINALYSIS, ROUTINE W REFLEX MICROSCOPIC - Abnormal; Notable for the following components:   APPearance HAZY (*)    Protein, ur 30 (*)    Leukocytes,Ua MODERATE (*)    All other components within normal limits  LIPASE, BLOOD  HCG, SERUM, QUALITATIVE  PREGNANCY, URINE  EKG  EKG Interpretation  Date/Time:    Ventricular Rate:    PR Interval:    QRS Duration:   QT Interval:    QTC Calculation:   R Axis:     Text Interpretation:         Radiology DG  Chest Port 1 View  Result Date: 04/16/2021 CLINICAL DATA:  Epigastric pain for 1 month, initial encounter EXAM: PORTABLE CHEST 1 VIEW COMPARISON:  07/14/2020 FINDINGS: The heart size and mediastinal contours are within normal limits. Both lungs are clear. The visualized skeletal structures are unremarkable. IMPRESSION: No active disease. Electronically Signed   By: Inez Catalina M.D.   On: 04/16/2021 23:24    Pertinent labs & imaging results that were available during my care of the patient were reviewed by me and considered in my medical decision making (see MDM for details).  Medications Ordered in ED Medications  alum & mag hydroxide-simeth (MAALOX/MYLANTA) 200-200-20 MG/5ML suspension 30 mL (30 mLs Oral Given 04/16/21 2336)    And  lidocaine (XYLOCAINE) 2 % viscous mouth solution 15 mL (15 mLs Oral Given 04/16/21 2336)  hyoscyamine (LEVSIN SL) SL tablet 0.25 mg (0.25 mg Sublingual Given 04/16/21 2331)  prochlorperazine (COMPAZINE) injection 10 mg (10 mg Intravenous Given 04/16/21 2342)  sodium chloride 0.9 % bolus 1,000 mL (1,000 mLs Intravenous New Bag/Given 04/16/21 2331)  pantoprazole (PROTONIX) injection 40 mg (40 mg Intravenous Given 04/16/21 2334)                                                                                                                                     Procedures Procedures  (including critical care time)  Medical Decision Making / ED Course    Complexity of Problem:  Co-morbidities/SDOH that complicate the patient evaluation/care: Obesity  Additional history obtained: None  Patient's presenting problem/concern and DDX listed below: Epigastric pain with hematemesis Pancreatitis, gastritis, Mallory-Weiss tear, peptic ulcer, esophageal perforation     Complexity of Data:   Cardiac Monitoring: None  Laboratory Tests ordered listed below with my independent interpretation: CBC without leukocytosis.  No significant anemia. No significant electrolyte  derangements or renal sufficiency No evidence of bili obstruction or pancreatitis   Imaging Studies ordered listed below with my independent interpretation: Chest x-ray negative for pneumothorax.     ED Course:    Hospitalization Considered:  Yes  Assessment, Intervention, and Reassessment: Epigastric pain Ruled out for biliary disease, pancreatitis and esophageal perforation. Likely related to gastritis with Mallory-Weiss tear versus peptic ulcer disease Patient denied any melenic stools concerning for bleeding ulcer Near complete resolution of pain following GI cocktail and Protonix. Tolerating oral intake.    Final Clinical Impression(s) / ED Diagnoses Final diagnoses:  Hematemesis  Epigastric pain   The patient appears reasonably screened and/or stabilized for discharge and I doubt any other medical condition or other [facility] requiring further screening, evaluation, or treatment in the ED at this time  prior to discharge. Safe for discharge with strict return precautions.  Disposition: Discharge  Condition: Good  I have discussed the results, Dx and Tx plan with the patient/family who expressed understanding and agree(s) with the plan. Discharge instructions discussed at length. The patient/family was given strict return precautions who verbalized understanding of the instructions. No further questions at time of discharge.    ED Discharge Orders          Ordered    famotidine (PEPCID) 20 MG tablet  Daily        04/17/21 0048    lidocaine (XYLOCAINE) 2 % solution  As needed        04/17/21 0048    prochlorperazine (COMPAZINE) 25 MG suppository  Every 12 hours PRN        04/17/21 0048              Follow Up: Loyola Mast, PA-C [address] [place] [address] [phone]  Call  to schedule an appointment for close follow up           This chart was dictated using voice recognition software.  Despite best efforts to  proofread,  errors can occur which can change the documentation meaning.    Fatima Blank, MD 04/17/21 [phone]

## 2021-04-17 MED ORDER — LIDOCAINE VISCOUS HCL 2 % MT SOLN: 15.0000 mL | OROMUCOSAL | 0 refills | Status: DC | PRN

## 2021-04-17 MED ORDER — PROCHLORPERAZINE 25 MG RE SUPP: 25.0000 mg | Freq: Two times a day (BID) | RECTAL | 0 refills | Status: DC | PRN

## 2021-04-17 MED ORDER — FAMOTIDINE 20 MG PO TABS: 20.0000 mg | ORAL_TABLET | Freq: Every day | ORAL | 0 refills | Status: AC

## 2021-04-23 ENCOUNTER — Encounter: Payer: Self-pay | Admitting: Plastic Surgery

## 2021-04-23 ENCOUNTER — Ambulatory Visit: Payer: Medicaid Other | Admitting: Plastic Surgery

## 2021-04-23 ENCOUNTER — Other Ambulatory Visit: Payer: Self-pay

## 2021-04-23 VITALS — BP 142/98 | HR 89 | Ht 65.0 in | Wt 292.4 lb

## 2021-04-23 DIAGNOSIS — M793 Panniculitis, unspecified: Secondary | ICD-10-CM

## 2021-04-23 NOTE — Progress Notes (Signed)
? ?Referring Provider ?Loyola Mast, PA-C ?[address] ?[place],   [id number]  ? ?CC:  ?Chief Complaint  ?Patient presents with  ? Consult  ?   ? ?Jennifer Booth is an 36 y.o. female.  ?HPI: Patient presents about 2 months out from removal of a lipoma from her back and a skin lesion from her buttock.  She feels like she is healed well from that surgery.  She is interested in discussing panniculectomy.  She has lost quite a bit of weight and her BMI is now down to 48.  She would like to pursue panniculectomy if possible.  She has significant overhanging skin and is bothered by chronic rashes in the crease that have been refractory to over-the-counter treatments.  Prior abdominal surgeries include C-section, cholecystectomy and tubal ligation.  She does not smoke and is not diabetic. ? ?Allergies  ?Allergen Reactions  ? Ciprofloxacin Anaphylaxis  ? Zofran [Ondansetron Hcl] Anaphylaxis  ? ? ?Outpatient Encounter Medications as of 04/23/2021  ?Medication Sig  ? amLODipine (NORVASC) 10 MG tablet Take 10 mg by mouth daily.  ? famotidine (PEPCID) 20 MG tablet Take 1 tablet (20 mg total) by mouth daily.  ? hydrochlorothiazide (HYDRODIURIL) 12.5 MG tablet Take 12.5 mg by mouth every morning.  ? tirzepatide [facility]) 2.5 MG/0.5ML Pen Inject 2.5 mg into the skin once a week.  ? [DISCONTINUED] lidocaine (XYLOCAINE) 2 % solution Use as directed 15 mLs in the mouth or throat as needed for mouth pain.  ? [DISCONTINUED] meloxicam (MOBIC) 7.5 MG tablet Take 1 tablet (7.5 mg total) by mouth daily.  ? [DISCONTINUED] prochlorperazine (COMPAZINE) 25 MG suppository Place 1 suppository (25 mg total) rectally every 12 (twelve) hours as needed for nausea or vomiting.  ? [DISCONTINUED] promethazine (PHENERGAN) 12.5 MG tablet Take 1 tablet (12.5 mg total) by mouth every 8 (eight) hours as needed for nausea or vomiting.  ? ?No facility-administered encounter medications on file as of 04/23/2021.  ?  ? ?Past  Medical History:  ?Diagnosis Date  ? Anemia   ? History of blood transfusion 2018  ? post CSection  ? Pre-diabetes   ? Pre-eclampsia 2018  ? ? ?Past Surgical History:  ?Procedure Laterality Date  ? CESAREAN SECTION    ? CHOLECYSTECTOMY N/A 08/13/2020  ? Procedure: LAPAROSCOPIC CHOLECYSTECTOMY WITH INTRAOPERATIVE CHOLANGIOGRAM;  Surgeon: Armandina Gemma, MD;  [facility]: [facility];  Service: General;  Laterality: N/A;  ? LAPAROSCOPIC TUBAL LIGATION Bilateral 08/02/2018  ? Procedure: LAPAROSCOPIC TUBAL LIGATION WITH FILSHIE CLIPS;  Surgeon: Sloan Leiter, MD;  Location: [place];  Service: Gynecology;  Laterality: Bilateral;  ? MASS EXCISION Left 03/10/2021  ? Procedure: EXCISION LEFT UPPER BACK MASS;  Surgeon: [name], MD;  Location: [place];  Service: Plastics;  Laterality: Left;  1 HOUR  ? NEVUS EXCISION Right 03/10/2021  ? Procedure: EXCISION RIGHT BUTTOCK SKIN LESION;  Surgeon: [name], MD;  Location: [place];  Service: Plastics;  Laterality: Right;  ? ? ?Family History  ?Problem Relation Age of Onset  ? Hypertension Mother   ? Diabetes Mother   ? Hypercholesterolemia Father   ? Hypertension Father   ? Heart disease Maternal Grandmother   ? Diabetes Maternal Grandmother   ? ? ?Social History  ? ?Social History Narrative  ? Not on file  ?  ? ?Review of Systems ?General: Denies fevers, chills, weight loss ?CV: Denies chest pain, shortness of breath, palpitations ? ?Physical Exam ?Vitals with BMI 04/23/2021  04/17/2021 04/17/2021  ?Height '5\' 5"'$  - -  ?Weight 292 lbs 6 oz - -  ?BMI 48.66 - -  ?Systolic 712 458 099  ?Diastolic 98 833 825  ?Pulse 89 71 86  ?  ?General:  No acute distress,  Alert and oriented, Non-Toxic, Normal speech and affect ?Abdomen: Abdomen is soft nontender.  Well-healed scars from previous operations.  No obvious hernias.  She has an overhanging pannus with signs of skin irritation in the crease.  Significant skin and adipose tissue in the infra and supraumbilical  areas. ? ?Assessment/Plan ?Patient is a reasonable candidate for infraumbilical panniculectomy.  I discussed the limitations of this procedure and the fact that it would not address the upper abdominal contour.  Her BMI is something that we discussed and it does contribute to a higher likelihood of wound healing complications.  I do think since it is come down it would be acceptable to do an infraumbilical panniculectomy and she is not quite ready to do the full abdominoplasty anyhow.  She does expect to lose more weight and I did caution her that further weight loss might cause a recurrence of the skin redundancy.  She is hopeful to do a full abdominoplasty down the line if she gets her BMI down into the 30s.  She is particularly bothered by the rashes and skin irritation that she currently has and wants to move ahead with infraumbilical pig panniculectomy if possible.  I discussed risks that include bleeding, infection, damage to surrounding structures need for additional procedures.  I discussed the location and orientation of the scars and need for postoperative drains.  She is interested in moving forward and all her questions were answered. ? ?[name] ?04/23/2021, 3:54 PM  ? ? ?  ?

## 2021-05-27 DIAGNOSIS — Z23 Encounter for immunization: Secondary | ICD-10-CM | POA: Diagnosis not present

## 2021-05-27 DIAGNOSIS — Z1159 Encounter for screening for other viral diseases: Secondary | ICD-10-CM | POA: Diagnosis not present

## 2021-05-27 DIAGNOSIS — Z6841 Body Mass Index (BMI) 40.0 and over, adult: Secondary | ICD-10-CM | POA: Diagnosis not present

## 2021-05-27 DIAGNOSIS — Z0000 Encounter for general adult medical examination without abnormal findings: Secondary | ICD-10-CM | POA: Diagnosis not present

## 2021-06-04 ENCOUNTER — Telehealth: Payer: Self-pay | Admitting: Plastic Surgery

## 2021-06-04 NOTE — Telephone Encounter (Signed)
Patient called to check on status of prior auth for surgery. Please advise at [phone].  ?

## 2021-06-09 ENCOUNTER — Telehealth: Payer: Self-pay | Admitting: Plastic Surgery

## 2021-06-09 NOTE — Telephone Encounter (Signed)
Called patient to advise that her authorization had been uploaded to [facility] portal but they could not locate it when I called to follow up. Started new authorization with rep today and will fax over clinicals. Patient is interested in possibly having upper and side liposuction done. Advd once we hear from insurance we can discuss adding that on there as well and what the quote would be.  ?

## 2021-06-10 ENCOUNTER — Telehealth: Payer: Self-pay | Admitting: Plastic Surgery

## 2021-06-10 NOTE — Telephone Encounter (Signed)
Insurance faxed form requesting additional information related to authorization. Called patient to obtain information. Form faxed back to insurance company ? ?How long has OTC products been used for rashes? Patient reports 4-5 months- self treated ? ?How much weight has patient lost? Patient reported 45 lbs  ? ?Has weight been stable for 3 months? Pt reports she was seeking to lose weight  ? ?Is weight loss being supervised by MD?: Pt reports no ?

## 2021-06-25 ENCOUNTER — Telehealth: Payer: Self-pay | Admitting: Plastic Surgery

## 2021-06-25 NOTE — Telephone Encounter (Signed)
Called patient to advise of insurance authorization denial. Advised that her insurance denied it because no documentation of stable weight for 3 months, BMI >30 but no documentation of weight loss program monitored by medical professional. Offered to refer patient to healthy weight and wellness to work on getting BMI <30 and it will also document weight over the course of that treatment. Patient is aware she should call upon completion.

## 2021-07-23 ENCOUNTER — Ambulatory Visit (HOSPITAL_COMMUNITY)
Admission: RE | Admit: 2021-07-23 | Discharge: 2021-07-23 | Disposition: A | Discharge status: Home / Self Care | Payer: Medicaid Other | Source: Ambulatory Visit | Attending: Internal Medicine | Admitting: Internal Medicine

## 2021-07-23 ENCOUNTER — Encounter (HOSPITAL_COMMUNITY): Payer: Self-pay

## 2021-07-23 VITALS — BP 173/116 | HR 85 | Temp 98.2°F | Resp 18

## 2021-07-23 DIAGNOSIS — N760 Acute vaginitis: Secondary | ICD-10-CM | POA: Insufficient documentation

## 2021-07-23 DIAGNOSIS — I10 Essential (primary) hypertension: Secondary | ICD-10-CM | POA: Diagnosis not present

## 2021-07-23 DIAGNOSIS — Z113 Encounter for screening for infections with a predominantly sexual mode of transmission: Secondary | ICD-10-CM | POA: Diagnosis not present

## 2021-07-23 DIAGNOSIS — B9689 Other specified bacterial agents as the cause of diseases classified elsewhere: Secondary | ICD-10-CM | POA: Diagnosis not present

## 2021-07-23 DIAGNOSIS — N898 Other specified noninflammatory disorders of vagina: Secondary | ICD-10-CM

## 2021-07-23 LAB — HIV ANTIBODY (ROUTINE TESTING W REFLEX): HIV Screen 4th Generation wRfx: NONREACTIVE

## 2021-07-23 MED ORDER — METRONIDAZOLE 500 MG PO TABS: 500.0000 mg | ORAL_TABLET | Freq: Two times a day (BID) | ORAL | 0 refills | Status: DC

## 2021-07-23 NOTE — ED Provider Notes (Signed)
[facility]    CSN: [id number] Arrival date & time: 07/23/21  0934      History   Chief Complaint Chief Complaint  Patient presents with   Vaginal Itching    Vaginal dryness and itching - Entered by patient    HPI Jennifer Booth is a 36 y.o. female.   Patient presents urgent care for evaluation of her thin and milky vaginal discharge for the last week.  She is also complaining of vaginal itching to her suprapubic area, but states that "the inside does not itch".  She states her symptoms feel the same as when she had bacterial vaginosis 1 year ago.  She denies recent new sexual partners and would like to be screened for STIs including HIV and RPR testing.  She states that she gets screened every year just to make sure she does not have any STIs.  She is without any other complaint today.  Denies nausea, vomiting, diarrhea, urinary symptoms, dizziness, shortness of breath, chest pain, and headache.  No fever/chills reported.  No abdominal pain or low back pain.  Blood pressure is noted to be elevated in clinic today at 173/116.  Patient states that she takes amlodipine and hydrochlorothiazide prescribed by her primary care provider and started taking this medication in January 2023.  She is complaining of vaginal dryness and mouth dryness since starting the medications in January.  She has not taken her blood pressure medicine this morning and states that she will do so soon as she gets home from the clinic.  She is asymptomatic and without headache, blurry vision, and decreased visual acuity.   Vaginal Itching    Past Medical History:  Diagnosis Date   Anemia    History of blood transfusion 2018   post CSection   Pre-diabetes    Pre-eclampsia 2018    Patient Active Problem List   Diagnosis Date Noted   Cholelithiasis with chronic cholecystitis 30/08/6224   Biliary colic 33/35/4562   Essential hypertension 08/12/2020   Obesity, Class III, BMI 40-49.9 (morbid  obesity) ([place]) 08/12/2020   Nausea and vomiting 08/12/2020   ASCUS with positive high risk HPV cervical 07/13/2018   Anemia of pregnancy 06/02/2018   Mild pre-eclampsia 06/01/2018   VBAC, delivered 06/01/2018   H/O pre-eclampsia in prior pregnancy, currently pregnant 05/03/2018   Unwanted fertility 05/03/2018   History of C-section 04/24/2018   Late prenatal care affecting pregnancy in third trimester 04/19/2018   History of preterm delivery 04/19/2018   History of twin pregnancy in prior pregnancy 04/19/2018   Supervision of other normal pregnancy, antepartum 04/18/2018    Past Surgical History:  Procedure Laterality Date   CESAREAN SECTION     CHOLECYSTECTOMY N/A 08/13/2020   Procedure: LAPAROSCOPIC CHOLECYSTECTOMY WITH INTRAOPERATIVE CHOLANGIOGRAM;  Surgeon: Armandina Gemma, MD;  [facility];  Service: General;  Laterality: N/A;   LAPAROSCOPIC TUBAL LIGATION Bilateral 08/02/2018   Procedure: LAPAROSCOPIC TUBAL LIGATION WITH FILSHIE CLIPS;  Surgeon: Sloan Leiter, MD;  Location: [place];  Service: Gynecology;  Laterality: Bilateral;   MASS EXCISION Left 03/10/2021   Procedure: EXCISION LEFT UPPER BACK MASS;  Surgeon: [name], MD;  Location: [place];  Service: Plastics;  Laterality: Left;  1 HOUR   NEVUS EXCISION Right 03/10/2021   Procedure: EXCISION RIGHT BUTTOCK SKIN LESION;  Surgeon: [name], MD;  Location: [place];  Service: Plastics;  Laterality: Right;    OB History     Gravida  4  Para  4   Term  3   Preterm  1   AB  0   Living  5      SAB  0   IAB  0   Ectopic  0   Multiple  1   Live Births  5            Home Medications    Prior to Admission medications   Medication Sig Start Date End Date Taking? Authorizing Provider  metroNIDAZOLE (FLAGYL) 500 MG tablet Take 1 tablet (500 mg total) by mouth 2 (two) times daily. 07/23/21  Yes Talbot Grumbling, FNP  amLODipine (NORVASC) 10 MG tablet Take 10 mg by mouth daily.  07/17/20   [provider]  famotidine (PEPCID) 20 MG tablet Take 1 tablet (20 mg total) by mouth daily. 04/17/21 05/17/21  Fatima Blank, MD  hydrochlorothiazide (HYDRODIURIL) 12.5 MG tablet Take 12.5 mg by mouth every morning. 07/15/20   [provider]  tirzepatide [facility]) 2.5 MG/0.5ML Pen Inject 2.5 mg into the skin once a week. 04/13/21   Rodriguez-South[place], Sunday Spillers, PA-C    Family History Family History  Problem Relation Age of Onset   Hypertension Mother    Diabetes Mother    Hypercholesterolemia Father    Hypertension Father    Heart disease Maternal Grandmother    Diabetes Maternal Grandmother     Social History Social History   Tobacco Use   Smoking status: Never   Smokeless tobacco: Never  Vaping Use   Vaping Use: Never used  Substance Use Topics   Alcohol use: Not Currently   Drug use: Never     Allergies   Ciprofloxacin and Zofran [ondansetron hcl]   Review of Systems Review of Systems Per HPI  Physical Exam Triage Vital Signs ED Triage Vitals  Enc Vitals Group     BP 07/23/21 1044 (!) 173/116     Pulse Rate 07/23/21 1044 85     Resp 07/23/21 1044 18     Temp 07/23/21 1044 98.2 F (36.8 C)     Temp src --      SpO2 07/23/21 1045 98 %     Weight --      Height --      Head Circumference --      Peak Flow --      Pain Score 07/23/21 1043 0     Pain Loc --      Pain Edu? --      Excl. in [place]? --    No data found.  Updated Vital Signs BP (!) 173/116   Pulse 85   Temp 98.2 F (36.8 C)   Resp 18   LMP 06/26/2021   SpO2 98%   Breastfeeding No   Visual Acuity Right Eye Distance:   Left Eye Distance:   Bilateral Distance:    Right Eye Near:   Left Eye Near:    Bilateral Near:     Physical Exam Vitals and nursing note reviewed.  Constitutional:      General: She is not in acute distress.    Appearance: Normal appearance. She is well-developed. She is not ill-appearing.  HENT:     Head: Normocephalic and  atraumatic.     Right Ear: External ear normal.     Left Ear: External ear normal.     Nose: Nose normal.     Mouth/Throat:     Mouth: Mucous membranes are moist.  Eyes:     Extraocular Movements:  Extraocular movements intact.     Conjunctiva/sclera: Conjunctivae normal.  Cardiovascular:     Rate and Rhythm: Normal rate and regular rhythm.     Heart sounds: Normal heart sounds. No murmur heard. Pulmonary:     Effort: Pulmonary effort is normal. No respiratory distress.     Breath sounds: Normal breath sounds.  Abdominal:     General: Bowel sounds are normal.     Palpations: Abdomen is soft.     Tenderness: There is no abdominal tenderness.  Musculoskeletal:        General: No swelling.     Cervical back: Neck supple.  Skin:    General: Skin is warm and dry.     Capillary Refill: Capillary refill takes less than 2 seconds.  Neurological:     General: No focal deficit present.     Mental Status: She is alert and oriented to person, place, and time. Mental status is at baseline.  Psychiatric:        Mood and Affect: Mood normal.        Behavior: Behavior normal.        Thought Content: Thought content normal.        Judgment: Judgment normal.      UC Treatments / Results  Labs (all labs ordered are listed, but only abnormal results are displayed) Labs Reviewed  HIV ANTIBODY (ROUTINE TESTING W REFLEX)  RPR  CERVICOVAGINAL ANCILLARY ONLY    EKG   Radiology No results found.  Procedures Procedures (including critical care time)  Medications Ordered in UC Medications - No data to display  Initial Impression / Assessment and Plan / UC Course  I have reviewed the triage vital signs and the nursing notes.  Pertinent labs & imaging results that were available during my care of the patient were reviewed by me and considered in my medical decision making (see chart for details).  1.  Bacterial vaginosis Patient to take metronidazole twice daily for the next 7 days.   Patient advised not to drink alcohol while taking this medication as it can make her very sick.  We will confirm BV with STI testing.  Plan to go ahead and treat due to past history of bacterial vaginosis with similar classic symptoms.   2.  Encounter for screening for STIs STI labs pending.  We will call patient if any of her results are positive or change in treatment plan in the next 2 to 3 days.  She will not receive a phone call from [place] if results are negative.   3.  Hypertension Patient to take amlodipine and hydrochlorothiazide when she goes home as she has not taken her blood pressure medications today.  Patient instructed to follow-up with her primary care provider regarding symptoms of dry mouth and vaginal dryness as they could be related to her blood pressure medications and started in January 2023 at the same time that she began her antihypertensive treatment.  Encourage patient to drink at least 64 ounces of water per day and maintain a low-salt diet to improve blood pressure.  She is to seek medical care if she suddenly experiences severe headache, blurry vision, dizziness, or decreased visual acuity.   Counseled patient regarding appropriate use of medications and potential side effects for all medications recommended or prescribed today. Discussed red flag signs and symptoms of worsening condition,when to call the PCP office, return to urgent care, and when to seek higher level of care. Patient verbalizes understanding and agreement with plan.  All questions answered. Patient discharged in stable condition.  Final Clinical Impressions(s) / UC Diagnoses   Final diagnoses:  Encounter for screening examination for sexually transmitted disease  Bacterial vaginosis  Vaginal discharge     Discharge Instructions      Your STI testing will come back in the next couple of days.  Take Flagyl antibiotic twice daily for the next 7 days for bacterial vaginosis.  Do not drink alcohol while  taking this medication as it can make you very sick.   Take your blood pressure medication as soon as you get home and check your blood pressure regularly.  Schedule an appointment with your primary care provider to discuss your vaginal dryness and dry mouth that could be associated with your blood pressure medications.  Increase your water intake to at least 64 ounces water per day and attempt to eat a low-salt diet to improve your blood pressure.  If you develop any new or worsening symptoms or do not improve in the next 2 to 3 days, please return.  If your symptoms are severe, please go to the emergency room.  Follow-up with your primary care provider for further evaluation and management of your symptoms as well as ongoing wellness visits.  I hope you feel better!     ED Prescriptions     Medication Sig Dispense Auth. Provider   metroNIDAZOLE (FLAGYL) 500 MG tablet Take 1 tablet (500 mg total) by mouth 2 (two) times daily. 14 tablet Talbot Grumbling, FNP      PDMP not reviewed this encounter.   Talbot Grumbling, [place] 07/23/21 1221

## 2021-07-23 NOTE — Discharge Instructions (Addendum)
Your STI testing will come back in the next couple of days.  Take Flagyl antibiotic twice daily for the next 7 days for bacterial vaginosis.  Do not drink alcohol while taking this medication as it can make you very sick.   Take your blood pressure medication as soon as you get home and check your blood pressure regularly.  Schedule an appointment with your primary care provider to discuss your vaginal dryness and dry mouth that could be associated with your blood pressure medications.  Increase your water intake to at least 64 ounces water per day and attempt to eat a low-salt diet to improve your blood pressure.  If you develop any new or worsening symptoms or do not improve in the next 2 to 3 days, please return.  If your symptoms are severe, please go to the emergency room.  Follow-up with your primary care provider for further evaluation and management of your symptoms as well as ongoing wellness visits.  I hope you feel better!

## 2021-07-23 NOTE — ED Triage Notes (Signed)
Pt is present today with concerns vaginal irritation and vaginal dryness. Pt vaginal irritation started one week ago. Pt states vaginal dryness started one month ago

## 2021-07-24 ENCOUNTER — Telehealth (HOSPITAL_COMMUNITY): Payer: Self-pay | Admitting: Emergency Medicine

## 2021-07-24 LAB — CERVICOVAGINAL ANCILLARY ONLY
Bacterial Vaginitis (gardnerella): NEGATIVE
Candida Glabrata: NEGATIVE
Candida Vaginitis: POSITIVE — AB
Chlamydia: NEGATIVE
Comment: NEGATIVE
Comment: NEGATIVE
Comment: NEGATIVE
Comment: NEGATIVE
Comment: NEGATIVE
Comment: NORMAL
Neisseria Gonorrhea: NEGATIVE
Trichomonas: NEGATIVE

## 2021-07-24 LAB — RPR: RPR Ser Ql: NONREACTIVE

## 2021-07-24 MED ORDER — FLUCONAZOLE 150 MG PO TABS: 150.0000 mg | ORAL_TABLET | Freq: Once | ORAL | 0 refills | Status: AC

## 2021-09-04 DIAGNOSIS — N898 Other specified noninflammatory disorders of vagina: Secondary | ICD-10-CM | POA: Diagnosis not present

## 2021-10-13 ENCOUNTER — Ambulatory Visit: Payer: Medicaid Other | Admitting: Obstetrics and Gynecology

## 2021-11-11 ENCOUNTER — Encounter (INDEPENDENT_AMBULATORY_CARE_PROVIDER_SITE_OTHER): Payer: Medicaid Other | Admitting: Family Medicine

## 2021-11-16 ENCOUNTER — Ambulatory Visit: Payer: Medicaid Other | Admitting: Obstetrics and Gynecology

## 2022-04-27 DIAGNOSIS — B354 Tinea corporis: Secondary | ICD-10-CM | POA: Diagnosis not present

## 2022-05-19 ENCOUNTER — Institutional Professional Consult (permissible substitution): Payer: Medicaid Other | Admitting: Plastic Surgery

## 2022-06-30 ENCOUNTER — Encounter: Payer: Self-pay | Admitting: Plastic Surgery

## 2022-06-30 ENCOUNTER — Institutional Professional Consult (permissible substitution): Payer: Medicaid Other | Admitting: Plastic Surgery

## 2022-07-02 ENCOUNTER — Encounter: Payer: Self-pay | Admitting: Plastic Surgery

## 2022-08-04 ENCOUNTER — Institutional Professional Consult (permissible substitution): Payer: Medicaid Other | Admitting: Plastic Surgery

## 2022-08-11 DIAGNOSIS — I10 Essential (primary) hypertension: Secondary | ICD-10-CM | POA: Diagnosis not present

## 2022-08-11 DIAGNOSIS — R079 Chest pain, unspecified: Secondary | ICD-10-CM | POA: Diagnosis not present

## 2022-08-11 DIAGNOSIS — R072 Precordial pain: Secondary | ICD-10-CM | POA: Diagnosis not present

## 2022-08-11 DIAGNOSIS — R0789 Other chest pain: Secondary | ICD-10-CM | POA: Diagnosis not present

## 2022-08-11 DIAGNOSIS — R519 Headache, unspecified: Secondary | ICD-10-CM | POA: Diagnosis not present

## 2022-08-11 DIAGNOSIS — R0602 Shortness of breath: Secondary | ICD-10-CM | POA: Diagnosis not present

## 2022-08-11 DIAGNOSIS — I517 Cardiomegaly: Secondary | ICD-10-CM | POA: Diagnosis not present

## 2022-08-11 DIAGNOSIS — R9431 Abnormal electrocardiogram [ECG] [EKG]: Secondary | ICD-10-CM | POA: Diagnosis not present

## 2022-08-12 DIAGNOSIS — I517 Cardiomegaly: Secondary | ICD-10-CM | POA: Diagnosis not present

## 2022-08-12 DIAGNOSIS — R079 Chest pain, unspecified: Secondary | ICD-10-CM | POA: Diagnosis not present

## 2022-08-23 ENCOUNTER — Ambulatory Visit: Payer: Medicaid Other

## 2022-09-04 IMAGING — DX DG CHEST 2V
2 series · 2 of 2 positions shown · non-contrast
Comparison: Chest radiograph dated 11/06/2019.

CLINICAL DATA: 35-year-old female with chest pain.

EXAM:
CHEST - 2 VIEW

[chest pa]
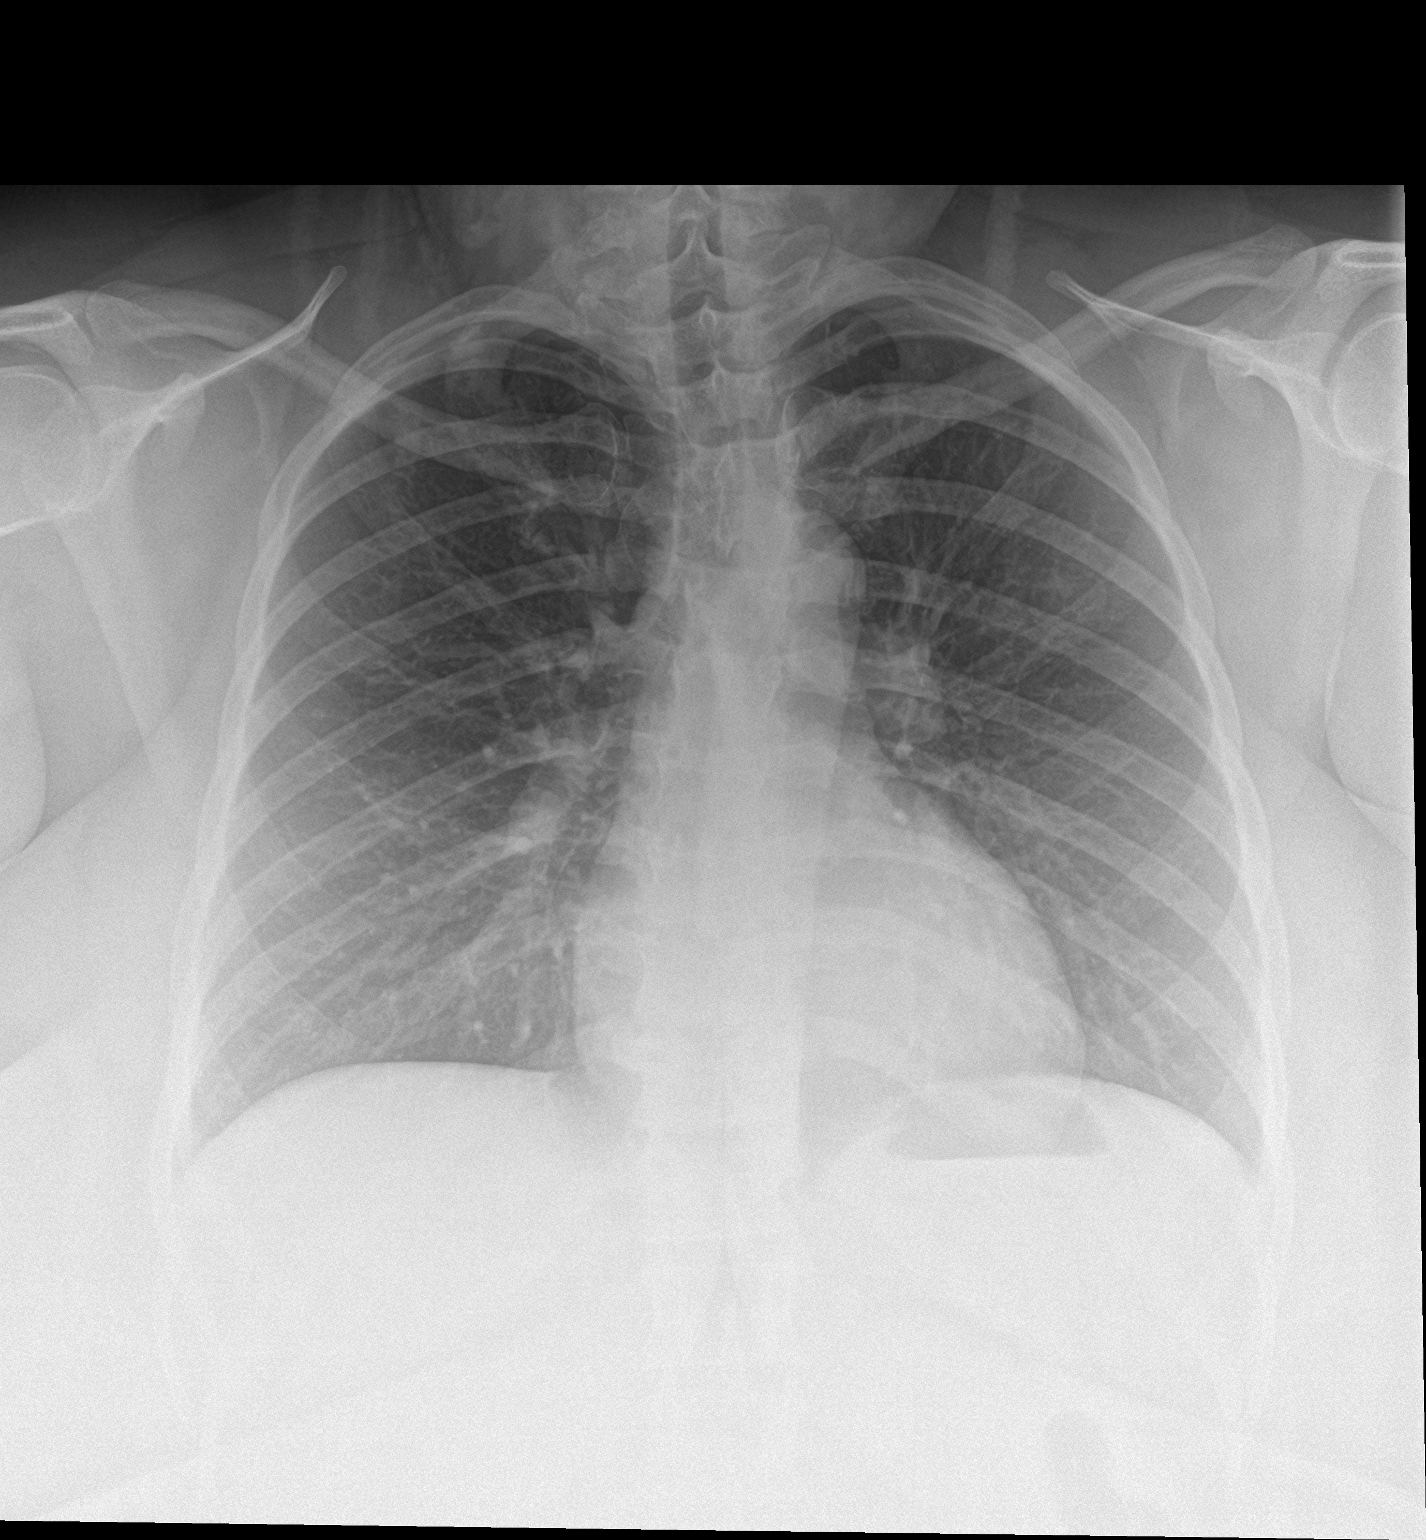

[chest lat]
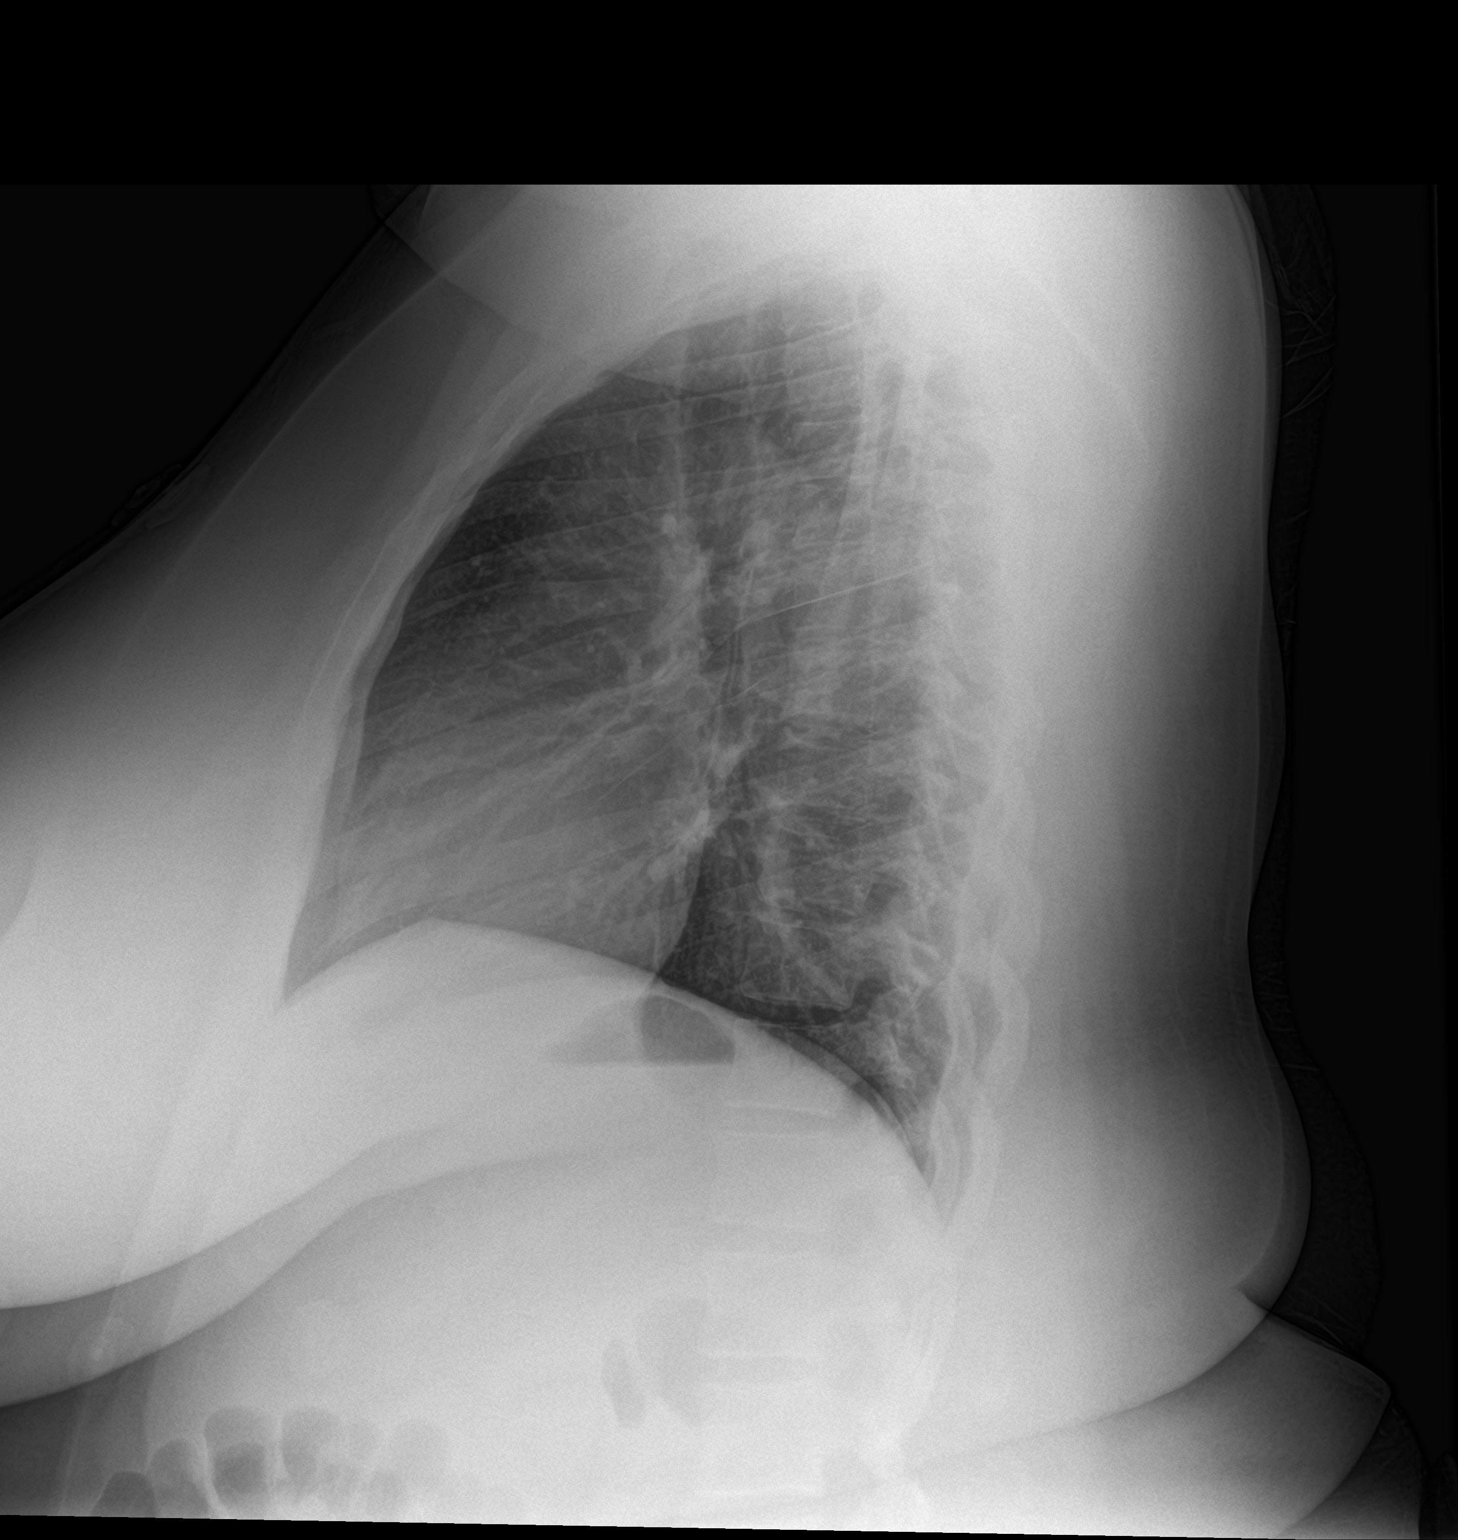

[2 of 2 positions shown; findings below may reference images not displayed]

FINDINGS: The heart size and mediastinal contours are within normal limits.
Both lungs are clear. The visualized skeletal structures are
unremarkable.
IMPRESSION: No active cardiopulmonary disease.

## 2022-10-26 DIAGNOSIS — I10 Essential (primary) hypertension: Secondary | ICD-10-CM | POA: Diagnosis not present

## 2022-10-26 DIAGNOSIS — Z6841 Body Mass Index (BMI) 40.0 and over, adult: Secondary | ICD-10-CM | POA: Diagnosis not present

## 2022-11-09 DIAGNOSIS — I10 Essential (primary) hypertension: Secondary | ICD-10-CM | POA: Diagnosis not present

## 2022-11-09 DIAGNOSIS — Z6841 Body Mass Index (BMI) 40.0 and over, adult: Secondary | ICD-10-CM | POA: Diagnosis not present

## 2022-12-07 DIAGNOSIS — H109 Unspecified conjunctivitis: Secondary | ICD-10-CM | POA: Diagnosis not present

## 2023-02-03 ENCOUNTER — Encounter: Payer: Self-pay | Admitting: Plastic Surgery

## 2023-02-03 ENCOUNTER — Ambulatory Visit: Payer: Medicaid Other | Admitting: Plastic Surgery

## 2023-02-03 VITALS — BP 158/97 | HR 88 | Ht 65.0 in | Wt 286.0 lb

## 2023-02-03 DIAGNOSIS — N62 Hypertrophy of breast: Secondary | ICD-10-CM

## 2023-02-03 DIAGNOSIS — Z6841 Body Mass Index (BMI) 40.0 and over, adult: Secondary | ICD-10-CM

## 2023-02-03 DIAGNOSIS — M793 Panniculitis, unspecified: Secondary | ICD-10-CM | POA: Diagnosis not present

## 2023-02-03 NOTE — Progress Notes (Signed)
Referring Provider Bryon Lions, PA-C [address] [place],  [place] [address]   CC:  Chief Complaint  Patient presents with   Advice Only      Jennifer Booth is an 37 y.o. female.  HPI: Jennifer Booth is a very pleasant 37 year old female who presents today for consultation for a bilateral breast reduction and for a panniculectomy.  Patient states that she has lost 65 pounds using semaglutide as well as diet and exercise.  She notes that she is having excess sweating now on the posterior aspect of her pannus which is causing rashes.  She also notes that the large size of her breast causes pain in her upper back and neck and make it difficult for her to sleep.  She would like a bilateral breast reduction and panniculectomy.  Allergies  Allergen Reactions   Ciprofloxacin Anaphylaxis   Zofran [Ondansetron Hcl] Anaphylaxis    Outpatient Encounter Medications as of 02/03/2023  Medication Sig   amLODipine (NORVASC) 10 MG tablet Take 10 mg by mouth daily.   famotidine (PEPCID) 20 MG tablet Take 1 tablet (20 mg total) by mouth daily.   tirzepatide [facility]) 2.5 MG/0.5ML Pen Inject 2.5 mg into the skin once a week. (Patient not taking: Reported on 02/03/2023)   [DISCONTINUED] hydrochlorothiazide (HYDRODIURIL) 12.5 MG tablet Take 12.5 mg by mouth every morning. (Patient not taking: Reported on 02/03/2023)   [DISCONTINUED] metroNIDAZOLE (FLAGYL) 500 MG tablet Take 1 tablet (500 mg total) by mouth 2 (two) times daily. (Patient not taking: Reported on 02/03/2023)   No facility-administered encounter medications on file as of 02/03/2023.     Past Medical History:  Diagnosis Date   Anemia    History of blood transfusion 2018   post CSection   Pre-diabetes    Pre-eclampsia 2018    Past Surgical History:  Procedure Laterality Date   CESAREAN SECTION     CHOLECYSTECTOMY N/A 08/13/2020   Procedure: LAPAROSCOPIC CHOLECYSTECTOMY WITH INTRAOPERATIVE CHOLANGIOGRAM;   Surgeon: Darnell Level, MD;  [facility];  Service: General;  Laterality: N/A;   LAPAROSCOPIC TUBAL LIGATION Bilateral 08/02/2018   Procedure: LAPAROSCOPIC TUBAL LIGATION WITH FILSHIE CLIPS;  Surgeon: Conan Bowens, MD;  Location: [facility];  Service: Gynecology;  Laterality: Bilateral;   MASS EXCISION Left 03/10/2021   Procedure: EXCISION LEFT UPPER BACK MASS;  Surgeon: Allena Napoleon, MD;  [facility];  Service: Plastics;  Laterality: Left;  1 HOUR   NEVUS EXCISION Right 03/10/2021   Procedure: EXCISION RIGHT BUTTOCK SKIN LESION;  Surgeon: Allena Napoleon, MD;  Location[facility];  Service: Plastics;  Laterality: Right;    Family History  Problem Relation Age of Onset   Hypertension Mother    Diabetes Mother    Hypercholesterolemia Father    Hypertension Father    Heart disease Maternal Grandmother    Diabetes Maternal Grandmother     Social History   Social History Narrative   Not on file     Review of Systems General: Denies fevers, chills, weight loss CV: Denies chest pain, shortness of breath, palpitations Breast: Large breasts contributing to upper back and neck pain as well as difficulty sleeping at night on her back. Abdomen: Excess skin and fat on the anterior abdominal wall causing rashes on the posterior aspect of the pannus.  Physical Exam    02/03/2023    2:47 PM 07/23/2021   10:44 AM 04/23/2021    2:30 PM  Vitals with BMI  Height 5\' 5"   5\' 5"   Weight 286 lbs  292 lbs 6 oz  BMI 47.59  48.66  Systolic 158 173 161  Diastolic 97 116 98  Pulse 88 85 89    General:  No acute distress,  Alert and oriented, Non-Toxic, Normal speech and affect Patient not examined on this visit. Mammogram: Not applicable due to age Assessment/Plan Morbid obesity with excess skin on the anterior abdominal wall and large breasts: Had a long discussion with [name] today regarding breast reductions and panniculectomy's.  We discussed the risks of wound  healing complications and loss of nipple due to loss of blood supply.  Specifically we discussed the fact that patients that are obese tend to have higher risks of all of these complications.  I have told her that currently the maximum BMI allowed for these procedures when I performed is a BMI of 40.  Anything lower than this enhances her chances of a uncomplicated recovery.  The patient understands and believes that she can continue to lose weight and meet this goal.  She would like to follow-up in 3 months to document her progress.  Will schedule her for a follow-up in 3 months.  30 minutes spent reviewing the patient's chart discussing the procedures with the patient and documenting care.  Santiago Glad 02/03/2023, 3:23 PM

## 2023-02-28 ENCOUNTER — Ambulatory Visit: Payer: Medicaid Other | Admitting: Obstetrics and Gynecology

## 2023-04-02 IMAGING — CR DG CHEST 2V
2 series · 2 of 2 positions shown · non-contrast
Comparison: 12/17/2019

CLINICAL DATA: Chest pain

EXAM:
CHEST - 2 VIEW

[chest pa]
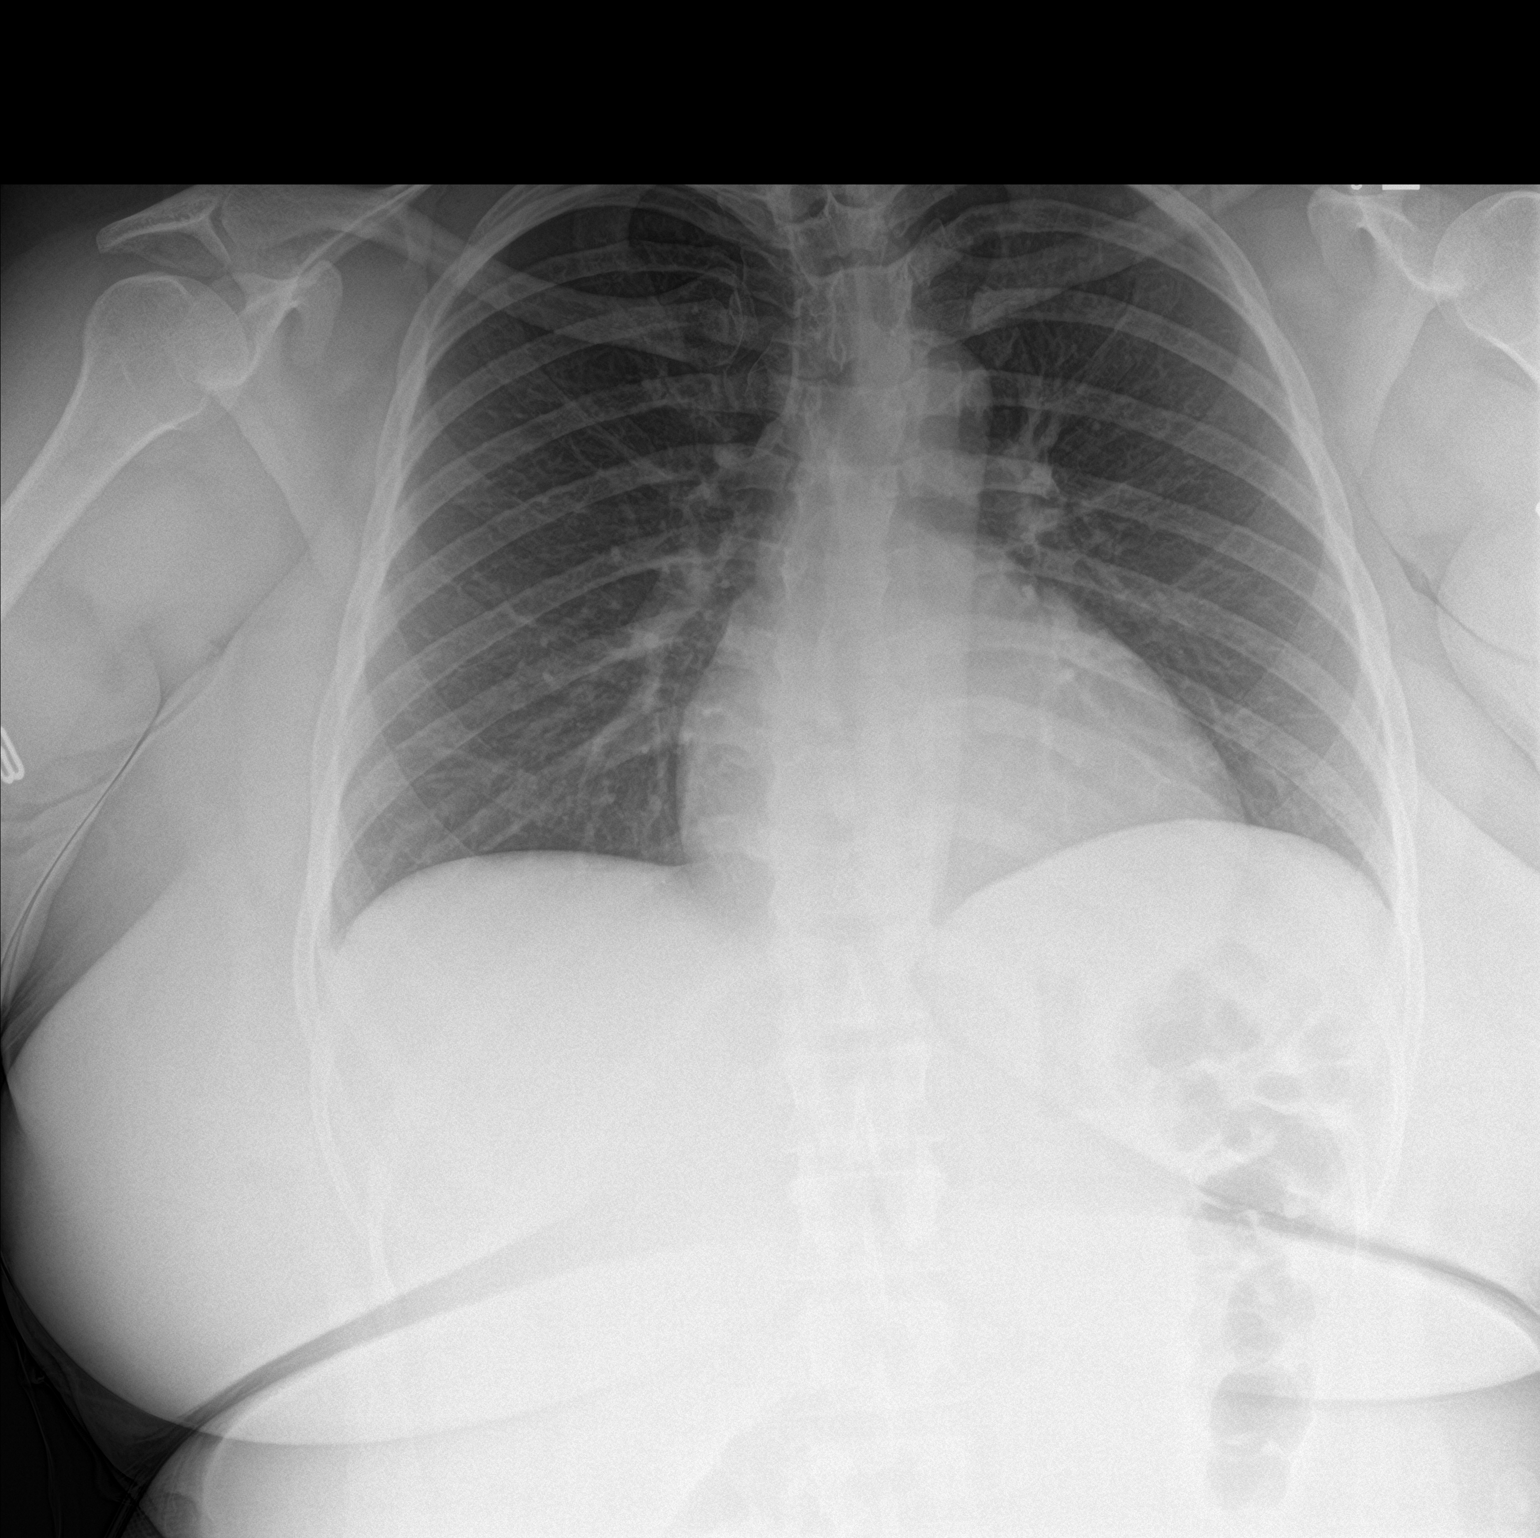

[chest lat]
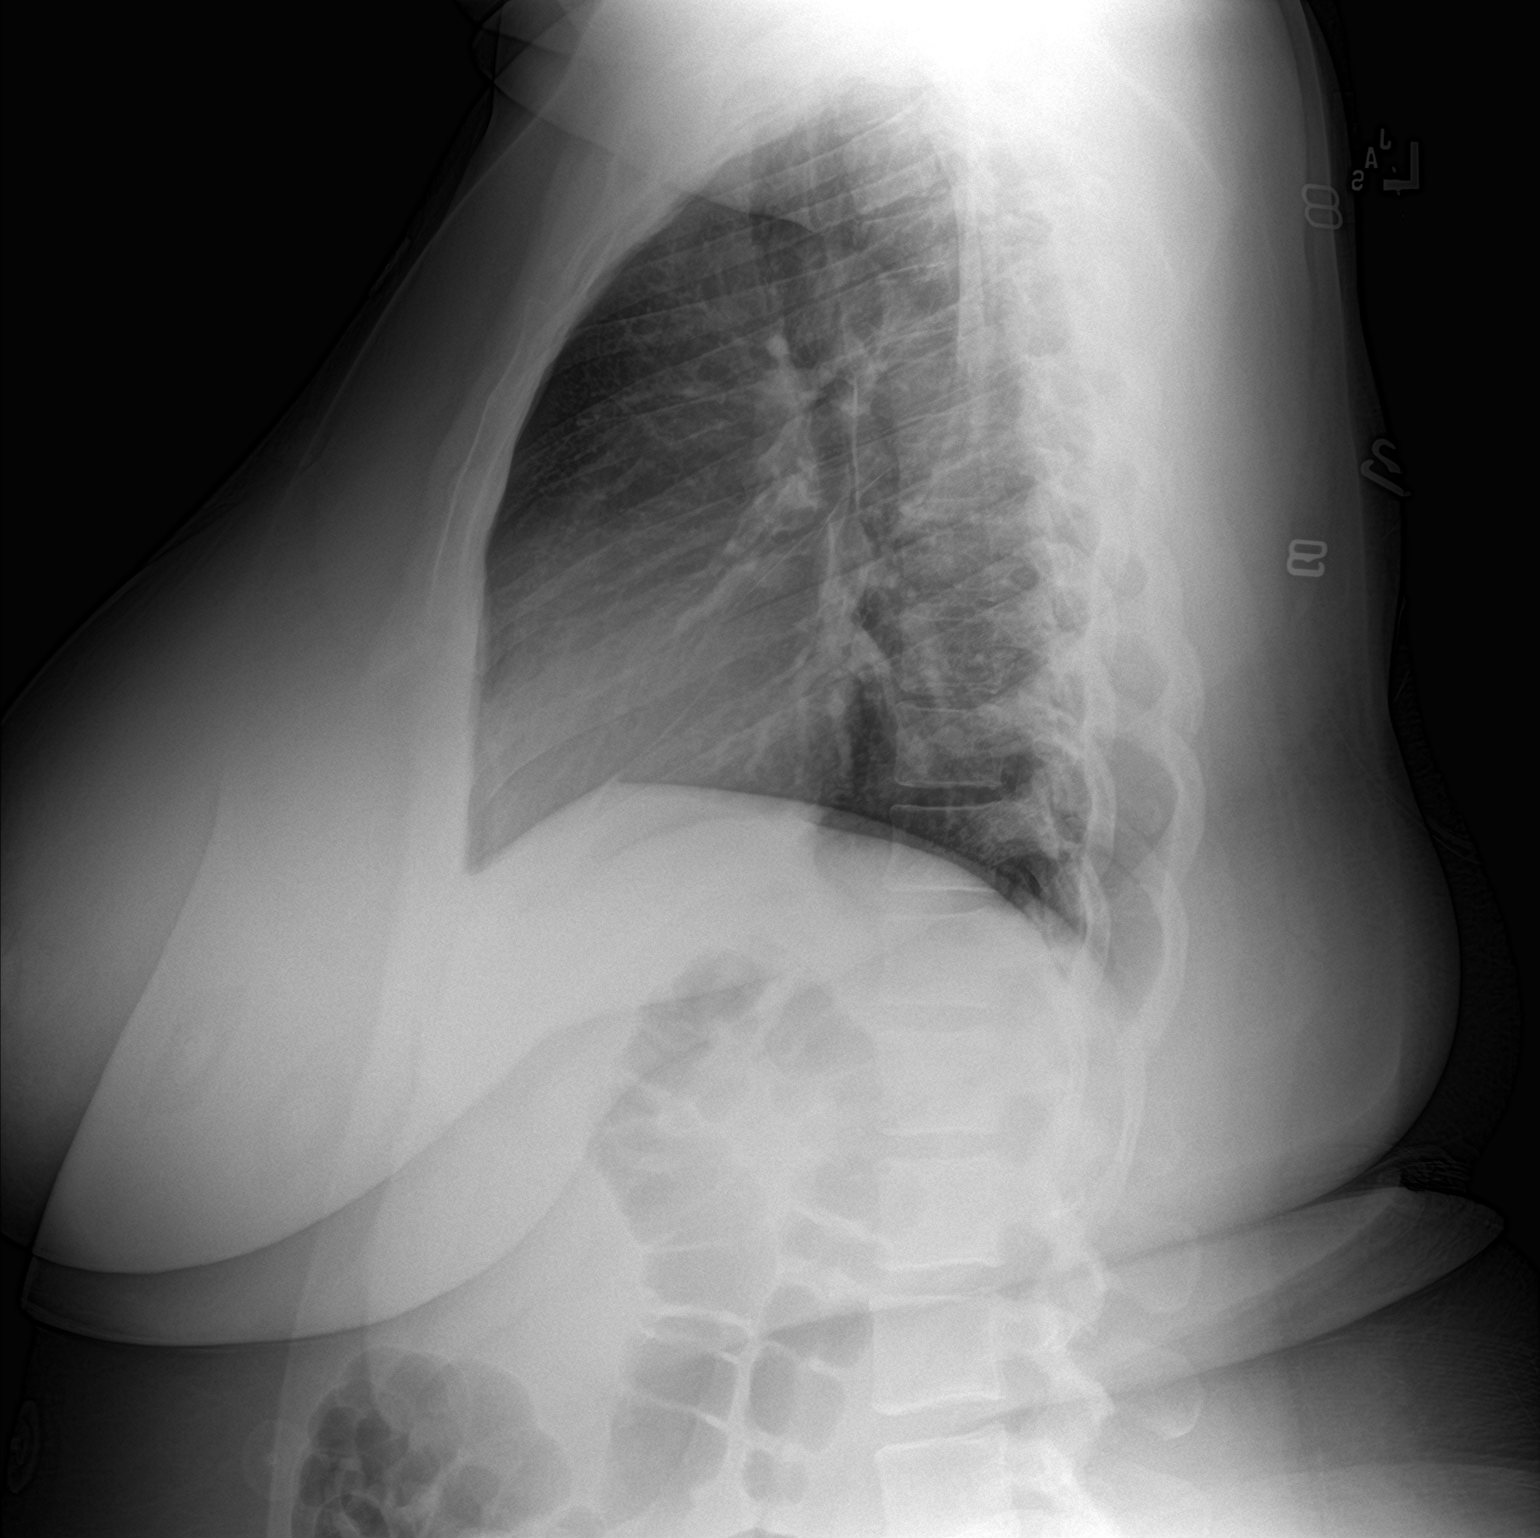

[2 of 2 positions shown; findings below may reference images not displayed]

FINDINGS: The heart size and mediastinal contours are within normal limits.
Both lungs are clear. The visualized skeletal structures are
unremarkable.
IMPRESSION: No active cardiopulmonary disease.

## 2023-04-13 ENCOUNTER — Ambulatory Visit: Payer: Medicaid Other | Admitting: Plastic Surgery

## 2023-05-18 ENCOUNTER — Ambulatory Visit: Admitting: Obstetrics and Gynecology

## 2023-05-18 NOTE — Progress Notes (Deleted)
    GYNECOLOGY VISIT  Patient name: Jennifer Booth MRN [id number]  Date of birth: 1985-11-28 Chief Complaint:   No chief complaint on file.   History:  Jennifer Booth is a 38 y.o. [phone] being seen today for ED follow up.   Seen in ED (02/23/23): epigastric abdominal pain; 6.1cm R ovarian cyst noted on imaging, bening appearing  Past Medical History:  Diagnosis Date   Anemia    History of blood transfusion 2018   post CSection   Pre-diabetes    Pre-eclampsia 2018    Past Surgical History:  Procedure Laterality Date   CESAREAN SECTION     CHOLECYSTECTOMY N/A 08/13/2020   Procedure: LAPAROSCOPIC CHOLECYSTECTOMY WITH INTRAOPERATIVE CHOLANGIOGRAM;  Surgeon: Darnell Level, MD;  Location: [facility];  Service: General;  Laterality: N/A;   LAPAROSCOPIC TUBAL LIGATION Bilateral 08/02/2018   Procedure: LAPAROSCOPIC TUBAL LIGATION WITH FILSHIE CLIPS;  Surgeon: Conan Bowens, MD;  Location: [facility];  Service: Gynecology;  Laterality: Bilateral;   MASS EXCISION Left 03/10/2021   Procedure: EXCISION LEFT UPPER BACK MASS;  Surgeon: Allena Napoleon, MD;  Location: [facility];  Service: Plastics;  Laterality: Left;  1 HOUR   NEVUS EXCISION Right 03/10/2021   Procedure: EXCISION RIGHT BUTTOCK SKIN LESION;  Surgeon: Allena Napoleon, MD;  Location: [facility];  Service: Plastics;  Laterality: Right;    The following portions of the patient's history were reviewed and updated as appropriate: allergies, current medications, past family history, past medical history, past social history, past surgical history and problem list.   Health Maintenance:   Last pap ***. Results were: {Pap findings:25134}. H/O abnormal pap: {yes/yes***/no:23866} Last mammogram: ***. Results were: {normal, abnormal, n/a:23837}. Family h/o breast cancer: {yes***/no:23838}   Review of Systems:  {Ros - complete:30496} Comprehensive review of systems was otherwise negative.   Objective:  Physical Exam There were no  vitals taken for this visit.   Physical Exam   Labs and Imaging No results found.     Assessment & Plan:   There are no diagnoses linked to this encounter.   *** Routine preventative health maintenance measures emphasized.  Lorriane Shire, MD [facility], [facility]

## 2023-05-24 NOTE — Progress Notes (Signed)
    GYNECOLOGY VISIT  Patient name: Jennifer Booth MRN [id number]  Date of birth: 11-Dec-1985 Chief Complaint:   No chief complaint on file.   History:  Jennifer Booth is a 38 y.o. [phone] being seen today for ED follow up.   Seen in ED (02/23/23): epigastric abdominal pain; 6.1cm R ovarian cyst noted on imaging, bening appearing  Past Medical History:  Diagnosis Date   Anemia    History of blood transfusion 2018   post CSection   Pre-diabetes    Pre-eclampsia 2018    Past Surgical History:  Procedure Laterality Date   CESAREAN SECTION     CHOLECYSTECTOMY N/A 08/13/2020   Procedure: LAPAROSCOPIC CHOLECYSTECTOMY WITH INTRAOPERATIVE CHOLANGIOGRAM;  Surgeon: Oralee Billow, MD;  Location: [facility];  Service: General;  Laterality: N/A;   LAPAROSCOPIC TUBAL LIGATION Bilateral 08/02/2018   Procedure: LAPAROSCOPIC TUBAL LIGATION WITH FILSHIE CLIPS;  Surgeon: Jan Mcgill, MD;  Location: [facility];  Service: Gynecology;  Laterality: Bilateral;   MASS EXCISION Left 03/10/2021   Procedure: EXCISION LEFT UPPER BACK MASS;  Surgeon: Barb Bonito, MD;  Location: [facility];  Service: Plastics;  Laterality: Left;  1 HOUR   NEVUS EXCISION Right 03/10/2021   Procedure: EXCISION RIGHT BUTTOCK SKIN LESION;  Surgeon: Barb Bonito, MD;  Location: [facility];  Service: Plastics;  Laterality: Right;    The following portions of the patient's history were reviewed and updated as appropriate: allergies, current medications, past family history, past medical history, past social history, past surgical history and problem list.   Health Maintenance:   Last pap ***. Results were: {Pap findings:25134}. H/O abnormal pap: {yes/yes***/no:23866} Last mammogram: ***. Results were: {normal, abnormal, n/a:23837}. Family h/o breast cancer: {yes***/no:23838}   Review of Systems:  {Ros - complete:30496} Comprehensive review of systems was otherwise negative.   Objective:  Physical Exam There were no  vitals taken for this visit.   Physical Exam   Labs and Imaging No results found.     Assessment & Plan:   There are no diagnoses linked to this encounter.   *** Routine preventative health maintenance measures emphasized.  Kiki Pelton, MD [facility], [facility]

## 2023-05-25 ENCOUNTER — Ambulatory Visit: Admitting: Obstetrics and Gynecology

## 2023-05-25 ENCOUNTER — Encounter: Payer: Self-pay | Admitting: Obstetrics and Gynecology

## 2023-05-25 ENCOUNTER — Other Ambulatory Visit (HOSPITAL_COMMUNITY)
Admission: RE | Admit: 2023-05-25 | Discharge: 2023-05-25 | Disposition: A | Discharge status: Home / Self Care | Source: Ambulatory Visit | Attending: Obstetrics and Gynecology | Admitting: Obstetrics and Gynecology

## 2023-05-25 VITALS — BP 162/115 | HR 90 | Ht 65.0 in | Wt 293.4 lb

## 2023-05-25 DIAGNOSIS — M791 Myalgia, unspecified site: Secondary | ICD-10-CM | POA: Diagnosis not present

## 2023-05-25 DIAGNOSIS — N939 Abnormal uterine and vaginal bleeding, unspecified: Secondary | ICD-10-CM | POA: Diagnosis not present

## 2023-05-25 DIAGNOSIS — N83201 Unspecified ovarian cyst, right side: Secondary | ICD-10-CM | POA: Diagnosis not present

## 2023-05-25 DIAGNOSIS — Z124 Encounter for screening for malignant neoplasm of cervix: Secondary | ICD-10-CM

## 2023-05-25 DIAGNOSIS — Z113 Encounter for screening for infections with a predominantly sexual mode of transmission: Secondary | ICD-10-CM

## 2023-05-25 DIAGNOSIS — R102 Pelvic and perineal pain: Secondary | ICD-10-CM | POA: Diagnosis not present

## 2023-05-25 MED ORDER — TRANEXAMIC ACID 650 MG PO TABS: 1300.0000 mg | ORAL_TABLET | Freq: Three times a day (TID) | ORAL | 2 refills | Status: AC

## 2023-05-25 MED ORDER — CYCLOBENZAPRINE HCL 10 MG PO TABS: 10.0000 mg | ORAL_TABLET | Freq: Three times a day (TID) | ORAL | 1 refills | Status: AC | PRN

## 2023-05-25 NOTE — Progress Notes (Unsigned)
 Pt presents for ED f/u 02-23-23. Pelvic US  and CT done. Pt states pain still present 5/10  Last PAP 08/2020 Requesting PAP and STD testing

## 2023-05-26 ENCOUNTER — Other Ambulatory Visit: Payer: Self-pay | Admitting: Obstetrics and Gynecology

## 2023-05-26 ENCOUNTER — Encounter: Payer: Self-pay | Admitting: Obstetrics and Gynecology

## 2023-05-26 DIAGNOSIS — Z1330 Encounter for screening examination for mental health and behavioral disorders, unspecified: Secondary | ICD-10-CM | POA: Diagnosis not present

## 2023-05-26 DIAGNOSIS — D171 Benign lipomatous neoplasm of skin and subcutaneous tissue of trunk: Secondary | ICD-10-CM | POA: Diagnosis not present

## 2023-05-26 DIAGNOSIS — E66813 Obesity, class 3: Secondary | ICD-10-CM | POA: Diagnosis not present

## 2023-05-26 DIAGNOSIS — B9689 Other specified bacterial agents as the cause of diseases classified elsewhere: Secondary | ICD-10-CM

## 2023-05-26 DIAGNOSIS — N83201 Unspecified ovarian cyst, right side: Secondary | ICD-10-CM | POA: Diagnosis not present

## 2023-05-26 DIAGNOSIS — D259 Leiomyoma of uterus, unspecified: Secondary | ICD-10-CM | POA: Diagnosis not present

## 2023-05-26 LAB — CERVICOVAGINAL ANCILLARY ONLY
Bacterial Vaginitis (gardnerella): POSITIVE — AB
Candida Glabrata: NEGATIVE
Candida Vaginitis: NEGATIVE
Chlamydia: NEGATIVE
Comment: NEGATIVE
Comment: NEGATIVE
Comment: NEGATIVE
Comment: NEGATIVE
Comment: NEGATIVE
Comment: NORMAL
Neisseria Gonorrhea: NEGATIVE
Trichomonas: NEGATIVE

## 2023-05-26 LAB — HIV ANTIBODY (ROUTINE TESTING W REFLEX): HIV Screen 4th Generation wRfx: NONREACTIVE

## 2023-05-26 LAB — HEPATITIS B SURFACE ANTIGEN: Hepatitis B Surface Ag: NEGATIVE

## 2023-05-26 LAB — RPR: RPR Ser Ql: NONREACTIVE

## 2023-05-26 LAB — HEPATITIS C ANTIBODY: Hep C Virus Ab: NONREACTIVE

## 2023-05-26 MED ORDER — METRONIDAZOLE 500 MG PO TABS: 500.0000 mg | ORAL_TABLET | Freq: Two times a day (BID) | ORAL | 0 refills | Status: AC

## 2023-05-31 LAB — CYTOLOGY - PAP
Comment: NEGATIVE
Diagnosis: NEGATIVE
High risk HPV: NEGATIVE

## 2023-06-01 ENCOUNTER — Ambulatory Visit: Admitting: Obstetrics and Gynecology

## 2023-06-08 ENCOUNTER — Ambulatory Visit: Admitting: Plastic Surgery

## 2023-06-20 DIAGNOSIS — Z6841 Body Mass Index (BMI) 40.0 and over, adult: Secondary | ICD-10-CM | POA: Diagnosis not present

## 2023-06-20 DIAGNOSIS — E66813 Obesity, class 3: Secondary | ICD-10-CM | POA: Diagnosis not present

## 2023-09-25 DIAGNOSIS — L7634 Postprocedural seroma of skin and subcutaneous tissue following other procedure: Secondary | ICD-10-CM | POA: Diagnosis not present

## 2023-09-25 DIAGNOSIS — Z79899 Other long term (current) drug therapy: Secondary | ICD-10-CM | POA: Diagnosis not present

## 2023-09-25 DIAGNOSIS — I10 Essential (primary) hypertension: Secondary | ICD-10-CM | POA: Diagnosis not present
